# Patient Record
Sex: Male | Born: 1975 | Race: White | Hispanic: No | Marital: Married | Smoking: Former smoker
Health system: Southern US, Community
[De-identification: ages and names within clinical notes are randomized; demographics above are authoritative.]

## PROBLEM LIST (undated history)

## (undated) DIAGNOSIS — E559 Vitamin D deficiency, unspecified: Secondary | ICD-10-CM

## (undated) DIAGNOSIS — E785 Hyperlipidemia, unspecified: Secondary | ICD-10-CM

## (undated) DIAGNOSIS — E669 Obesity, unspecified: Secondary | ICD-10-CM

## (undated) DIAGNOSIS — R7303 Prediabetes: Secondary | ICD-10-CM

## (undated) DIAGNOSIS — I10 Essential (primary) hypertension: Secondary | ICD-10-CM

## (undated) DIAGNOSIS — E291 Testicular hypofunction: Secondary | ICD-10-CM

## (undated) HISTORY — DX: Prediabetes: R73.03

## (undated) HISTORY — PX: CERVICAL FUSION: SHX112

## (undated) HISTORY — DX: Vitamin D deficiency, unspecified: E55.9

## (undated) HISTORY — DX: Essential (primary) hypertension: I10

## (undated) HISTORY — DX: Testicular hypofunction: E29.1

## (undated) HISTORY — DX: Obesity, unspecified: E66.9

## (undated) HISTORY — DX: Hyperlipidemia, unspecified: E78.5

---

## 1998-11-29 ENCOUNTER — Emergency Department (HOSPITAL_COMMUNITY): Admission: EM | Admit: 1998-11-29 | Discharge: 1998-11-29 | Payer: Self-pay | Admitting: Internal Medicine

## 1998-11-29 ENCOUNTER — Encounter: Payer: Self-pay | Admitting: Internal Medicine

## 2008-05-17 ENCOUNTER — Emergency Department (HOSPITAL_COMMUNITY): Admission: EM | Admit: 2008-05-17 | Discharge: 2008-05-17 | Payer: Self-pay | Admitting: Emergency Medicine

## 2008-05-24 ENCOUNTER — Emergency Department (HOSPITAL_COMMUNITY): Admission: EM | Admit: 2008-05-24 | Discharge: 2008-05-24 | Payer: Self-pay | Admitting: Emergency Medicine

## 2008-06-14 ENCOUNTER — Emergency Department (HOSPITAL_COMMUNITY): Admission: EM | Admit: 2008-06-14 | Discharge: 2008-06-14 | Payer: Self-pay | Admitting: Emergency Medicine

## 2010-12-07 ENCOUNTER — Other Ambulatory Visit (HOSPITAL_COMMUNITY): Payer: Self-pay | Admitting: Internal Medicine

## 2010-12-07 ENCOUNTER — Ambulatory Visit (HOSPITAL_COMMUNITY)
Admission: RE | Admit: 2010-12-07 | Discharge: 2010-12-07 | Disposition: A | Discharge status: Home / Self Care | Payer: BC Managed Care – PPO | Source: Ambulatory Visit | Attending: Internal Medicine | Admitting: Internal Medicine

## 2010-12-07 DIAGNOSIS — Z0000 Encounter for general adult medical examination without abnormal findings: Secondary | ICD-10-CM | POA: Insufficient documentation

## 2010-12-07 DIAGNOSIS — I10 Essential (primary) hypertension: Secondary | ICD-10-CM

## 2010-12-07 DIAGNOSIS — F17200 Nicotine dependence, unspecified, uncomplicated: Secondary | ICD-10-CM | POA: Insufficient documentation

## 2011-03-29 LAB — DIFFERENTIAL
Basophils Relative: 1
Eosinophils Absolute: 0.4
Monocytes Absolute: 0.9
Monocytes Relative: 9
Neutro Abs: 6.1

## 2011-03-29 LAB — CBC
Hemoglobin: 16.6
MCHC: 34
MCV: 89.9
RBC: 5.44

## 2011-03-29 LAB — BASIC METABOLIC PANEL
CO2: 26
Chloride: 102
GFR calc Af Amer: >60
Sodium: 138

## 2011-03-29 LAB — URINALYSIS, ROUTINE W REFLEX MICROSCOPIC
Bilirubin Urine: NEGATIVE
Hgb urine dipstick: NEGATIVE
Specific Gravity, Urine: 1.027
Urobilinogen, UA: 0.2

## 2011-03-29 LAB — URINE MICROSCOPIC-ADD ON

## 2012-05-03 ENCOUNTER — Ambulatory Visit (HOSPITAL_COMMUNITY)
Admission: RE | Admit: 2012-05-03 | Discharge: 2012-05-03 | Disposition: A | Discharge status: Home / Self Care | Payer: BC Managed Care – PPO | Source: Ambulatory Visit | Attending: Internal Medicine | Admitting: Internal Medicine

## 2012-05-03 ENCOUNTER — Other Ambulatory Visit (HOSPITAL_COMMUNITY): Payer: Self-pay | Admitting: Internal Medicine

## 2012-05-03 DIAGNOSIS — M545 Low back pain, unspecified: Secondary | ICD-10-CM

## 2013-05-06 ENCOUNTER — Encounter: Payer: Self-pay | Admitting: Internal Medicine

## 2013-05-06 DIAGNOSIS — R7303 Prediabetes: Secondary | ICD-10-CM

## 2013-05-06 DIAGNOSIS — E785 Hyperlipidemia, unspecified: Secondary | ICD-10-CM

## 2013-05-06 DIAGNOSIS — R7309 Other abnormal glucose: Secondary | ICD-10-CM | POA: Insufficient documentation

## 2013-05-06 DIAGNOSIS — E559 Vitamin D deficiency, unspecified: Secondary | ICD-10-CM

## 2013-05-06 DIAGNOSIS — E291 Testicular hypofunction: Secondary | ICD-10-CM | POA: Insufficient documentation

## 2013-05-06 DIAGNOSIS — I10 Essential (primary) hypertension: Secondary | ICD-10-CM

## 2013-05-06 DIAGNOSIS — E669 Obesity, unspecified: Secondary | ICD-10-CM

## 2013-05-07 ENCOUNTER — Encounter: Payer: Self-pay | Admitting: Physician Assistant

## 2013-05-07 ENCOUNTER — Ambulatory Visit: Payer: BC Managed Care – PPO | Admitting: Physician Assistant

## 2013-05-07 VITALS — BP 128/82 | HR 68 | Temp 98.2°F | Resp 16 | Wt 205.4 lb

## 2013-05-07 DIAGNOSIS — I10 Essential (primary) hypertension: Secondary | ICD-10-CM

## 2013-05-07 DIAGNOSIS — E559 Vitamin D deficiency, unspecified: Secondary | ICD-10-CM

## 2013-05-07 DIAGNOSIS — E291 Testicular hypofunction: Secondary | ICD-10-CM

## 2013-05-07 DIAGNOSIS — E669 Obesity, unspecified: Secondary | ICD-10-CM

## 2013-05-07 DIAGNOSIS — E785 Hyperlipidemia, unspecified: Secondary | ICD-10-CM

## 2013-05-07 DIAGNOSIS — R7303 Prediabetes: Secondary | ICD-10-CM

## 2013-05-07 LAB — BASIC METABOLIC PANEL WITH GFR
CO2: 27 mEq/L (ref 19–32)
Chloride: 104 mEq/L (ref 96–112)
Glucose, Bld: 91 mg/dL (ref 70–99)
Potassium: 5.2 mEq/L (ref 3.5–5.3)
Sodium: 137 mEq/L (ref 135–145)

## 2013-05-07 LAB — CBC WITH DIFFERENTIAL/PLATELET
Hemoglobin: 15.5 g/dL (ref 13.0–17.0)
Lymphocytes Relative: 31 % (ref 12–46)
Lymphs Abs: 2.8 10*3/uL (ref 0.7–4.0)
MCH: 31.4 pg (ref 26.0–34.0)
Monocytes Relative: 10 % (ref 3–12)
Neutro Abs: 5.2 10*3/uL (ref 1.7–7.7)
Neutrophils Relative %: 56 % (ref 43–77)
RBC: 4.94 MIL/uL (ref 4.22–5.81)
WBC: 9.2 10*3/uL (ref 4.0–10.5)

## 2013-05-07 LAB — HEPATIC FUNCTION PANEL
ALT: 29 U/L (ref 0–53)
Albumin: 4.7 g/dL (ref 3.5–5.2)
Total Protein: 7.4 g/dL (ref 6.0–8.3)

## 2013-05-07 LAB — LIPID PANEL
Cholesterol: 186 mg/dL (ref 0–200)
HDL: 35 mg/dL — ABNORMAL LOW (ref >39)
Triglycerides: 479 mg/dL — ABNORMAL HIGH (ref <150)

## 2013-05-07 NOTE — Patient Instructions (Signed)
You AIC is in prediabetic range which is between 5.7 and 6.4. This is a warning sign for diabetes. Your A1C is a measure of your sugar over the past 3 months and is not affected by what you have eaten over the past few days. Diabetes increases your chances of stroke and heart attack over 300 % and is the leading cause of blindness and kidney failure in the [place]. Please make sure you decrease bad carbs like white bread, white rice, potatoes, corn, soft drinks, pasta, cereals, refined sugars, sweet tea, dried fruits, and fruit juice. Good carbs are okay to eat in moderation like sweet potatoes, brown rice, whole grain pasta/bread, most fruit (expect dried fruit) and you can eat as many veggies as you want.   Testosterone This test is used to determine if your testosterone level is abnormal. This could be used to explain difficulty getting an erection (erectile dysfunction), inability of your partner to get pregnant (infertility), premature or delayed puberty if you are male, or the appearance of masculine physical features if you are male. PREPARATION FOR TEST A blood sample is obtained by inserting a needle into a vein in the arm. NORMAL FINDINGS  Free Testosterone: 0.3-2 pg/mL  % Free Testosterone: 0.1%-0.3% Total Testosterone:  7 mos-9 yrs (Tanner Stage I)  Male: Less than 30 ng/dL  Male: Less than 30 ng/dL  16-10 yrs (Tanner Stage II)  Male: Less than 300 ng/dL  Male: Less than 40 ng/dL  96-04 yrs (Tanner Stage III)  Male: 170-540 ng/dL  Male: Less than 60 ng/dL  54-09 yrs (Tanner Stage IV, V)  Male: 250-910 ng/dL  Male: Less than 70 ng/dL  20 yrs and over  Male: [phone] ng/dL  Male: Less than 70 ng/dL Ranges for normal findings may vary among different laboratories and hospitals. You should always check with your doctor after having lab work or other tests done to discuss the meaning of your test results and whether your values are considered within  normal limits. MEANING OF TEST  Your caregiver will go over the test results with you and discuss the importance and meaning of your results, as well as treatment options and the need for additional tests if necessary. OBTAINING THE TEST RESULTS It is your responsibility to obtain your test results. Ask the lab or department performing the test when and how you will get your results. Document Released: 06/29/2004 Document Revised: 09/04/2011 Document Reviewed: 05/24/2008 [facility] Patient Information 2014 [place], [place].

## 2013-05-07 NOTE — Progress Notes (Addendum)
HPI Patient presents for 3 month follow up with hypertension, hyperlipidemia, prediabetes and vitamin D.  Patient's blood pressure has been controlled at home. Patient denies chest pain, shortness of breath, dizziness.  Patient's cholesterol is diet controlled and is on pravastatin and denies myalgias.  he has  been working on diet and exercise for prediabetes, denies changes in vision, polys, and paresthesias.   Patient is on Vitamin D supplement.  Surgery on Cervical spine 7 weeks ago and went back after two weeks. No more radicular pain.  Has been out of testosterone for 3 months and he can tell a difference in his energy level.  Current Medications:    Medication List       This list is accurate as of: 05/07/13  4:35 PM.  Always use your most recent med list.               cholecalciferol 1000 UNITS tablet  Commonly known as:  VITAMIN D  Take 10,000 Units by mouth daily.     fish oil-omega-3 fatty acids 1000 MG capsule  Take 2 g by mouth daily.     Flaxseed Oil 1000 MG Caps  Take by mouth.     lisinopril 40 MG tablet  Commonly known as:  PRINIVIL,ZESTRIL  Take 40 mg by mouth daily.     pravastatin 40 MG tablet  Commonly known as:  PRAVACHOL  Take 40 mg by mouth daily.     tadalafil 20 MG tablet  Commonly known as:  CIALIS  Take 20 mg by mouth daily as needed for erectile dysfunction.     testosterone cypionate 100 MG/ML injection  Commonly known as:  DEPOTESTOTERONE CYPIONATE  Inject 200 mg into the muscle as directed. For IM use only       Allergies: Allergies no known allergies  ROS Constitutional: Denies fever, chills, weight loss/gain, headaches, insomnia, fatigue, night sweats, and change in appetite. Eyes: Denies redness, blurred vision, diplopia, discharge, itchy, watery eyes.  ENT: Denies discharge, congestion, post nasal drip, sore throat, earache, dental pain, Tinnitus, Vertigo, Sinus pain, snoring.  Cardio: Denies chest pain, palpitations, irregular  heartbeat,  dyspnea, diaphoresis, orthopnea, PND, claudication, edema Respiratory: denies cough, dyspnea,pleurisy, hoarseness, wheezing.  Gastrointestinal: Denies dysphagia, heartburn,  water brash, pain, cramps, nausea, vomiting, bloating, diarrhea, constipation, hematemesis, melena, hematochezia,  hemorrhoids Genitourinary: Denies dysuria, frequency, urgency, nocturia, hesitancy, discharge, hematuria, flank pain Musculoskeletal: Denies arthralgia, myalgia, stiffness, Jt. Swelling, pain, limp, and strain/sprain. Skin: Denies puritis, rash, hives, warts, acne, eczema, changing in skin lesion Neuro: Weakness, tremor, incoordination, spasms, paresthesia, pain Psychiatric: Denies confusion, memory loss, sensory loss Endocrine: Denies change in weight, skin, hair change, nocturia, and paresthesia, Diabetic Polys, visual blurring, hyper /hypo glycemic episodes.  Heme/Lymph: Excessive bleeding, bruising, enlarged lymph nodes Medical History:  Past Medical History  Diagnosis Date  . Hypertension   . Hyperlipidemia   . Prediabetes   . Hypogonadism male   . Vitamin D deficiency   . Obesity    Family history- Review and unchanged Social history- Review and unchanged Physical Exam: There were no vitals filed for this visit. There were no vitals filed for this visit. General Appearance: Well nourished, in no apparent distress. Eyes: PERRLA, EOMs, conjunctiva no swelling or erythema, normal fundi and vessels. Sinuses: No Frontal/maxillary tenderness ENT/Mouth: Ext aud canals clear, with TMs without erythema, bulging.No erythema, swelling, or exudate on post pharynx.  Tonsils not swollen or erythematous. Hearing normal.  Neck: Supple, thyroid normal.  Respiratory: Respiratory effort normal, BS  equal bilaterally without rales, rhonci, wheezing or stridor.  Cardio: Heart sounds normal, regular rate and rhythm without murmurs, rubs or gallops. Peripheral pulses brisk and equal bilaterally, without  edema.  Abdomen: Flat, soft, with bowel sounds. Nontender, no guarding, rebound, hernias, masses, or organomegaly.  Lymphatics: Non tender without lymphadenopathy.  Musculoskeletal: Full ROM all peripheral extremities, joint stability, 5/5 strength, and normal gait. Skin: Warm, dry without rashes, lesions, ecchymosis.  Neuro: Cranial nerves intact, reflexes equal bilaterally. Normal muscle tone, no cerebellar symptoms. Sensation intact.  Pysch: Awake and oriented X 3, normal affect, Insight and Judgment appropriate.   Assessement and Plan:  Hypertension: Continue medication, monitor blood pressure at home. Continue DASH diet. Cholesterol: Continue diet and exercise. Check cholesterol. Has been off fenofibrate for one month will check Trigs.  Pre-diabetes-Continue diet and exercise.  Vitamin D Def- check level and continue medications.  Obesity- discussed weight loss Hypogondism- check levels if low call back in Dtest.   Quentin Mulling 4:35 PM   Patient's labs shows that his [place] are still elevated so we call him in Fenofibrate and his testosterone was low and he is having symptoms to that was also called in.

## 2013-05-08 LAB — TESTOSTERONE: Testosterone: 268 ng/dL — ABNORMAL LOW (ref 300–890)

## 2013-05-08 LAB — VITAMIN D 25 HYDROXY (VIT D DEFICIENCY, FRACTURES): Vit D, 25-Hydroxy: 72 ng/mL (ref 30–89)

## 2013-05-08 MED ORDER — TESTOSTERONE CYPIONATE 100 MG/ML IM SOLN: INTRAMUSCULAR | Status: DC

## 2013-05-08 MED ORDER — FENOFIBRATE MICRONIZED 134 MG PO CAPS: 134.0000 mg | ORAL_CAPSULE | Freq: Every day | ORAL | Status: DC

## 2013-05-08 NOTE — Addendum Note (Signed)
Addended by: Quentin Mulling R on: 05/08/2013 07:38 AM   Modules accepted: Orders

## 2013-05-12 ENCOUNTER — Other Ambulatory Visit: Payer: Self-pay | Admitting: Internal Medicine

## 2013-05-12 ENCOUNTER — Encounter: Payer: Self-pay | Admitting: Internal Medicine

## 2013-05-12 MED ORDER — TESTOSTERONE CYPIONATE 200 MG/ML IM SOLN: INTRAMUSCULAR | Status: DC

## 2013-07-02 ENCOUNTER — Encounter: Payer: Self-pay | Admitting: Internal Medicine

## 2013-07-02 ENCOUNTER — Ambulatory Visit (INDEPENDENT_AMBULATORY_CARE_PROVIDER_SITE_OTHER): Payer: BC Managed Care – PPO | Admitting: Internal Medicine

## 2013-07-02 VITALS — BP 108/68 | HR 76 | Temp 98.1°F | Resp 16 | Wt 204.8 lb

## 2013-07-02 DIAGNOSIS — E291 Testicular hypofunction: Secondary | ICD-10-CM

## 2013-07-02 DIAGNOSIS — E559 Vitamin D deficiency, unspecified: Secondary | ICD-10-CM

## 2013-07-02 DIAGNOSIS — E782 Mixed hyperlipidemia: Secondary | ICD-10-CM

## 2013-07-02 DIAGNOSIS — I10 Essential (primary) hypertension: Secondary | ICD-10-CM

## 2013-07-02 DIAGNOSIS — R7309 Other abnormal glucose: Secondary | ICD-10-CM

## 2013-07-02 DIAGNOSIS — Z79899 Other long term (current) drug therapy: Secondary | ICD-10-CM

## 2013-07-02 LAB — CBC WITH DIFFERENTIAL/PLATELET
Basophils Absolute: 0.1 10*3/uL (ref 0.0–0.1)
Basophils Relative: 1 % (ref 0–1)
EOS PCT: 2 % (ref 0–5)
Eosinophils Absolute: 0.1 10*3/uL (ref 0.0–0.7)
HEMATOCRIT: 45 % (ref 39.0–52.0)
Hemoglobin: 15.6 g/dL (ref 13.0–17.0)
LYMPHS ABS: 2.3 10*3/uL (ref 0.7–4.0)
LYMPHS PCT: 26 % (ref 12–46)
MCH: 30.4 pg (ref 26.0–34.0)
MCHC: 34.7 g/dL (ref 30.0–36.0)
MCV: 87.5 fL (ref 78.0–100.0)
MONO ABS: 1 10*3/uL (ref 0.1–1.0)
Monocytes Relative: 11 % (ref 3–12)
Neutro Abs: 5.3 10*3/uL (ref 1.7–7.7)
Neutrophils Relative %: 60 % (ref 43–77)
Platelets: 304 10*3/uL (ref 150–400)
RBC: 5.14 MIL/uL (ref 4.22–5.81)
RDW: 12.6 % (ref 11.5–15.5)
WBC: 8.7 10*3/uL (ref 4.0–10.5)

## 2013-07-02 NOTE — Patient Instructions (Signed)
Testosterone This test is used to determine if your testosterone level is abnormal. This could be used to explain difficulty getting an erection (erectile dysfunction), inability of your partner to get pregnant (infertility), premature or delayed puberty if you are male, or the appearance of masculine physical features if you are male. PREPARATION FOR TEST A blood sample is obtained by inserting a needle into a vein in the arm. NORMAL FINDINGS  Free Testosterone: 0.3-2 pg/mL  % Free Testosterone: 0.1%-0.3% Total Testosterone:  7 mos-9 yrs (Tanner Stage I)  Male: Less than 30 ng/dL  Male: Less than 30 ng/dL  10-13 yrs (Tanner Stage II)  Male: Less than 300 ng/dL  Male: Less than 40 ng/dL  14-15 yrs (Tanner Stage III)  Male: 170-540 ng/dL  Male: Less than 60 ng/dL  16-19 yrs (Tanner Stage IV, V)  Male: 250-910 ng/dL  Male: Less than 70 ng/dL  20 yrs and over  Male: [phone] ng/dL  Male: Less than 70 ng/dL Ranges for normal findings may vary among different laboratories and hospitals. You should always check with your doctor after having lab work or other tests done to discuss the meaning of your test results and whether your values are considered within normal limits. MEANING OF TEST  Your caregiver will go over the test results with you and discuss the importance and meaning of your results, as well as treatment options and the need for additional tests if necessary. OBTAINING THE TEST RESULTS It is your responsibility to obtain your test results. Ask the lab or department performing the test when and how you will get your results. Document Released: 06/29/2004 Document Revised: 09/04/2011 Document Reviewed: 05/24/2008 [facility] Patient Information 2014 [place], [place].  Testosterone injection What is this medicine? TESTOSTERONE (tes TOS ter one) is the main male hormone. It supports normal male development such as muscle growth, facial hair, and deep  voice. It is used in males to treat low testosterone levels. This medicine may be used for other purposes; ask your health care provider or pharmacist if you have questions. COMMON BRAND NAME(S): Andro-L.A., Aveed, Delatestryl, Depo-Testosterone, Virilon What should I tell my health care provider before I take this medicine? They need to know if you have any of these conditions: -breast cancer -diabetes -heart disease -kidney disease -liver disease -lung disease -prostate cancer, enlargement -an unusual or allergic reaction to testosterone, other medicines, foods, dyes, or preservatives -pregnant or trying to get pregnant -breast-feeding How should I use this medicine? This medicine is for injection into a muscle. It is usually given by a health care professional in a hospital or clinic setting. Contact your pediatrician regarding the use of this medicine in children. While this medicine may be prescribed for children as young as 17 years of age for selected conditions, precautions do apply. Overdosage: If you think you have taken too much of this medicine contact a poison control center or emergency room at once. NOTE: This medicine is only for you. Do not share this medicine with others. What if I miss a dose? Try not to miss a dose. Your doctor or health care professional will tell you when your next injection is due. Notify the office if you are unable to keep an appointment. What may interact with this medicine? -medicines for diabetes -medicines that treat or prevent blood clots like warfarin -oxyphenbutazone -propranolol -steroid medicines like prednisone or cortisone This list may not describe all possible interactions. Give your health care provider a list of all the medicines, herbs,  non-prescription drugs, or dietary supplements you use. Also tell them if you smoke, drink alcohol, or use illegal drugs. Some items may interact with your medicine. What should I watch for while  using this medicine? Visit your doctor or health care professional for regular checks on your progress. They will need to check the level of testosterone in your blood. This medicine may affect blood sugar levels. If you have diabetes, check with your doctor or health care professional before you change your diet or the dose of your diabetic medicine. This drug is banned from use in athletes by most athletic organizations. What side effects may I notice from receiving this medicine? Side effects that you should report to your doctor or health care professional as soon as possible: -allergic reactions like skin rash, itching or hives, swelling of the face, lips, or tongue -breast enlargement -breathing problems -changes in mood, especially anger, depression, or rage -dark urine -general ill feeling or flu-like symptoms -light-colored stools -loss of appetite, nausea -nausea, vomiting -right upper belly pain -stomach pain -swelling of ankles -too frequent or persistent erections -trouble passing urine or change in the amount of urine -unusually weak or tired -yellowing of the eyes or skin Additional side effects that can occur in women include: -deep or hoarse voice -facial hair growth -irregular menstrual periods Side effects that usually do not require medical attention (report to your doctor or health care professional if they continue or are bothersome): -acne -change in sex drive or performance -hair loss -headache This list may not describe all possible side effects. Call your doctor for medical advice about side effects. You may report side effects to FDA at 1-800-FDA-1088. Where should I keep my medicine? Keep out of the reach of children. This medicine can be abused. Keep your medicine in a safe place to protect it from theft. Do not share this medicine with anyone. Selling or giving away this medicine is dangerous and against the law. Store at room temperature between 20 and  25 degrees C (68 and 77 degrees F). Do not freeze. Protect from light. Follow the directions for the product you are prescribed. Throw away any unused medicine after the expiration date. NOTE: This sheet is a summary. It may not cover all possible information. If you have questions about this medicine, talk to your doctor, pharmacist, or health care provider.  2014, Elsevier/Gold Standard. (2007-08-23 16:13:46)  Hypertension As your heart beats, it forces blood through your arteries. This force is your blood pressure. If the pressure is too high, it is called hypertension (HTN) or high blood pressure. HTN is dangerous because you may have it and not know it. High blood pressure may mean that your heart has to work harder to pump blood. Your arteries may be narrow or stiff. The extra work puts you at risk for heart disease, stroke, and other problems.  Blood pressure consists of two numbers, a higher number over a lower, 110/72, for example. It is stated as "110 over 72." The ideal is below 120 for the top number (systolic) and under 80 for the bottom (diastolic). Write down your blood pressure today. You should pay close attention to your blood pressure if you have certain conditions such as:  Heart failure.  Prior heart attack.  Diabetes  Chronic kidney disease.  Prior stroke.  Multiple risk factors for heart disease. To see if you have HTN, your blood pressure should be measured while you are seated with your arm held at the level  of the heart. It should be measured at least twice. A one-time elevated blood pressure reading (especially in the Emergency Department) does not mean that you need treatment. There may be conditions in which the blood pressure is different between your right and left arms. It is important to see your caregiver soon for a recheck. Most people have essential hypertension which means that there is not a specific cause. This type of high blood pressure may be lowered by  changing lifestyle factors such as:  Stress.  Smoking.  Lack of exercise.  Excessive weight.  Drug/tobacco/alcohol use.  Eating less salt. Most people do not have symptoms from high blood pressure until it has caused damage to the body. Effective treatment can often prevent, delay or reduce that damage. TREATMENT  When a cause has been identified, treatment for high blood pressure is directed at the cause. There are a large number of medications to treat HTN. These fall into several categories, and your caregiver will help you select the medicines that are best for you. Medications may have side effects. You should review side effects with your caregiver. If your blood pressure stays high after you have made lifestyle changes or started on medicines,   Your medication(s) may need to be changed.  Other problems may need to be addressed.  Be certain you understand your prescriptions, and know how and when to take your medicine.  Be sure to follow up with your caregiver within the time frame advised (usually within two weeks) to have your blood pressure rechecked and to review your medications.  If you are taking more than one medicine to lower your blood pressure, make sure you know how and at what times they should be taken. Taking two medicines at the same time can result in blood pressure that is too low. SEEK IMMEDIATE MEDICAL CARE IF:  You develop a severe headache, blurred or changing vision, or confusion.  You have unusual weakness or numbness, or a faint feeling.  You have severe chest or abdominal pain, vomiting, or breathing problems. MAKE SURE YOU:   Understand these instructions.  Will watch your condition.  Will get help right away if you are not doing well or get worse. Document Released: 06/12/2005 Document Revised: 09/04/2011 Document Reviewed: 01/31/2008 [facility] Patient Information 2014 [place].  Diabetes and Exercise Exercising regularly is  important. It is not just about losing weight. It has many health benefits, such as:  Improving your overall fitness, flexibility, and endurance.  Increasing your bone density.  Helping with weight control.  Decreasing your body fat.  Increasing your muscle strength.  Reducing stress and tension.  Improving your overall health. People with diabetes who exercise gain additional benefits because exercise:  Reduces appetite.  Improves the body's use of blood sugar (glucose).  Helps lower or control blood glucose.  Decreases blood pressure.  Helps control blood lipids (such as cholesterol and triglycerides).  Improves the body's use of the hormone insulin by:  Increasing the body's insulin sensitivity.  Reducing the body's insulin needs.  Decreases the risk for heart disease because exercising:  Lowers cholesterol and triglycerides levels.  Increases the levels of good cholesterol (such as high-density lipoproteins [HDL]) in the body.  Lowers blood glucose levels. YOUR ACTIVITY PLAN  Choose an activity that you enjoy and set realistic goals. Your health care provider or diabetes educator can help you make an activity plan that works for you. You can break activities into 2 or 3 sessions throughout the  day. Doing so is as good as one long session. Exercise ideas include:  Taking the dog for a walk.  Taking the stairs instead of the elevator.  Dancing to your favorite song.  Doing your favorite exercise with a friend. RECOMMENDATIONS FOR EXERCISING WITH TYPE 1 OR TYPE 2 DIABETES   Check your blood glucose before exercising. If blood glucose levels are greater than 240 mg/dL, check for urine ketones. Do not exercise if ketones are present.  Avoid injecting insulin into areas of the body that are going to be exercised. For example, avoid injecting insulin into:  The arms when playing tennis.  The legs when jogging.  Keep a record of:  Food intake before and after  you exercise.  Expected peak times of insulin action.  Blood glucose levels before and after you exercise.  The type and amount of exercise you have done.  Review your records with your health care provider. Your health care provider will help you to develop guidelines for adjusting food intake and insulin amounts before and after exercising.  If you take insulin or oral hypoglycemic agents, watch for signs and symptoms of hypoglycemia. They include:  Dizziness.  Shaking.  Sweating.  Chills.  Confusion.  Drink plenty of water while you exercise to prevent dehydration or heat stroke. Body water is lost during exercise and must be replaced.  Talk to your health care provider before starting an exercise program to make sure it is safe for you. Remember, almost any type of activity is better than none. Document Released: 09/02/2003 Document Revised: 02/12/2013 Document Reviewed: 11/19/2012 [facility] Patient Information 2014 [place], [place].  Cholesterol Cholesterol is a white, waxy, fat-like protein needed by your body in small amounts. The liver makes all the cholesterol you need. It is carried from the liver by the blood through the blood vessels. Deposits (plaque) may build up on blood vessel walls. This makes the arteries narrower and stiffer. Plaque increases the risk for heart attack and stroke. You cannot feel your cholesterol level even if it is very high. The only way to know is by a blood test to check your lipid (fats) levels. Once you know your cholesterol levels, you should keep a record of the test results. Work with your caregiver to to keep your levels in the desired range. WHAT THE RESULTS MEAN:  Total cholesterol is a rough measure of all the cholesterol in your blood.  LDL is the so-called bad cholesterol. This is the type that deposits cholesterol in the walls of the arteries. You want this level to be low.  HDL is the good cholesterol because it cleans the  arteries and carries the LDL away. You want this level to be high.  Triglycerides are fat that the body can either burn for energy or store. High levels are closely linked to heart disease. DESIRED LEVELS:  Total cholesterol below 200.  LDL below 100 for people at risk, below 70 for very high risk.  HDL above 50 is good, above 60 is best.  Triglycerides below 150. HOW TO LOWER YOUR CHOLESTEROL:  Diet.  Choose fish or white meat chicken and [place], roasted or baked. Limit fatty cuts of red meat, fried foods, and processed meats, such as sausage and lunch meat.  Eat lots of fresh fruits and vegetables. Choose whole grains, beans, pasta, potatoes and cereals.  Use only small amounts of olive, corn or canola oils. Avoid butter, mayonnaise, shortening or palm kernel oils. Avoid foods with trans-fats.  Use  skim/nonfat milk and low-fat/nonfat yogurt and cheeses. Avoid whole milk, cream, ice cream, egg yolks and cheeses. Healthy desserts include angel food cake, ginger snaps, animal crackers, hard candy, popsicles, and low-fat/nonfat frozen yogurt. Avoid pastries, cakes, pies and cookies.  Exercise.  A regular program helps decrease LDL and raises HDL.  Helps with weight control.  Do things that increase your activity level like gardening, walking, or taking the stairs.  Medication.  May be prescribed by your caregiver to help lowering cholesterol and the risk for heart disease.  You may need medicine even if your levels are normal if you have several risk factors. HOME CARE INSTRUCTIONS   Follow your diet and exercise programs as suggested by your caregiver.  Take medications as directed.  Have blood work done when your caregiver feels it is necessary. MAKE SURE YOU:   Understand these instructions.  Will watch your condition.  Will get help right away if you are not doing well or get worse. Document Released: 03/07/2001 Document Revised: 09/04/2011 Document Reviewed:  08/28/2007 [facility] Patient Information 2014 [place], [place].  Vitamin D Deficiency Vitamin D is an important vitamin that your body needs. Having too little of it in your body is called a deficiency. A very bad deficiency can make your bones soft and can cause a condition called rickets.  Vitamin D is important to your body for different reasons, such as:   It helps your body absorb 2 minerals called calcium and phosphorus.  It helps make your bones healthy.  It may prevent some diseases, such as diabetes and multiple sclerosis.  It helps your muscles and heart. You can get vitamin D in several ways. It is a natural part of some foods. The vitamin is also added to some dairy products and cereals. Some people take vitamin D supplements. Also, your body makes vitamin D when you are in the sun. It changes the sun's rays into a form of the vitamin that your body can use. CAUSES   Not eating enough foods that contain vitamin D.  Not getting enough sunlight.  Having certain digestive system diseases that make it hard to absorb vitamin D. These diseases include Crohn's disease, chronic pancreatitis, and cystic fibrosis.  Having a surgery in which part of the stomach or small intestine is removed.  Being obese. Fat cells pull vitamin D out of your blood. That means that obese people may not have enough vitamin D left in their blood and in other body tissues.  Having chronic kidney or liver disease. RISK FACTORS Risk factors are things that make you more likely to develop a vitamin D deficiency. They include:  Being older.  Not being able to get outside very much.  Living in a nursing home.  Having had broken bones.  Having weak or thin bones (osteoporosis).  Having a disease or condition that changes how your body absorbs vitamin D.  Having dark skin.  Some medicines such as seizure medicines or steroids.  Being overweight or obese. SYMPTOMS Mild cases of vitamin D  deficiency may not have any symptoms. If you have a very bad case, symptoms may include:  Bone pain.  Muscle pain.  Falling often.  Broken bones caused by a minor injury, due to osteoporosis. DIAGNOSIS A blood test is the best way to tell if you have a vitamin D deficiency. TREATMENT Vitamin D deficiency can be treated in different ways. Treatment for vitamin D deficiency depends on what is causing it. Options include:  Taking  vitamin D supplements.  Taking a calcium supplement. Your caregiver will suggest what dose is best for you. HOME CARE INSTRUCTIONS  Take any supplements that your caregiver prescribes. Follow the directions carefully. Take only the suggested amount.  Have your blood tested 2 months after you start taking supplements.  Eat foods that contain vitamin D. Healthy choices include:  Fortified dairy products, cereals, or juices. Fortified means vitamin D has been added to the food. Check the label on the package to be sure.  Fatty fish like salmon or trout.  Eggs.  Oysters.  Do not use a tanning bed.  Keep your weight at a healthy level. Lose weight if you need to.  Keep all follow-up appointments. Your caregiver will need to perform blood tests to make sure your vitamin D deficiency is going away. SEEK MEDICAL CARE IF:  You have any questions about your treatment.  You continue to have symptoms of vitamin D deficiency.  You have nausea or vomiting.  You are constipated.  You feel confused.  You have severe abdominal or back pain. MAKE SURE YOU:  Understand these instructions.  Will watch your condition.  Will get help right away if you are not doing well or get worse. Document Released: 09/04/2011 Document Revised: 10/07/2012 Document Reviewed: 09/04/2011 [facility] Patient Information 2014 [place].

## 2013-07-02 NOTE — Progress Notes (Signed)
Patient ID: Neil Henderson, male   DOB: 1976-03-25, 38 y.o.   MRN: [id number]   This very nice 38 y.o. MWM presents for 3 month follow up with Hypertension, Hyperlipidemia, Testosterone Deficiency, Pre-Diabetes and Vitamin D Deficiency.    His labile HTN predates since 2007 and is being monitored expectantly. Today's BP: 108/68 mmHg . Patient denies any cardiac type chest pain, palpitations, dyspnea/orthopnea/PND, dizziness, claudication, or dependent edema.   Hyperlipidemia is controlled with diet & meds. Last Cholesterol was 154, Triglycerides were 209, HDL 30 and LDL 82 - at goal in June. Patient denies myalgias or other med SE's.    Patient has Testosterone Deficiency and is on parenteral replacement therapy and reports improved sense of well- being on therapy. He does report he has been off treatment about 6 weeks.   Also, the patient has history of PreDiabetes with A1c 5.7% in March 2013 and with last A1c of 5.1% in June 2014. Patient denies any symptoms of reactive hypoglycemia, diabetic polys, paresthesias or visual blurring.   Further, Patient has history of Vitamin D Deficiency of 13 in 2008 with last vitamin D of 89 in June 2014. Patient supplements Vitamin D without any suspected side-effects.  Medication Sig Dispense Refill  . cholecalciferol (VITAMIN D) 1000 UNITS tablet Take 10,000 Units by mouth daily.      . fenofibrate micronized (LOFIBRA) 134 MG capsule Take 1 capsule (134 mg total) by mouth daily before breakfast.  90 capsule  1  . lisinopril (PRINIVIL,ZESTRIL) 40 MG tablet Take 40 mg by mouth daily.      . pravastatin (PRAVACHOL) 40 MG tablet Take 40 mg by mouth daily.      . tadalafil (CIALIS) 20 MG tablet Take 20 mg by mouth daily as needed for erectile dysfunction.      Marland Kitchen testosterone cypionate (DEPO-TESTOSTERONE) 200 MG/ML injection 2 cc IM  Q2wk.s  And 3cc syringes and 1"  21 G. needles  10 mL  5     No Known Allergies  PMHx:   Past Medical History  Diagnosis Date   . Hypertension   . Hyperlipidemia   . Prediabetes   . Hypogonadism male   . Vitamin D deficiency   . Obesity     FHx:    Reviewed / unchanged  SHx:    Reviewed / unchanged  Systems Review: Constitutional: Denies fever, chills, wt changes, headaches, insomnia, fatigue, night sweats, change in appetite. Eyes: Denies redness, blurred vision, diplopia, discharge, itchy, watery eyes.  ENT: Denies discharge, congestion, post nasal drip, epistaxis, sore throat, earache, hearing loss, dental pain, tinnitus, vertigo, sinus pain, snoring.  CV: Denies chest pain, palpitations, irregular heartbeat, syncope, dyspnea, diaphoresis, orthopnea, PND, claudication, edema. Respiratory: denies cough, dyspnea, DOE, pleurisy, hoarseness, laryngitis, wheezing.  Gastrointestinal: Denies dysphagia, odynophagia, heartburn, reflux, water brash, abdominal pain or cramps, nausea, vomiting, bloating, diarrhea, constipation, hematemesis, melena, hematochezia,  or hemorrhoids. Genitourinary: Denies dysuria, frequency, urgency, nocturia, hesitancy, discharge, hematuria, flank pain. Musculoskeletal: Denies arthralgias, myalgias, stiffness, jt. swelling, pain, limp, strain/sprain.  Skin: Denies pruritus, rash, hives, warts, acne, eczema, change in skin lesion(s). Neuro: No weakness, tremor, incoordination, spasms, paresthesia, or pain. Psychiatric: Denies confusion, memory loss, or sensory loss. Endo: Denies change in weight, skin, hair change.  Heme/Lymph: No excessive bleeding, bruising, orenlarged lymph nodes.  BP: 108/68  Pulse: 76  Temp: 98.1 F (36.7 C)  Resp: 16   On Exam: Appears well nourished - in no distress. Eyes: PERRLA, EOMs, conjunctiva no swelling or erythema. Sinuses:  No frontal/maxillary tenderness ENT/Mouth: EAC's clear, TM's nl w/o erythema, bulging. Nares clear w/o erythema, swelling, exudates. Oropharynx clear without erythema or exudates. Oral hygiene is good. Tongue normal, non  obstructing. Hearing intact.  Neck: Supple. Thyroid nl. Car 2+/2+ without bruits, nodes or JVD. Chest: Respirations nl with BS clear & equal w/o rales, rhonchi, wheezing or stridor.  Cor: Heart sounds normal w/ regular rate and rhythm without sig. murmurs, gallops, clicks, or rubs. Peripheral pulses normal and equal  without edema.  Abdomen: Soft & bowel sounds normal. Non-tender w/o guarding, rebound, hernias, masses, or organomegaly.  Lymphatics: Unremarkable.  Musculoskeletal: Full ROM all peripheral extremities, joint stability, 5/5 strength, and normal gait.  Skin: Warm, dry without exposed rashes, lesions, ecchymosis apparent.  Neuro: Cranial nerves intact, reflexes equal bilaterally. Sensory-motor testing grossly intact. Tendon reflexes grossly intact.  Pysch: Alert & oriented x 3. Insight and judgement nl & appropriate. No ideations.  Assessment and Plan:  1. Hypertension - Continue monitor blood pressure at home. Continue diet & exercise same.  2. Hyperlipidemia - Continue diet/meds, exercise,& lifestyle modifications. Continue monitor periodic cholesterol/liver & renal functions   3. Pre-diabetes - Continue diet, exercise, lifestyle modifications. Monitor appropriate labs.  4. Vitamin D Deficiency - Continue supplementation.  5. Testosterone Deficiency  Recommended regular exercise, BP monitoring, weight control, and discussed med and SE's. Recommended labs to assess and monitor clinical status. Further disposition pending results of labs.

## 2013-07-03 LAB — MAGNESIUM: MAGNESIUM: 2 mg/dL (ref 1.5–2.5)

## 2013-07-03 LAB — LIPID PANEL
CHOL/HDL RATIO: 4.4 ratio
Cholesterol: 182 mg/dL (ref 0–200)
HDL: 41 mg/dL (ref >39)
LDL Cholesterol: 99 mg/dL (ref 0–99)
Triglycerides: 208 mg/dL — ABNORMAL HIGH (ref <150)
VLDL: 42 mg/dL — ABNORMAL HIGH (ref 0–40)

## 2013-07-03 LAB — HEMOGLOBIN A1C
HEMOGLOBIN A1C: 5.5 % (ref <5.7)
Mean Plasma Glucose: 111 mg/dL (ref <117)

## 2013-07-03 LAB — BASIC METABOLIC PANEL WITH GFR
BUN: 18 mg/dL (ref 6–23)
CHLORIDE: 105 meq/L (ref 96–112)
CO2: 27 meq/L (ref 19–32)
CREATININE: 1.08 mg/dL (ref 0.50–1.35)
Calcium: 10.3 mg/dL (ref 8.4–10.5)
GFR, Est African American: >89 mL/min
GFR, Est Non African American: 86 mL/min
Glucose, Bld: 84 mg/dL (ref 70–99)
Potassium: 4.5 mEq/L (ref 3.5–5.3)
SODIUM: 139 meq/L (ref 135–145)

## 2013-07-03 LAB — HEPATIC FUNCTION PANEL
ALBUMIN: 4.6 g/dL (ref 3.5–5.2)
ALK PHOS: 54 U/L (ref 39–117)
ALT: 33 U/L (ref 0–53)
AST: 23 U/L (ref 0–37)
BILIRUBIN TOTAL: 0.4 mg/dL (ref 0.3–1.2)
Bilirubin, Direct: 0.1 mg/dL (ref 0.0–0.3)
Indirect Bilirubin: 0.3 mg/dL (ref 0.0–0.9)
Total Protein: 7 g/dL (ref 6.0–8.3)

## 2013-07-03 LAB — TSH: TSH: 1.522 u[IU]/mL (ref 0.350–4.500)

## 2013-07-03 LAB — INSULIN, FASTING: INSULIN FASTING, SERUM: 37 u[IU]/mL — AB (ref 3–28)

## 2013-07-03 LAB — VITAMIN D 25 HYDROXY (VIT D DEFICIENCY, FRACTURES): VIT D 25 HYDROXY: 67 ng/mL (ref 30–89)

## 2013-08-23 ENCOUNTER — Other Ambulatory Visit: Payer: Self-pay | Admitting: Physician Assistant

## 2013-08-25 ENCOUNTER — Other Ambulatory Visit: Payer: Self-pay

## 2013-08-25 MED ORDER — TESTOSTERONE CYPIONATE 200 MG/ML IM SOLN: INTRAMUSCULAR | Status: DC

## 2013-10-01 ENCOUNTER — Ambulatory Visit (INDEPENDENT_AMBULATORY_CARE_PROVIDER_SITE_OTHER): Payer: BC Managed Care – PPO | Admitting: Emergency Medicine

## 2013-10-01 ENCOUNTER — Encounter: Payer: Self-pay | Admitting: Emergency Medicine

## 2013-10-01 VITALS — BP 124/62 | HR 84 | Temp 98.2°F | Resp 18 | Ht 69.0 in | Wt 211.0 lb

## 2013-10-01 DIAGNOSIS — R51 Headache: Secondary | ICD-10-CM

## 2013-10-01 DIAGNOSIS — I10 Essential (primary) hypertension: Secondary | ICD-10-CM

## 2013-10-01 DIAGNOSIS — F17200 Nicotine dependence, unspecified, uncomplicated: Secondary | ICD-10-CM

## 2013-10-01 DIAGNOSIS — E782 Mixed hyperlipidemia: Secondary | ICD-10-CM

## 2013-10-01 DIAGNOSIS — R7309 Other abnormal glucose: Secondary | ICD-10-CM

## 2013-10-01 LAB — CBC WITH DIFFERENTIAL/PLATELET
BASOS ABS: 0.1 10*3/uL (ref 0.0–0.1)
BASOS PCT: 1 % (ref 0–1)
EOS ABS: 0.2 10*3/uL (ref 0.0–0.7)
Eosinophils Relative: 2 % (ref 0–5)
HCT: 44.2 % (ref 39.0–52.0)
Hemoglobin: 14.8 g/dL (ref 13.0–17.0)
LYMPHS ABS: 2.4 10*3/uL (ref 0.7–4.0)
Lymphocytes Relative: 28 % (ref 12–46)
MCH: 29.9 pg (ref 26.0–34.0)
MCHC: 33.5 g/dL (ref 30.0–36.0)
MCV: 89.3 fL (ref 78.0–100.0)
Monocytes Absolute: 0.9 10*3/uL (ref 0.1–1.0)
Monocytes Relative: 10 % (ref 3–12)
NEUTROS PCT: 59 % (ref 43–77)
Neutro Abs: 5.1 10*3/uL (ref 1.7–7.7)
PLATELETS: 327 10*3/uL (ref 150–400)
RBC: 4.95 MIL/uL (ref 4.22–5.81)
RDW: 12.9 % (ref 11.5–15.5)
WBC: 8.6 10*3/uL (ref 4.0–10.5)

## 2013-10-01 MED ORDER — VARENICLINE TARTRATE 1 MG PO TABS: 1.0000 mg | ORAL_TABLET | Freq: Two times a day (BID) | ORAL | Status: DC

## 2013-10-01 NOTE — Patient Instructions (Signed)
Headache and Arthritis °Headaches and arthritis are common problems. This causes an interest in the possible role of arthritis in causing headaches. Several major forms of arthritis exist. Two of the most common types are: °· Rheumatoid arthritis. °· Osteoarthritis. °Rheumatoid arthritis may begin at any age. It is a condition in which the body attacks some of its own tissues, thinking they do not belong. This leads to destruction of the bony areas around the joints. This condition may afflict any of the body's joints. It usually produces a deformity of the joint. The hands and fingers no longer appear straight but often appear angled towards one side. In some cases, the spine may be involved. Most often it is the vertebrae of the neck (cervicalspine). The areas of the neck most commonly afflicted by rheumatoid arthritis are the first and second cervical vertebrae. Curiously, rheumatoid arthritis, though it often produces severe deformities, is not always painful.  °The more common form of arthritis is osteoarthritis. It is a wear-and-tear form of arthritis. It usually does not produce deformity of the joints or destruction of the bony tissues. Rather the ligaments weaken. They may be calcified due to the body's attempt to heal the damage. The larger joints of the body and those joints that take the most stress and strain are the most often affected. In the neck region this osteoarthritis usually involves the fifth, sixth and seventh vertebrae. This is because the effects of posture produce the most fatigue on them. Osteoarthritis is often more painful than rheumatoid arthritis.  °During workups for arthritis, a test evaluating inflammation, (the sedimentation rate) often is performed. In rheumatoid arthritis, this test will usually be elevated. Other tests for inflammation may also be elevated. In patients with osteoarthritis, x-rays of the neck or jaw joints will show changes from "lipping" of the vertebrae. This  is caused by calcium deposits in the ligaments. Or they may show narrowing of the space between the vertebrae, or spur formation (from calcium deposits). If severe, it may cause obstruction of the holes where the nerves pass from the spine to the body. In rheumatoid arthritis, dislocation of vertebrae may occur in the upper neck. CT scan and MRI in patients with osteoarthritis may show bulging of the discs that cushion the vertebrae. In the most severe cases, herniation of the discs may occur.  °Headaches, felt as a pain in the neck, may be caused by arthritis if the first, second or third vertebrae are involved. This condition is due to the nerves that supply the scalp only originating from this area of the spine. Neck pain itself, whether alone or coupled with headaches, can involve any portion of the neck. If the jaw is involved, the symptoms are similar to those of Temporomandibular Joint Syndrome (TMJ).  °The progressive severity of rheumatoid arthritis may be slowed by a variety of potent medications. In osteoarthritis, its progression is not usually hindered by medication. The following may be helpful in slowing the advancement of the disorder: °· Lifestyle adjustment. °· Exercise. °· Rest. °· Weight loss. °Medications, such as the nonsteroidal anti-inflammatory agents (NSAIDs), are useful. They may reduce the pain and improve the reduced motion which occurs in joints afflicted by arthritis. From some studies, the use of acetaminophen appears to be as effective in controlling the pain of arthritis as the NSAIDs. Physical modalities may also be useful for arthritis. They include: °· Heat. °· Massage. °· Exercise. °But physical therapy must be prescribed by a caregiver, just as most medications for   arthritis.  Document Released: 09/02/2003 Document Revised: 09/04/2011 Document Reviewed: 01/30/2008 [facility] Patient Information 2014 [place], [place]. Smoking Cessation, Tips for Success If you are ready to quit  smoking, congratulations! You have chosen to help yourself be healthier. Cigarettes bring nicotine, tar, carbon monoxide, and other irritants into your body. Your lungs, heart, and blood vessels will be able to work better without these poisons. There are many different ways to quit smoking. Nicotine gum, nicotine patches, a nicotine inhaler, or nicotine nasal spray can help with physical craving. Hypnosis, support groups, and medicines help break the habit of smoking. WHAT THINGS CAN I DO TO MAKE QUITTING EASIER?  Here are some tips to help you quit for good:  Pick a date when you will quit smoking completely. Tell all of your friends and family about your plan to quit on that date.  Do not try to slowly cut down on the number of cigarettes you are smoking. Pick a quit date and quit smoking completely starting on that day.  Throw away all cigarettes.   Clean and remove all ashtrays from your home, work, and car.   On a card, write down your reasons for quitting. Carry the card with you and read it when you get the urge to smoke.   Cleanse your body of nicotine. Drink enough water and fluids to keep your urine clear or pale yellow. Do this after quitting to flush the nicotine from your body.   Learn to predict your moods. Do not let a bad situation be your excuse to have a cigarette. Some situations in your life might tempt you into wanting a cigarette.   Never have "just one" cigarette. It leads to wanting another and another. Remind yourself of your decision to quit.   Change habits associated with smoking. If you smoked while driving or when feeling stressed, try other activities to replace smoking. Stand up when drinking your coffee. Brush your teeth after eating. Sit in a different chair when you read the paper. Avoid alcohol while trying to quit, and try to drink fewer caffeinated beverages. Alcohol and caffeine may urge you to smoke.   Avoid foods and drinks that can trigger a  desire to smoke, such as sugary or spicy foods and alcohol.   Ask people who smoke not to smoke around you.   Have something planned to do right after eating or having a cup of coffee. For example, plan to take a walk or exercise.   Try a relaxation exercise to calm you down and decrease your stress. Remember, you may be tense and nervous for the first 2 weeks after you quit, but this will pass.   Find new activities to keep your hands busy. Play with a pen, coin, or rubber band. Doodle or draw things on paper.   Brush your teeth right after eating. This will help cut down on the craving for the taste of tobacco after meals. You can also try mouthwash.   Use oral substitutes in place of cigarettes. Try using lemon drops, carrots, cinnamon sticks, or chewing gum. Keep them handy so they are available when you have the urge to smoke.   When you have the urge to smoke, try deep breathing.   Designate your home as a nonsmoking area.   If you are a heavy smoker, ask your health care provider about a prescription for nicotine chewing gum. It can ease your withdrawal from nicotine.   Reward yourself. Set aside the cigarette money you  save and buy yourself something nice.   Look for support from others. Join a support group or smoking cessation program. Ask someone at home or at work to help you with your plan to quit smoking.   Always ask yourself, "Do I need this cigarette or is this just a reflex?" Tell yourself, "Today, I choose not to smoke," or "I do not want to smoke." You are reminding yourself of your decision to quit.  Do not replace cigarette smoking with electronic cigarettes (commonly called e-cigarettes). The safety of e-cigarettes is unknown, and some may contain harmful chemicals.  If you relapse, do not give up! Plan ahead and think about what you will do the next time you get the urge to smoke.  HOW WILL I FEEL WHEN I QUIT SMOKING? You may have symptoms of  withdrawal because your body is used to nicotine (the addictive substance in cigarettes). You may crave cigarettes, be irritable, feel very hungry, cough often, get headaches, or have difficulty concentrating. The withdrawal symptoms are only temporary. They are strongest when you first quit but will go away within 10 14 days. When withdrawal symptoms occur, stay in control. Think about your reasons for quitting. Remind yourself that these are signs that your body is healing and getting used to being without cigarettes. Remember that withdrawal symptoms are easier to treat than the major diseases that smoking can cause.  Even after the withdrawal is over, expect periodic urges to smoke. However, these cravings are generally short lived and will go away whether you smoke or not. Do not smoke!  WHAT RESOURCES ARE AVAILABLE TO HELP ME QUIT SMOKING? Your health care provider can direct you to community resources or hospitals for support, which may include:  Group support.  Education.  Hypnosis.  Therapy. Document Released: 03/10/2004 Document Revised: 04/02/2013 Document Reviewed: 11/28/2012 [facility] Patient Information 2014 [place], [place].

## 2013-10-01 NOTE — Progress Notes (Signed)
Subjective:    Patient ID: Neil Henderson, male    DOB: 07/14/1975, 38 y.o.   MRN: [id number]  HPI Comments: 38 yo male presents for 3 month F/U for HTN, Cholesterol, Pre-Dm, D. Deficient. He is is not eating healthy. He has not been exercising over the last month. He has gained 10 # since last visit. He has noted headaches daily x 2 weeks. He has congestion and advil helps some. He also has neck history and thinks he may be sleeping funny causing neck discomfort.   Hyperlipidemia  Hypertension Associated symptoms include neck pain.  Last labs INSULIN 37 A1C 5.5 T 182 TG 208 L 99  Current Outpatient Prescriptions on File Prior to Visit  Medication Sig Dispense Refill  . cholecalciferol (VITAMIN D) 1000 UNITS tablet Take 10,000 Units by mouth daily.      . fenofibrate micronized (LOFIBRA) 134 MG capsule Take 1 capsule (134 mg total) by mouth daily before breakfast.  90 capsule  1  . lisinopril (PRINIVIL,ZESTRIL) 40 MG tablet Take one tablet by mouth one time daily  90 tablet  2  . pravastatin (PRAVACHOL) 40 MG tablet Take 40 mg by mouth daily.      Marland Kitchen testosterone cypionate (DEPO-TESTOSTERONE) 200 MG/ML injection 2 cc IM  Q2wk.s  And 3cc syringes and 1"  21 G. needles  10 mL  5   No current facility-administered medications on file prior to visit.   No Known Allergies Past Medical History  Diagnosis Date  . Hypertension   . Hyperlipidemia   . Prediabetes   . Hypogonadism male   . Vitamin D deficiency   . Obesity       Review of Systems  HENT: Positive for congestion and postnasal drip.   Musculoskeletal: Positive for neck pain.  All other systems reviewed and are negative.  BP 124/62  Pulse 84  Temp(Src) 98.2 F (36.8 C) (Temporal)  Resp 18  Ht 5\' 9"  (1.753 m)  Wt 211 lb (95.709 kg)  BMI 31.15 kg/m2     Objective:   Physical Exam  Nursing note and vitals reviewed. Constitutional: He is oriented to person, place, and time. He appears well-developed and  well-nourished.  HENT:  Head: Normocephalic and atraumatic.  Right Ear: External ear normal.  Left Ear: External ear normal.  Nose: Nose normal.  Mouth/Throat: No oropharyngeal exudate.  Cloudy TM's bilaterally, Cobblestones posterior pharynx   Eyes: Conjunctivae and EOM are normal.  Neck: Normal range of motion. Neck supple. No JVD present. No thyromegaly present.  Cardiovascular: Normal rate, regular rhythm, normal heart sounds and intact distal pulses.   Pulmonary/Chest: Effort normal and breath sounds normal.  Abdominal: Soft. Bowel sounds are normal. He exhibits no distension and no mass. There is no tenderness. There is no rebound and no guarding.  Musculoskeletal: Normal range of motion. He exhibits no edema and no tenderness.  Lymphadenopathy:    He has no cervical adenopathy.  Neurological: He is alert and oriented to person, place, and time. He has normal reflexes. No cranial nerve deficit. Coordination normal.  Skin: Skin is warm and dry.  Psychiatric: He has a normal mood and affect. His behavior is normal. Judgment and thought content normal.          Assessment & Plan:  1.  3 month F/U for HTN, Cholesterol, Pre-Dm, D. Deficient. Needs healthy diet, cardio QD and obtain healthy weight. Check Labs, Check BP if >130/80 call office  2. HA? Neck HX vs Allergic  rhinitis- Allegra OTC, increase H2o, allergy hygiene explained. Neck roll pillow suggested, w/c if SX increase or ER.  3. Tobacco Dep- advised cessation techniques and need for d/c to decrease Risk. CHANTIX 1 MG bid GIVEN FOR RESTART, PATIENT AWARE OF SE

## 2013-10-02 LAB — HEPATIC FUNCTION PANEL
ALK PHOS: 52 U/L (ref 39–117)
ALT: 24 U/L (ref 0–53)
AST: 21 U/L (ref 0–37)
Albumin: 4.2 g/dL (ref 3.5–5.2)
BILIRUBIN INDIRECT: 0.4 mg/dL (ref 0.2–1.2)
Bilirubin, Direct: 0.1 mg/dL (ref 0.0–0.3)
TOTAL PROTEIN: 6.6 g/dL (ref 6.0–8.3)
Total Bilirubin: 0.5 mg/dL (ref 0.2–1.2)

## 2013-10-02 LAB — HEMOGLOBIN A1C
HEMOGLOBIN A1C: 5.5 % (ref <5.7)
Mean Plasma Glucose: 111 mg/dL (ref <117)

## 2013-10-02 LAB — BASIC METABOLIC PANEL WITH GFR
BUN: 16 mg/dL (ref 6–23)
CALCIUM: 10.1 mg/dL (ref 8.4–10.5)
CHLORIDE: 99 meq/L (ref 96–112)
CO2: 26 meq/L (ref 19–32)
Creat: 1.15 mg/dL (ref 0.50–1.35)
GFR, EST NON AFRICAN AMERICAN: 80 mL/min
GFR, Est African American: >89 mL/min
Glucose, Bld: 86 mg/dL (ref 70–99)
Potassium: 4.3 mEq/L (ref 3.5–5.3)
SODIUM: 135 meq/L (ref 135–145)

## 2013-10-02 LAB — LIPID PANEL
Cholesterol: 151 mg/dL (ref 0–200)
HDL: 33 mg/dL — AB (ref >39)
LDL CALC: 63 mg/dL (ref 0–99)
Total CHOL/HDL Ratio: 4.6 Ratio
Triglycerides: 275 mg/dL — ABNORMAL HIGH (ref <150)
VLDL: 55 mg/dL — ABNORMAL HIGH (ref 0–40)

## 2013-10-02 LAB — INSULIN, FASTING: INSULIN FASTING, SERUM: 176 u[IU]/mL — AB (ref 3–28)

## 2013-12-20 ENCOUNTER — Other Ambulatory Visit: Payer: Self-pay | Admitting: Internal Medicine

## 2013-12-20 ENCOUNTER — Other Ambulatory Visit: Payer: Self-pay | Admitting: Physician Assistant

## 2013-12-24 ENCOUNTER — Ambulatory Visit (INDEPENDENT_AMBULATORY_CARE_PROVIDER_SITE_OTHER): Payer: BC Managed Care – PPO | Admitting: Internal Medicine

## 2013-12-24 ENCOUNTER — Encounter: Payer: Self-pay | Admitting: Internal Medicine

## 2013-12-24 VITALS — BP 120/82 | HR 76 | Temp 99.1°F | Resp 16 | Ht 69.5 in | Wt 223.6 lb

## 2013-12-24 DIAGNOSIS — Z0000 Encounter for general adult medical examination without abnormal findings: Secondary | ICD-10-CM

## 2013-12-24 DIAGNOSIS — R7309 Other abnormal glucose: Secondary | ICD-10-CM

## 2013-12-24 DIAGNOSIS — E559 Vitamin D deficiency, unspecified: Secondary | ICD-10-CM

## 2013-12-24 DIAGNOSIS — Z111 Encounter for screening for respiratory tuberculosis: Secondary | ICD-10-CM

## 2013-12-24 DIAGNOSIS — Z113 Encounter for screening for infections with a predominantly sexual mode of transmission: Secondary | ICD-10-CM

## 2013-12-24 DIAGNOSIS — R740 Nonspecific elevation of levels of transaminase and lactic acid dehydrogenase [LDH]: Secondary | ICD-10-CM

## 2013-12-24 DIAGNOSIS — R7401 Elevation of levels of liver transaminase levels: Secondary | ICD-10-CM

## 2013-12-24 DIAGNOSIS — Z125 Encounter for screening for malignant neoplasm of prostate: Secondary | ICD-10-CM

## 2013-12-24 DIAGNOSIS — Z79899 Other long term (current) drug therapy: Secondary | ICD-10-CM | POA: Insufficient documentation

## 2013-12-24 DIAGNOSIS — R7402 Elevation of levels of lactic acid dehydrogenase (LDH): Secondary | ICD-10-CM

## 2013-12-24 DIAGNOSIS — I10 Essential (primary) hypertension: Secondary | ICD-10-CM

## 2013-12-24 DIAGNOSIS — Z1212 Encounter for screening for malignant neoplasm of rectum: Secondary | ICD-10-CM

## 2013-12-24 LAB — CBC WITH DIFFERENTIAL/PLATELET
BASOS ABS: 0.1 10*3/uL (ref 0.0–0.1)
BASOS PCT: 1 % (ref 0–1)
EOS ABS: 0.2 10*3/uL (ref 0.0–0.7)
Eosinophils Relative: 3 % (ref 0–5)
HCT: 43 % (ref 39.0–52.0)
Hemoglobin: 14.9 g/dL (ref 13.0–17.0)
Lymphocytes Relative: 19 % (ref 12–46)
Lymphs Abs: 1.5 10*3/uL (ref 0.7–4.0)
MCH: 30 pg (ref 26.0–34.0)
MCHC: 34.7 g/dL (ref 30.0–36.0)
MCV: 86.7 fL (ref 78.0–100.0)
Monocytes Absolute: 0.6 10*3/uL (ref 0.1–1.0)
Monocytes Relative: 8 % (ref 3–12)
NEUTROS ABS: 5.5 10*3/uL (ref 1.7–7.7)
NEUTROS PCT: 69 % (ref 43–77)
PLATELETS: 315 10*3/uL (ref 150–400)
RBC: 4.96 MIL/uL (ref 4.22–5.81)
RDW: 13 % (ref 11.5–15.5)
WBC: 7.9 10*3/uL (ref 4.0–10.5)

## 2013-12-24 NOTE — Progress Notes (Signed)
Patient ID: Neil Henderson, male   DOB: January 18, 1976, 38 y.o.   MRN: [id number]   Annual Screening Comprehensive Examination  This very nice 38 y.o.MWM presents for complete physical.  Patient has been followed for HTN, Prediabetes, Hyperlipidemia, Testosterone and Vitamin D Deficiency.   HTN predates since 2007. Patient's BP has been controlled at home.Today's BP: 120/82 mmHg. Patient denies any cardiac symptoms as chest pain, palpitations, shortness of breath, dizziness or ankle swelling.   Patient's hyperlipidemia is controlled with diet and medications. Patient denies myalgias or other medication SE's. Last cholesterol last visit was  Lab Results  Component Value Date   CHOL 151 10/01/2013   HDL 33* 10/01/2013   LDLCALC 63 10/01/2013   TRIG 275* 10/01/2013   CHOLHDL 4.6 10/01/2013    Patient has prediabetes/insulin resistance since  Mar 2013 with A1c 5.7% and last A1c is 7.1% today.  Patient denies reactive hypoglycemic symptoms, visual blurring, diabetic polys or paresthesias.    Patient has been on testosterone replacement with improved stamina and sense of well- being.Finally, patient has history of Vitamin D Deficiency of 13 in 2008 and last vitamin D was 36 in Jan 2015.   Medication Sig  . VITAMIN D 1000 UNITS  Take 10,000 Units by mouth daily.  . fenofibrate  134 MG capsule tAKE 1 TABLET DAILY  . lisinopril  40 MG tablet Take one tablet by mouth one time daily  . pravastatin  40 MG tablet Take one tablet by mouth at bedtime  . DEPO-TESTOSTERONE 200 MG/ML  2 cc IM  Q2wk.s   No Known Allergies  Past Medical History  Diagnosis Date  . Hypertension   . Hyperlipidemia   . Prediabetes   . Hypogonadism male   . Vitamin D deficiency   . Obesity     Family History  Problem Relation Age of Onset  . Hyperlipidemia Mother   . Hyperlipidemia Father   . Cancer Paternal Grandfather     Lung   History   Social History  . Marital Status: Married    Spouse Name: N/A    Number of  Children: N/A  . Years of Education: N/A   Occupational History  . Not on file.   Social History Main Topics  . Smoking status: Former Smoker -- 0.75 packs/day for 9 years    Types: Cigarettes    Quit date: 10/24/2013  . Smokeless tobacco: Former Systems developer  . Alcohol Use: 0.5 oz/week    1 drink(s) per week     Comment: moderate  . Drug Use: 2.00 per week    Special: Marijuana  . Sexual Activity: Not on file    ROS Constitutional: Denies fever, chills, weight loss/gain, headaches, insomnia, fatigue, night sweats or change in appetite. Eyes: Denies redness, blurred vision, diplopia, discharge, itchy or watery eyes.  ENT: Denies discharge, congestion, post nasal drip, epistaxis, sore throat, earache, hearing loss, dental pain, Tinnitus, Vertigo, Sinus pain or snoring.  Cardio: Denies chest pain, palpitations, irregular heartbeat, syncope, dyspnea, diaphoresis, orthopnea, PND, claudication or edema Respiratory: denies cough, dyspnea, DOE, pleurisy, hoarseness, laryngitis or wheezing.  Gastrointestinal: Denies dysphagia, heartburn, reflux, water brash, pain, cramps, nausea, vomiting, bloating, diarrhea, constipation, hematemesis, melena, hematochezia, jaundice or hemorrhoids Genitourinary: Denies dysuria, frequency, urgency, nocturia, hesitancy, discharge, hematuria or flank pain Musculoskeletal: Denies arthralgia, myalgia, stiffness, Jt. Swelling, pain, limp or strain/sprain. Skin: Denies puritis, rash, hives, warts, acne, eczema or change in skin lesion Neuro: No weakness, tremor, incoordination, spasms, paresthesia or pain Psychiatric: Denies confusion,  memory loss or sensory loss Endocrine: Denies change in weight, skin, hair change, nocturia, and paresthesia, diabetic polys, visual blurring or hyper / hypo glycemic episodes.  Heme/Lymph: No excessive bleeding, bruising or enlarged lymph nodes.  Physical Exam  BP 120/82  P 76  T 99.1 F   Resp 16  Ht 5' 9.5"   Wt 223 lb 9.6 oz    BMI 32.56 kg/m2  General Appearance: Well nourished, in no apparent distress. Eyes: PERRLA, EOMs, conjunctiva no swelling or erythema, normal fundi and vessels. Sinuses: No frontal/maxillary tenderness ENT/Mouth: EACs patent / TMs  nl. Nares clear without erythema, swelling, mucoid exudates. Oral hygiene is good. No erythema, swelling, or exudate. Tongue normal, non-obstructing. Tonsils not swollen or erythematous. Hearing normal.  Neck: Supple, thyroid normal. No bruits, nodes or JVD. Respiratory: Respiratory effort normal.  BS equal and clear bilateral without rales, rhonci, wheezing or stridor. Cardio: Heart sounds are normal with regular rate and rhythm and no murmurs, rubs or gallops. Peripheral pulses are normal and equal bilaterally without edema. No aortic or femoral bruits. Chest: symmetric with normal excursions and percussion.  Abdomen: Flat, soft, with bowl sounds. Nontender, no guarding, rebound, hernias, masses, or organomegaly.  Lymphatics: Non tender without lymphadenopathy.  Genitourinary: No hernias.Testes nl. DRE - prostate nl for age - smooth & firm w/o nodules. Musculoskeletal: Full ROM all peripheral extremities, joint stability, 5/5 strength, and normal gait. Skin: Warm and dry without rashes, lesions, cyanosis, clubbing or  ecchymosis.  Neuro: Cranial nerves intact, reflexes equal bilaterally. Normal muscle tone, no cerebellar symptoms. Sensation intact.  Pysch: Awake and oriented X 3with normal affect, insight and judgment appropriate.   Assessment and Plan  1. Annual Screening Examination 2. Hypertension  3. Hyperlipidemia 4. Pre Diabetes 5. Vitamin D Deficiency 6. Testosterone Deficiency  Continue prudent diet as discussed, weight control, BP monitoring, regular exercise, and medications as discussed.  Discussed med effects and SE's. Routine screening labs and tests as requested with regular follow-up as recommended.

## 2013-12-24 NOTE — Patient Instructions (Signed)

## 2013-12-25 LAB — HEPATIC FUNCTION PANEL
ALT: 52 U/L (ref 0–53)
AST: 38 U/L — ABNORMAL HIGH (ref 0–37)
Albumin: 4.8 g/dL (ref 3.5–5.2)
Alkaline Phosphatase: 59 U/L (ref 39–117)
BILIRUBIN INDIRECT: 0.6 mg/dL (ref 0.2–1.2)
Bilirubin, Direct: 0.1 mg/dL (ref 0.0–0.3)
TOTAL PROTEIN: 7.2 g/dL (ref 6.0–8.3)
Total Bilirubin: 0.7 mg/dL (ref 0.2–1.2)

## 2013-12-25 LAB — MICROALBUMIN / CREATININE URINE RATIO
Creatinine, Urine: 111.9 mg/dL
Microalb Creat Ratio: 4.5 mg/g (ref 0.0–30.0)
Microalb, Ur: 0.5 mg/dL (ref 0.00–1.89)

## 2013-12-25 LAB — LIPID PANEL
CHOLESTEROL: 228 mg/dL — AB (ref 0–200)
HDL: 39 mg/dL — ABNORMAL LOW (ref >39)
LDL Cholesterol: 155 mg/dL — ABNORMAL HIGH (ref 0–99)
TRIGLYCERIDES: 171 mg/dL — AB (ref <150)
Total CHOL/HDL Ratio: 5.8 Ratio
VLDL: 34 mg/dL (ref 0–40)

## 2013-12-25 LAB — HEPATITIS A ANTIBODY, TOTAL: Hep A Total Ab: NONREACTIVE

## 2013-12-25 LAB — URINALYSIS, MICROSCOPIC ONLY
Bacteria, UA: NONE SEEN
CASTS: NONE SEEN
CRYSTALS: NONE SEEN
SQUAMOUS EPITHELIAL / LPF: NONE SEEN

## 2013-12-25 LAB — PSA: PSA: 0.66 ng/mL (ref <=4.00)

## 2013-12-25 LAB — HEMOGLOBIN A1C
HEMOGLOBIN A1C: 5.6 % (ref <5.7)
Mean Plasma Glucose: 114 mg/dL (ref <117)

## 2013-12-25 LAB — BASIC METABOLIC PANEL WITH GFR
BUN: 12 mg/dL (ref 6–23)
CALCIUM: 9.7 mg/dL (ref 8.4–10.5)
CO2: 24 mEq/L (ref 19–32)
Chloride: 103 mEq/L (ref 96–112)
Creat: 1.05 mg/dL (ref 0.50–1.35)
GFR, EST NON AFRICAN AMERICAN: 88 mL/min
Glucose, Bld: 88 mg/dL (ref 70–99)
Potassium: 4.4 mEq/L (ref 3.5–5.3)
SODIUM: 139 meq/L (ref 135–145)

## 2013-12-25 LAB — VITAMIN B12: Vitamin B-12: 484 pg/mL (ref 211–911)

## 2013-12-25 LAB — INSULIN, FASTING: Insulin fasting, serum: 48 u[IU]/mL — ABNORMAL HIGH (ref 3–28)

## 2013-12-25 LAB — HEPATITIS B SURFACE ANTIBODY,QUALITATIVE: HEP B S AB: NEGATIVE

## 2013-12-25 LAB — TSH: TSH: 1.364 u[IU]/mL (ref 0.350–4.500)

## 2013-12-25 LAB — HIV ANTIBODY (ROUTINE TESTING W REFLEX): HIV 1&2 Ab, 4th Generation: NONREACTIVE

## 2013-12-25 LAB — MAGNESIUM: MAGNESIUM: 1.9 mg/dL (ref 1.5–2.5)

## 2013-12-25 LAB — RPR

## 2013-12-25 LAB — TESTOSTERONE: TESTOSTERONE: 345 ng/dL (ref 300–890)

## 2013-12-25 LAB — HEPATITIS C ANTIBODY: HCV AB: NEGATIVE

## 2013-12-25 LAB — HEPATITIS B CORE ANTIBODY, TOTAL: HEP B C TOTAL AB: NONREACTIVE

## 2013-12-25 LAB — VITAMIN D 25 HYDROXY (VIT D DEFICIENCY, FRACTURES): Vit D, 25-Hydroxy: 65 ng/mL (ref 30–89)

## 2013-12-26 LAB — HEPATITIS B E ANTIBODY: Hepatitis Be Antibody: NONREACTIVE

## 2013-12-28 ENCOUNTER — Other Ambulatory Visit: Payer: Self-pay | Admitting: Internal Medicine

## 2013-12-28 MED ORDER — ATORVASTATIN CALCIUM 80 MG PO TABS: ORAL_TABLET | ORAL | Status: DC

## 2013-12-31 LAB — TB SKIN TEST
INDURATION: 0 mm
TB SKIN TEST: NEGATIVE

## 2014-03-24 ENCOUNTER — Other Ambulatory Visit: Payer: Self-pay | Admitting: Physician Assistant

## 2014-03-24 ENCOUNTER — Other Ambulatory Visit: Payer: Self-pay | Admitting: Internal Medicine

## 2014-03-31 ENCOUNTER — Encounter: Payer: Self-pay | Admitting: Physician Assistant

## 2014-03-31 ENCOUNTER — Ambulatory Visit (INDEPENDENT_AMBULATORY_CARE_PROVIDER_SITE_OTHER): Payer: BC Managed Care – PPO | Admitting: Physician Assistant

## 2014-03-31 VITALS — BP 110/62 | HR 88 | Temp 98.2°F | Resp 16 | Ht 69.0 in | Wt 231.0 lb

## 2014-03-31 DIAGNOSIS — R7309 Other abnormal glucose: Secondary | ICD-10-CM

## 2014-03-31 DIAGNOSIS — Z23 Encounter for immunization: Secondary | ICD-10-CM

## 2014-03-31 DIAGNOSIS — Z79899 Other long term (current) drug therapy: Secondary | ICD-10-CM

## 2014-03-31 DIAGNOSIS — E782 Mixed hyperlipidemia: Secondary | ICD-10-CM

## 2014-03-31 DIAGNOSIS — E291 Testicular hypofunction: Secondary | ICD-10-CM

## 2014-03-31 DIAGNOSIS — I10 Essential (primary) hypertension: Secondary | ICD-10-CM

## 2014-03-31 DIAGNOSIS — R7303 Prediabetes: Secondary | ICD-10-CM

## 2014-03-31 DIAGNOSIS — E669 Obesity, unspecified: Secondary | ICD-10-CM

## 2014-03-31 DIAGNOSIS — E559 Vitamin D deficiency, unspecified: Secondary | ICD-10-CM

## 2014-03-31 LAB — CBC WITH DIFFERENTIAL/PLATELET
BASOS PCT: 1 % (ref 0–1)
Basophils Absolute: 0.1 10*3/uL (ref 0.0–0.1)
Eosinophils Absolute: 0.2 10*3/uL (ref 0.0–0.7)
Eosinophils Relative: 2 % (ref 0–5)
HCT: 44.7 % (ref 39.0–52.0)
HEMOGLOBIN: 15.2 g/dL (ref 13.0–17.0)
Lymphocytes Relative: 29 % (ref 12–46)
Lymphs Abs: 2.4 10*3/uL (ref 0.7–4.0)
MCH: 30 pg (ref 26.0–34.0)
MCHC: 34 g/dL (ref 30.0–36.0)
MCV: 88.2 fL (ref 78.0–100.0)
MONOS PCT: 9 % (ref 3–12)
Monocytes Absolute: 0.8 10*3/uL (ref 0.1–1.0)
NEUTROS ABS: 5 10*3/uL (ref 1.7–7.7)
NEUTROS PCT: 59 % (ref 43–77)
PLATELETS: 333 10*3/uL (ref 150–400)
RBC: 5.07 MIL/uL (ref 4.22–5.81)
RDW: 12.8 % (ref 11.5–15.5)
WBC: 8.4 10*3/uL (ref 4.0–10.5)

## 2014-03-31 NOTE — Patient Instructions (Signed)
Can try edamamme  We want weight loss that will last so you should lose 1-2 pounds a week.  THAT IS IT! Please pick THREE things a month to change. Once it is a habit check off the item. Then pick another three items off the list to become habits.  If you are already doing a habit on the list GREAT!  Cross that item off! o Don't drink your calories. Ie, alcohol, soda, fruit juice, and sweet tea.  o Drink more water. Drink a glass when you feel hungry or before each meal.  o Eat breakfast - Complex carb and protein (likeDannon light and fit yogurt, oatmeal, fruit, eggs, [place] bacon). o Measure your cereal.  Eat no more than one cup a day. (ie [place]) o Eat an apple a day. o Add a vegetable a day. o Try a new vegetable a month. o Use Pam! Stop using oil or butter to cook. o Don't finish your plate or use smaller plates. o Share your dessert. o Eat sugar free Jello for dessert or frozen grapes. o Don't eat 2-3 hours before bed. o Switch to whole wheat bread, pasta, and brown rice. o Make healthier choices when you eat out. No fries! o Pick baked chicken, NOT fried. o Don't forget to SLOW DOWN when you eat. It is not going anywhere.  o Take the stairs. o Park far away in the parking lot o [employer] (or weights) for 10 minutes while watching TV. o Walk at work for 10 minutes during break. o Walk outside 1 time a week with your friend, kids, dog, or significant other. o Start a walking group at [place] the mall as much as you can tolerate.  o Keep a food diary. o Weigh yourself daily. o Walk for 15 minutes 3 days per week. o Cook at home more often and eat out less.  If life happens and you go back to old habits, it is okay.  Just start over. You can do it!   If you experience chest pain, get short of breath, or tired during the exercise, please stop immediately and inform your doctor.     Bad carbs also include fruit juice, alcohol, and sweet tea. These are empty calories  that do not signal to your brain that you are full.   Please remember the good carbs are still carbs which convert into sugar. So please measure them out no more than 1/2-1 cup of rice, oatmeal, pasta, and beans.  Veggies are however free foods! Pile them on.   I like lean protein at every meal such as chicken, [place], pork chops, cottage cheese, etc. Just do not fry these meats and please center your meal around vegetable, the meats should be a side dish.   No all fruit is created equal. Please see the list below, the fruit at the bottom is higher in sugars than the fruit at the top  '

## 2014-03-31 NOTE — Progress Notes (Signed)
Assessment and Plan:  Hypertension: Continue medication, monitor blood pressure at home. Continue DASH diet. Cholesterol: Continue diet and exercise. Check cholesterol.  Pre-diabetes-Continue diet and exercise. Check A1C Vitamin D Def- check level and continue medications.  Obesity with co morbidities- long discussion about weight loss, diet, and exercise  Continue diet and meds as discussed. Further disposition pending results of labs.  HPI 38 y.o. male  presents for 3 month follow up with hypertension, hyperlipidemia, prediabetes and vitamin D. His blood pressure has been controlled at home, today their BP is BP: 110/62 mmHg He does not workout. He denies chest pain, shortness of breath, dizziness.  He is on cholesterol medication and denies myalgias, that visit he was switched from simvastatin to lipitor 1/2 pill every other day. His cholesterol is not at goal. The cholesterol last visit was:   Lab Results  Component Value Date   CHOL 228* 12/24/2013   HDL 39* 12/24/2013   LDLCALC 155* 12/24/2013   TRIG 171* 12/24/2013   CHOLHDL 5.8 12/24/2013   He has been working on diet and exercise for prediabetes, and denies paresthesia of the feet, polydipsia and polyuria. Last A1C in the office was:  Lab Results  Component Value Date   HGBA1C 5.6 12/24/2013   Patient is on Vitamin D supplement.   Lab Results  Component Value Date   VD25OH 1 12/24/2013     He has quit smoking since May/2015 but has gained 20 lbs back since then.   BMI is Body mass index is 34.1 kg/(m^2)., he is struggling with weight loss due to stress, time limitations, smoking cessation.  Wt Readings from Last 3 Encounters:  03/31/14 231 lb (104.781 kg)  12/24/13 223 lb 9.6 oz (101.424 kg)  10/01/13 211 lb (95.709 kg)   He has a history of testosterone deficiency and is on testosterone replacement, does 1/5 cc q 3 weeks. He states that the testosterone helps with his energy, libido, muscle mass. Lab Results  Component Value  Date   TESTOSTERONE 345 12/24/2013     Current Medications:  Current Outpatient Prescriptions on File Prior to Visit  Medication Sig Dispense Refill  . atorvastatin (LIPITOR) 80 MG tablet Take 1 tablet daily or as directed for cholesterol  30 tablet  11  . cholecalciferol (VITAMIN D) 1000 UNITS tablet Take 10,000 Units by mouth daily.      . fenofibrate micronized (LOFIBRA) 134 MG capsule take 1 capsule by mouth daily before breakfast  90 capsule  1  . lisinopril (PRINIVIL,ZESTRIL) 40 MG tablet Take one tablet by mouth one time daily  90 tablet  2  . testosterone cypionate (DEPOTESTOTERONE CYPIONATE) 200 MG/ML injection inject 66ml intramuscularly every 2 weeks as directed  10 mL  2  . varenicline (CHANTIX) 1 MG tablet Take 1 tablet (1 mg total) by mouth 2 (two) times daily.  60 tablet  5   No current facility-administered medications on file prior to visit.   Medical History:  Past Medical History  Diagnosis Date  . Hypertension   . Hyperlipidemia   . Prediabetes   . Hypogonadism male   . Vitamin D deficiency   . Obesity    Allergies: No Known Allergies   Review of Systems: [X]  = complains of  [ ]  = denies  General: Fatigue [ ]  Fever [ ]  Chills [ ]  Weakness [ ]   Insomnia [ ]  Eyes: Redness [ ]  Blurred vision [ ]  Diplopia [ ]   ENT: Congestion [ ]  Sinus Pain [ ]   Post Nasal Drip [ ]  Sore Throat [ ]  Earache [ ]   Cardiac: Chest pain/pressure [ ]  SOB [ ]  Orthopnea [ ]   Palpitations [ ]   Paroxysmal nocturnal dyspnea[ ]  Claudication [ ]  Edema [ ]   Pulmonary: Cough [ ]  Wheezing[ ]   SOB [ ]   Snoring [ ]   GI: Nausea [ ]  Vomiting[ ]  Dysphagia[ ]  Heartburn[ ]  Abdominal pain [ ]  Constipation [ ] ; Diarrhea [ ] ; BRBPR [ ]  Melena[ ]  GU: Hematuria[ ]  Dysuria [ ]  Nocturia[ ]  Urgency [ ]   Hesitancy [ ]  Discharge [ ]  Neuro: Headaches[ ]  Vertigo[ ]  Paresthesias[ ]  Spasm [ ]  Speech changes [ ]  Incoordination [ ]   Ortho: Arthritis [ ]  Joint pain [ ]  Muscle pain [ ]  Joint swelling [ ]  Back Pain [ ]  Skin:   Rash [ ]   Pruritis [ ]  Change in skin lesion [ ]   Psych: Depression[ ]  Anxiety[ ]  Confusion [ ]  Memory loss [ ]   Heme/Lypmh: Bleeding [ ]  Bruising [ ]  Enlarged lymph nodes [ ]   Endocrine: Visual blurring [ ]  Paresthesia [ ]  Polyuria [ ]  Polydypsea [ ]    Heat/cold intolerance [ ]  Hypoglycemia [ ]   Family history- Review and unchanged Social history- Review and unchanged Physical Exam: BP 110/62  Pulse 88  Temp(Src) 98.2 F (36.8 C)  Resp 16  Ht 5\' 9"  (1.753 m)  Wt 231 lb (104.781 kg)  BMI 34.10 kg/m2 Wt Readings from Last 3 Encounters:  03/31/14 231 lb (104.781 kg)  12/24/13 223 lb 9.6 oz (101.424 kg)  10/01/13 211 lb (95.709 kg)   General Appearance: Well nourished, in no apparent distress. Eyes: PERRLA, EOMs, conjunctiva no swelling or erythema Sinuses: No Frontal/maxillary tenderness ENT/Mouth: Ext aud canals clear, TMs without erythema, bulging. No erythema, swelling, or exudate on post pharynx.  Tonsils not swollen or erythematous. Hearing normal.  Neck: Supple, thyroid normal.  Respiratory: Respiratory effort normal, BS equal bilaterally without rales, rhonchi, wheezing or stridor.  Cardio: RRR with no MRGs. Brisk peripheral pulses without edema.  Abdomen: Soft, + BS.  Non tender, no guarding, rebound, hernias, masses. Lymphatics: Non tender without lymphadenopathy.  Musculoskeletal: Full ROM, 5/5 strength, normal gait.  Skin: Warm, dry without rashes, lesions, ecchymosis.  Neuro: Cranial nerves intact. Normal muscle tone, no cerebellar symptoms. Sensation intact.  Psych: Awake and oriented X 3, normal affect, Insight and Judgment appropriate.    Vicie Mutters 4:39 PM

## 2014-04-01 LAB — LIPID PANEL
Cholesterol: 174 mg/dL (ref 0–200)
HDL: 35 mg/dL — ABNORMAL LOW (ref >39)
LDL CALC: 70 mg/dL (ref 0–99)
Total CHOL/HDL Ratio: 5 Ratio
Triglycerides: 347 mg/dL — ABNORMAL HIGH (ref <150)
VLDL: 69 mg/dL — AB (ref 0–40)

## 2014-04-01 LAB — HEPATIC FUNCTION PANEL
ALT: 36 U/L (ref 0–53)
AST: 25 U/L (ref 0–37)
Albumin: 4.4 g/dL (ref 3.5–5.2)
Alkaline Phosphatase: 49 U/L (ref 39–117)
BILIRUBIN DIRECT: 0.1 mg/dL (ref 0.0–0.3)
Indirect Bilirubin: 0.3 mg/dL (ref 0.2–1.2)
Total Bilirubin: 0.4 mg/dL (ref 0.2–1.2)
Total Protein: 7 g/dL (ref 6.0–8.3)

## 2014-04-01 LAB — INSULIN, FASTING: Insulin fasting, serum: 63.6 u[IU]/mL — ABNORMAL HIGH (ref 2.0–19.6)

## 2014-04-01 LAB — BASIC METABOLIC PANEL WITH GFR
BUN: 17 mg/dL (ref 6–23)
CO2: 26 mEq/L (ref 19–32)
Calcium: 9.9 mg/dL (ref 8.4–10.5)
Chloride: 105 mEq/L (ref 96–112)
Creat: 1.35 mg/dL (ref 0.50–1.35)
GFR, EST AFRICAN AMERICAN: 75 mL/min
GFR, EST NON AFRICAN AMERICAN: 65 mL/min
GLUCOSE: 102 mg/dL — AB (ref 70–99)
Potassium: 4.7 mEq/L (ref 3.5–5.3)
SODIUM: 139 meq/L (ref 135–145)

## 2014-04-01 LAB — MAGNESIUM: Magnesium: 2 mg/dL (ref 1.5–2.5)

## 2014-04-01 LAB — TSH: TSH: 1.271 u[IU]/mL (ref 0.350–4.500)

## 2014-04-01 LAB — HEMOGLOBIN A1C
Hgb A1c MFr Bld: 6 % — ABNORMAL HIGH (ref <5.7)
MEAN PLASMA GLUCOSE: 126 mg/dL — AB (ref <117)

## 2014-07-06 ENCOUNTER — Other Ambulatory Visit: Payer: Self-pay | Admitting: Physician Assistant

## 2014-07-06 ENCOUNTER — Encounter: Payer: Self-pay | Admitting: Internal Medicine

## 2014-07-06 ENCOUNTER — Ambulatory Visit (INDEPENDENT_AMBULATORY_CARE_PROVIDER_SITE_OTHER): Payer: BLUE CROSS/BLUE SHIELD | Admitting: Physician Assistant

## 2014-07-06 VITALS — BP 120/66 | HR 80 | Temp 97.5°F | Resp 16 | Ht 69.0 in | Wt 230.0 lb

## 2014-07-06 DIAGNOSIS — I10 Essential (primary) hypertension: Secondary | ICD-10-CM

## 2014-07-06 DIAGNOSIS — E291 Testicular hypofunction: Secondary | ICD-10-CM

## 2014-07-06 DIAGNOSIS — R7309 Other abnormal glucose: Secondary | ICD-10-CM

## 2014-07-06 DIAGNOSIS — E782 Mixed hyperlipidemia: Secondary | ICD-10-CM

## 2014-07-06 DIAGNOSIS — E559 Vitamin D deficiency, unspecified: Secondary | ICD-10-CM

## 2014-07-06 DIAGNOSIS — Z79899 Other long term (current) drug therapy: Secondary | ICD-10-CM

## 2014-07-06 DIAGNOSIS — R7303 Prediabetes: Secondary | ICD-10-CM

## 2014-07-06 NOTE — Patient Instructions (Addendum)
Recommend the book "The END of DIETING" by Dr Baker Janus   and the book "The END of DIABETES " by Dr Excell Seltzer  At [email or web address] - get book & Audio CD's      Being diabetic has a  300% increased risk for heart attack, stroke, cancer, and alzheimer- type vascular dementia. It is very important that you work harder with diet by avoiding all foods that are white except chicken & fish. Avoid white rice (brown & wild rice is OK), white potatoes (sweetpotatoes in moderation is OK), White bread or wheat bread or anything made out of white flour like bagels, donuts, rolls, buns, biscuits, cakes, pastries, cookies, pizza crust, and pasta (made from white flour & egg whites) - vegetarian pasta or spinach or wheat pasta is OK. Multigrain breads like Arnold's or Pepperidge Farm, or multigrain sandwich thins or flatbreads.  Diet, exercise and weight loss can reverse and cure diabetes in the early stages.  Diet, exercise and weight loss is very important in the control and prevention of complications of diabetes which affects every system in your body, ie. Brain - dementia/stroke, eyes - glaucoma/blindness, heart - heart attack/heart failure, kidneys - dialysis, stomach - gastric paralysis, intestines - malabsorption, nerves - severe painful neuritis, circulation - gangrene & loss of a leg(s), and finally cancer and Alzheimers.    I recommend avoid fried & greasy foods,  sweets/candy, white rice (brown or wild rice or Quinoa is OK), white potatoes (sweet potatoes are OK) - anything made from white flour - bagels, doughnuts, rolls, buns, biscuits,white and wheat breads, pizza crust and traditional pasta made of white flour & egg white(vegetarian pasta or spinach or wheat pasta is OK).  Multi-grain bread is OK - like multi-grain flat bread or sandwich thins. Avoid alcohol in excess. Exercise is also important.    Eat all the vegetables you want - avoid meat, especially red meat and dairy - especially cheese.  Cheese  is the most concentrated form of trans-fats which is the worst thing to clog up our arteries. Veggie cheese is OK which can be found in the fresh produce section at [employer] or [employer] or [employer] \ Patellar Tendinitis, Jumper's Knee with Rehab Tendinitis is inflammation of a tendon. Tendonitis of the tendon below the kneecap (patella) is known as patellar tendonitis. Patellar tendonitis is a common cause of pain below the kneecap (infrapatellar). Patellar tendonitis may involve a tear (strain) in the ligament. Strains are classified into three categories. Grade 1 strains cause pain, but the tendon is not lengthened. Grade 2 strains include a lengthened ligament, due to the ligament being stretched or partially ruptured. With grade 2 strains there is still function, although function may be decreased. Grade 3 strains involve a complete tear of the tendon or muscle, and function is usually impaired. Patellar tendon strains are usually grade 1 or 2.  SYMPTOMS   Pain, tenderness, swelling, warmth, or redness over the patellar tendon (just below the kneecap).  Pain and loss of strength (sometimes), with forcefully straightening the knee (especially when jumping or rising from a seated or squatting position), or bending the knee completely (squatting or kneeling).  Crackling sound (crepitation) when the tendon is moved or touched. CAUSES  Patellar tendonitis is caused by injury to the patellar tendon. The inflammation is the body's healing response. Common causes of injury include:  Stress from a sudden increase in intensity, frequency, or duration of training.  Overuse of the thigh muscles (quadriceps) and patellar  tendon.  Direct hit (trauma) to the knee or patellar tendon. RISK INCREASES WITH:  Sports that require sudden, explosive quadriceps contraction, such as jumping, quick starts, or kicking.  Running sports, especially running down hills.  Poor strength and flexibility of the  thigh and knee.  Flat feet. PREVENTION  Warm up and stretch properly before activity.  Allow for adequate recovery between workouts.  Maintain physical fitness:  Strength, flexibility, and endurance.  Cardiovascular fitness.  Protect the knee joint with taping, protective strapping, bracing, or elastic compression bandage.  Wear arch supports (orthotics). PROGNOSIS  If treated properly, patellar tendonitis usually heals within 6 weeks.  RELATED COMPLICATIONS   Longer healing time if not properly treated or if not given enough time to heal.  Recurring symptoms if activity is resumed too soon, with overuse, with a direct blow, or when using poor technique.  If untreated, tendon rupture requiring surgery. TREATMENT Treatment first involves the use of ice and medicine to reduce pain and inflammation. The use of strengthening and stretching exercises may help reduce pain with activity. These exercises may be performed at home or with a therapist. Serious cases of tendonitis may require restraining the knee for 10 to 14 days to prevent stress on the tendon and to promote healing. Crutches may be used (uncommon) until you can walk without a limp. For cases in which nonsurgical treatment is unsuccessful, surgery may be advised to remove the inflamed tendon lining (sheath). Surgery is rare, and is only advised after at least 6 months of nonsurgical treatment. MEDICATION   If pain medicine is needed, nonsteroidal anti-inflammatory medicines (aspirin and ibuprofen), or other minor pain relievers (acetaminophen), are often advised.  Do not take pain medicine for 7 days before surgery.  Prescription pain relievers may be given if your caregiver thinks they are needed. Use only as directed and only as much as you need. HEAT AND COLD  Cold treatment (icing) should be applied for 10 to 15 minutes every 2 to 3 hours for inflammation and pain, and immediately after activity that aggravates your  symptoms. Use ice packs or an ice massage.  Heat treatment may be used before performing stretching and strengthening activities prescribed by your caregiver, physical therapist, or athletic trainer. Use a heat pack or a warm water soak. SEEK MEDICAL CARE IF:  Symptoms get worse or do not improve in 2 weeks, despite treatment.  New, unexplained symptoms develop. (Drugs used in treatment may produce side effects.) EXERCISES RANGE OF MOTION (ROM) AND STRETCHING EXERCISES - Patellar Tendinitis (Jumper's Knee) These are some of the initial exercises with which you may start your rehabilitation program, until you see your caregiver again or until your symptoms are resolved. Remember:   Flexible tissue is more tolerant of the stresses placed on it during activities.  Each stretch should be held for 20 to 30 seconds.  A gentle stretching sensation should be felt. STRETCH - Hamstrings, Supine  Lie on your back. Loop a belt or towel over the ball of your right / left foot.  Straighten your right / left knee and slowly pull on the belt to raise your leg. Do not allow the right / left knee to bend. Keep your opposite leg flat on the floor.  Raise the leg until you feel a gentle stretch behind your right / left knee or thigh. Hold this position for __________ seconds. Repeat __________ times. Complete this stretch __________ times per day.  STRETCH - Hamstrings, Doorway  Lie on your  back with your right / left leg extended and resting on the wall, and the opposite leg flat on the ground through the door. At first, position your bottom farther away from the wall.  Keep your right / left knee straight. If you feel a stretch behind your knee or thigh, hold this position for __________ seconds.  If you do not feel a stretch, scoot your bottom closer to the door, and hold __________ seconds. Repeat __________ times. Complete this stretch __________ times per day.  STRETCH - Hamstrings,  Standing  Stand or sit and extend your right / left leg, placing your foot on a chair or foot stool.  Keep a slight arch in your low back and your hips straight forward.  Lead with your chest and lean forward at the waist until you feel a gentle stretch in the back of your right / left knee or thigh. (When done correctly, this exercise requires leaning only a small distance.)  Hold this position for __________ seconds. Repeat __________ times. Complete this stretch __________ times per day. STRETCH - Adductors, Lunge  While standing, spread your legs, with your right / left leg behind you.  Lean away from your right / left leg by bending your opposite knee. You may rest your hands on your thigh for balance.  You should feel a stretch in your right / left inner thigh. Hold for __________ seconds. Repeat __________ times. Complete this exercise __________ times per day.  STRENGTHENING EXERCISES - Patellar Tendinitis (Jumper's Knee) These exercises may help you when beginning to rehabilitate your injury. They may resolve your symptoms with or without further involvement from your physician, physical therapist or athletic trainer. While completing these exercises, remember:   Muscles can gain both the endurance and the strength needed for everyday activities through controlled exercises.  Complete these exercises as instructed by your physician, physical therapist or athletic trainer. Increase the resistance and repetitions only as guided by your caregiver. STRENGTH - Quadriceps, Isometrics  Lie on your back with your right / left leg extended and your opposite knee bent.  Gradually tense the muscles in the front of your right / left thigh. You should see either your kneecap slide up toward your hip or increased dimpling just above the knee. This motion will push the back of the knee down toward the floor, mat, or bed on which you are lying.  Hold the muscle as tight as you can, without  increasing your pain, for __________ seconds.  Relax the muscles slowly and completely in between each repetition. Repeat __________ times. Complete this exercise __________ times per day.  STRENGTH - Quadriceps, Short Arcs  Lie on your back. Place a __________ inch towel roll under your right / left knee, so that the knee bends slightly.  Raise only your lower leg by tightening the muscles in the front of your thigh. Do not allow your thigh to rise.  Hold this position for __________ seconds. Repeat __________ times. Complete this exercise __________ times per day.  OPTIONAL ANKLE WEIGHTS: Begin with ____________________, but DO NOT exceed ____________________. Increase in 1 pound/ 0.5 kilogram increments. STRENGTH - Quadriceps, Straight Leg Raises  Quality counts! Watch for signs that the quadriceps muscle is working, to be sure you are strengthening the correct muscles and not "cheating" by substituting with healthier muscles.  Lay on your back with your right / left leg extended and your opposite knee bent.  Tense the muscles in the front of your right /  left thigh. You should see either your kneecap slide up or increased dimpling just above the knee. Your thigh may even shake a bit.  Tighten these muscles even more and raise your leg 4 to 6 inches off the floor. Hold for __________ seconds.  Keeping these muscles tense, lower your leg.  Relax the muscles slowly and completely between each repetition. Repeat __________ times. Complete this exercise __________ times per day.  STRENGTH - Quadriceps, Squats  Stand in a door frame so that your feet and knees are in line with the frame.  Use your hands for balance, not support, on the frame.  Slowly lower your weight, bending at the hips and knees. Keep your lower legs upright so that they are parallel with the door frame. Squat only within the range that does not increase your knee pain. Never let your hips drop below your  knees.  Slowly return upright, pushing with your legs, not pulling with your hands. Repeat __________ times. Complete this exercise __________ times per day.  STRENGTH - Quadriceps, Step-Downs  Stand on the edge of a step stool or stair. Be prepared to use a countertop or wall for balance, if needed.  Keeping your right / left knee directly over the middle of your foot, slowly touch your opposite heel to the floor or lower step. Do not go all the way to the floor if your knee pain increases; just go as far as you can without increased discomfort. Use your right / left leg muscles, not gravity to lower your body weight.  Slowly push your body weight back up to the starting position. Repeat __________ times. Complete this exercise __________ times per day.  Document Released: 06/12/2005 Document Revised: 10/27/2013 Document Reviewed: 09/24/2008 [facility] Patient Information 2015 [place], [place]. This information is not intended to replace advice given to you by your health care provider. Make sure you discuss any questions you have with your health care provider.

## 2014-07-06 NOTE — Progress Notes (Signed)
Assessment and Plan:  Hypertension: Continue medication, monitor blood pressure at home. Continue DASH diet.  Reminder to go to the ER if any CP, SOB, nausea, dizziness, severe HA, changes vision/speech, left arm numbness and tingling, and jaw pain. Cholesterol: Continue diet and exercise. Check cholesterol.  Pre-diabetes-Continue diet and exercise. Check A1C Vitamin D Def- check level and continue medications.  Obesity with co morbidities- long discussion about weight loss, diet, and exercise Smoking cessation-  commended patient for quitting and reviewed strategies for preventing relapses, continue to try chantix Left knee pain-? Patellar tendonitis- exercises given, RICE, aleve PRN, call if not better.  Hypogonadism- continue replacement therapy, check testosterone levels as needed.   Continue diet and meds as discussed. Further disposition pending results of labs.  HPI 39 y.o. male  presents for 3 month follow up with hypertension, hyperlipidemia, prediabetes and vitamin D. His blood pressure has been controlled at home, today their BP is BP: 120/66 mmHg He does not workout, walks about 12,000 at work.Marland Kitchen He denies chest pain, shortness of breath, dizziness.  He is on cholesterol medication, lipitor 80mg  1/2 pill every other day and denies myalgias. His cholesterol is at goal. The cholesterol last visit was:   Lab Results  Component Value Date   CHOL 174 03/31/2014   HDL 35* 03/31/2014   LDLCALC 70 03/31/2014   TRIG 347* 03/31/2014   CHOLHDL 5.0 03/31/2014  He has been working on diet and exercise for prediabetes, and denies paresthesia of the feet, polydipsia, polyuria and visual disturbances. Last A1C in the office was:  Lab Results  Component Value Date   HGBA1C 6.0* 03/31/2014  Patient is on Vitamin D supplement.   Lab Results  Component Value Date   VD25OH 67 12/24/2013  He has a history of testosterone deficiency and is on testosterone replacement, once a month. He states that  the testosterone helps with his energy, libido, muscle mass. Lab Results  Component Value Date   TESTOSTERONE 345 12/24/2013  He quit May 1st, is still on chantix and doing well.  Left knee pain, was walking to kitchen and left knee "buckled" on him, no other injury. Having medial left knee pain, no popping, clicking, some effusion the day after, getting better.  BMI is Body mass index is 33.95 kg/(m^2)., he is working on diet and exercise. Wt Readings from Last 3 Encounters:  07/06/14 230 lb (104.327 kg)  03/31/14 231 lb (104.781 kg)  12/24/13 223 lb 9.6 oz (101.424 kg)    Current Medications:  Current Outpatient Prescriptions on File Prior to Visit  Medication Sig Dispense Refill  . atorvastatin (LIPITOR) 80 MG tablet Take 1 tablet daily or as directed for cholesterol 30 tablet 11  . cholecalciferol (VITAMIN D) 1000 UNITS tablet Take 10,000 Units by mouth daily.    Marland Kitchen lisinopril (PRINIVIL,ZESTRIL) 40 MG tablet Take one tablet by mouth one time daily 90 tablet 2  . testosterone cypionate (DEPOTESTOTERONE CYPIONATE) 200 MG/ML injection inject 42ml intramuscularly every 2 weeks as directed 10 mL 2  . varenicline (CHANTIX) 1 MG tablet Take 1 tablet (1 mg total) by mouth 2 (two) times daily. 60 tablet 5   No current facility-administered medications on file prior to visit.   Medical History:  Past Medical History  Diagnosis Date  . Hypertension   . Hyperlipidemia   . Prediabetes   . Hypogonadism male   . Vitamin D deficiency   . Obesity    Allergies: No Known Allergies   Review of Systems:  Review of Systems  Constitutional: Negative.   HENT: Negative.   Eyes: Negative.   Respiratory: Negative.   Cardiovascular: Negative.   Gastrointestinal: Negative.   Genitourinary: Negative.   Musculoskeletal: Positive for joint pain (left knee). Negative for myalgias, back pain, falls and neck pain.  Skin: Negative.   Psychiatric/Behavioral: Negative.      Family history- Review and  unchanged Social history- Review and unchanged Physical Exam: BP 120/66 mmHg  Pulse 80  Temp(Src) 97.5 F (36.4 C)  Resp 16  Ht 5\' 9"  (1.753 m)  Wt 230 lb (104.327 kg)  BMI 33.95 kg/m2 Wt Readings from Last 3 Encounters:  07/06/14 230 lb (104.327 kg)  03/31/14 231 lb (104.781 kg)  12/24/13 223 lb 9.6 oz (101.424 kg)   General Appearance: Well nourished, in no apparent distress. Eyes: PERRLA, EOMs, conjunctiva no swelling or erythema Sinuses: No Frontal/maxillary tenderness ENT/Mouth: Ext aud canals clear, TMs without erythema, bulging. No erythema, swelling, or exudate on post pharynx.  Tonsils not swollen or erythematous. Hearing normal.  Neck: Supple, thyroid normal.  Respiratory: Respiratory effort normal, BS equal bilaterally without rales, rhonchi, wheezing or stridor.  Cardio: RRR with no MRGs. Brisk peripheral pulses without edema.  Abdomen: Soft, + BS.  Non tender, no guarding, rebound, hernias, masses. Lymphatics: Non tender without lymphadenopathy.  Musculoskeletal: Full ROM, 5/5 strength, normal gait. Patient is able to ambulate well.   left knee with medial joint line tenderness, patellar laxity noted and tenderness noted medial no effusion, no erythema, ACL stable, MCL stable, LCL stable, McMurray's negative and FROMStrength is normal and symmetric in legs. Skin: Warm, dry without rashes, lesions, ecchymosis.  Neuro: Cranial nerves intact. Normal muscle tone, no cerebellar symptoms. Sensation intact.  Psych: Awake and oriented X 3, normal affect, Insight and Judgment appropriate.    Vicie Mutters, PA-C 5:26 PM [facility]

## 2014-07-07 LAB — CBC WITH DIFFERENTIAL/PLATELET
Basophils Absolute: 0 10*3/uL (ref 0.0–0.1)
Basophils Relative: 0 % (ref 0–1)
EOS ABS: 0.1 10*3/uL (ref 0.0–0.7)
Eosinophils Relative: 2 % (ref 0–5)
HEMATOCRIT: 43.5 % (ref 39.0–52.0)
Hemoglobin: 14.6 g/dL (ref 13.0–17.0)
Lymphocytes Relative: 30 % (ref 12–46)
Lymphs Abs: 2 10*3/uL (ref 0.7–4.0)
MCH: 30.1 pg (ref 26.0–34.0)
MCHC: 33.6 g/dL (ref 30.0–36.0)
MCV: 89.7 fL (ref 78.0–100.0)
MONO ABS: 0.6 10*3/uL (ref 0.1–1.0)
MONOS PCT: 9 % (ref 3–12)
MPV: 11 fL (ref 8.6–12.4)
NEUTROS PCT: 59 % (ref 43–77)
Neutro Abs: 4 10*3/uL (ref 1.7–7.7)
Platelets: 325 10*3/uL (ref 150–400)
RBC: 4.85 MIL/uL (ref 4.22–5.81)
RDW: 12.9 % (ref 11.5–15.5)
WBC: 6.8 10*3/uL (ref 4.0–10.5)

## 2014-07-07 LAB — HEMOGLOBIN A1C
HEMOGLOBIN A1C: 5.7 % — AB (ref <5.7)
MEAN PLASMA GLUCOSE: 117 mg/dL — AB (ref <117)

## 2014-07-07 LAB — BASIC METABOLIC PANEL WITH GFR
BUN: 17 mg/dL (ref 6–23)
CALCIUM: 9.9 mg/dL (ref 8.4–10.5)
CHLORIDE: 102 meq/L (ref 96–112)
CO2: 29 mEq/L (ref 19–32)
Creat: 1.24 mg/dL (ref 0.50–1.35)
GFR, Est African American: 84 mL/min
GFR, Est Non African American: 72 mL/min
GLUCOSE: 86 mg/dL (ref 70–99)
POTASSIUM: 4.3 meq/L (ref 3.5–5.3)
SODIUM: 137 meq/L (ref 135–145)

## 2014-07-07 LAB — MAGNESIUM: MAGNESIUM: 2 mg/dL (ref 1.5–2.5)

## 2014-07-07 LAB — VITAMIN D 25 HYDROXY (VIT D DEFICIENCY, FRACTURES): Vit D, 25-Hydroxy: 51 ng/mL (ref 30–100)

## 2014-07-07 LAB — HEPATIC FUNCTION PANEL
ALBUMIN: 4.3 g/dL (ref 3.5–5.2)
ALT: 33 U/L (ref 0–53)
AST: 26 U/L (ref 0–37)
Alkaline Phosphatase: 59 U/L (ref 39–117)
BILIRUBIN INDIRECT: 0.3 mg/dL (ref 0.2–1.2)
Bilirubin, Direct: 0.1 mg/dL (ref 0.0–0.3)
TOTAL PROTEIN: 7 g/dL (ref 6.0–8.3)
Total Bilirubin: 0.4 mg/dL (ref 0.2–1.2)

## 2014-07-07 LAB — TSH: TSH: 1.725 u[IU]/mL (ref 0.350–4.500)

## 2014-07-07 LAB — LIPID PANEL
Cholesterol: 174 mg/dL (ref 0–200)
HDL: 39 mg/dL — AB (ref >39)
LDL CALC: 93 mg/dL (ref 0–99)
TRIGLYCERIDES: 210 mg/dL — AB (ref <150)
Total CHOL/HDL Ratio: 4.5 Ratio
VLDL: 42 mg/dL — ABNORMAL HIGH (ref 0–40)

## 2014-07-07 LAB — TESTOSTERONE: Testosterone: 302 ng/dL (ref 300–890)

## 2014-07-07 LAB — INSULIN, FASTING: INSULIN FASTING, SERUM: 22.1 u[IU]/mL — AB (ref 2.0–19.6)

## 2014-07-25 ENCOUNTER — Other Ambulatory Visit: Payer: Self-pay | Admitting: Physician Assistant

## 2014-09-23 ENCOUNTER — Ambulatory Visit (INDEPENDENT_AMBULATORY_CARE_PROVIDER_SITE_OTHER): Payer: BLUE CROSS/BLUE SHIELD | Admitting: Internal Medicine

## 2014-09-23 ENCOUNTER — Encounter: Payer: Self-pay | Admitting: Internal Medicine

## 2014-09-23 VITALS — BP 126/68 | HR 72 | Temp 97.5°F | Resp 16 | Ht 69.0 in | Wt 234.0 lb

## 2014-09-23 DIAGNOSIS — I861 Scrotal varices: Secondary | ICD-10-CM

## 2014-09-23 NOTE — Patient Instructions (Signed)
Varicocele A varicocele is a swelling of veins in the scrotum (the bag of skin that contains the testicles). It is most common in young men. It occurs most often on the left side. Small or painless varicoceles do not need treatment. Most often, this is not a serious problem, but further tests may be needed to confirm the diagnosis. Surgery may be needed if complications of varicoceles arise. Rarely, varicoceles can reoccur after surgery. CAUSES  The swelling is due to blood backing up in the vein that leads from the testicle back to the body. Blood backs up because the valves inside the vein are not working properly. Veins normally return blood to the heart. Valves in veins are supposed to be one-way valves. They should not allow blood to flow backwards. If the valves do not work well, blood can pool in a vein and make it swell. The same thing happens with varicose veins in the leg. SYMPTOMS  A varicocele most often causes no symptoms. When they occur, symptoms include:   Swelling on one side of the scrotum.  Swelling that is more obvious when standing up.  A lumpy feeling in the scrotum.  Heaviness on one side of the scrotum.  Dull ache in the scrotum, especially after exercise or prolonged standing or sitting.  Slower growth or reduced size of the testicle on the side of the varicocele (in young males).  Problems with fertility can arise if the testicle does not grow normally. DIAGNOSIS  Varicocele is usually diagnosed by a physical exam. Sometimes ultrasonography is done. TREATMENT  Usually, varicoceles need no treatment. They are often routinely monitored on exam by your caregiver to ensure they do not slow the growth of the testicle on that side. Treatment may be needed if:  The varicocele is large.  There is a lot of pain.  The varicocele causes a decrease in the size of the testicle in a growing adolescent.  The other testicle is absent or not normal.  Varicoceles are found on  both sides of the scrotum.  There is pain when exercising.  There are fertility problems. There are two types of treatment:  Surgery. The surgeon ties off the swollen veins. Surgery may be done with an incision in the skin or through a laparoscope. The surgery is usually done in an outpatient setting. Outpatient means there is no overnight stay in a hospital.  Embolization. A small tube is placed in a vein and guided into the swollen veins. X-rays are used to guide the small tube. Tiny metal coils or other blocking items are put through the tube. This blocks swollen veins and the flow of blood. This is usually done in an outpatient setting without the use of general anesthesia. HOME CARE INSTRUCTIONS  To decrease discomfort:  Wear supportive underwear.  Use an athletic supporter for sports.  Only take over-the-counter or prescription medicines for pain or discomfort as directed by your caregiver. SEEK MEDICAL CARE IF:   Pain is increasing.  Swelling does not decrease when lying down.  Testicle is smaller.  The testicle becomes enlarged, swollen, red, or painful. Document Released: 09/18/2000 Document Revised: 09/04/2011 Document Reviewed: 09/22/2009 [facility] Patient Information 2015 [place], [place]. This information is not intended to replace advice given to you by your health care provider. Make sure you discuss any questions you have with your health care provider.

## 2014-09-23 NOTE — Progress Notes (Signed)
   Subjective:    Patient ID: Neil Henderson, male    DOB: 1976/05/14, 39 y.o.   MRN: [id number]  HPI Patient presents with c/o lump in his left scrotum which he has noted several months getting larger and he's concerned that it might be a cancer. No sx's of prostatism, dysuria or change of urine appearance.  Medication Sig  . atorvastatin (LIPITOR) 80 MG tablet Take 1 tablet daily or as directed for cholesterol  . cholecalciferol (VITAMIN D) 1000 UNITS tablet Take 10,000 Units by mouth daily.  Marland Kitchen lisinopril (PRINIVIL,ZESTRIL) 40 MG tablet TAKE ONE TABLET BY MOUTH ONE TIME DAILY   . testosterone cypionate (DEPOTESTOTERONE CYPIONATE) 200 MG/ML injection inject 27ml intramuscularly every 2 weeks as directed  . varenicline (CHANTIX) 1 MG tablet Take 1 tablet (1 mg total) by mouth 2 (two) times daily.   No Known Allergies   Past Medical History  Diagnosis Date  . Hypertension   . Hyperlipidemia   . Prediabetes   . Hypogonadism male   . Vitamin D deficiency   . Obesity    Review of Systems  10 point systems review negative except as above.     Objective:   Physical Exam  BP 126/68   P 72  T 97.5 F   R 16  Ht 5\' 9"    Wt 234 lb     BMI 34.54  GU - No ing hernias or adenopathy bilaterally. Phallus appears Nl. Right testes/epididymis & cord is Nl.Left testes/epididymis is Nl w/o palpable mass. Left cord is normal with a palpable varicocele at the proximal to mid portion of the left cord.    Assessment & Plan:   1. Left varicocele  - Anatomy, pathology of varicoceles and their natural hx was discussed with patient as he was reassured there was no sign of cancer. He was advised to wear jockey shorts for better support and encouraged monthly STE's.

## 2014-10-07 ENCOUNTER — Ambulatory Visit: Payer: Self-pay | Admitting: Internal Medicine

## 2014-10-07 ENCOUNTER — Ambulatory Visit: Payer: Self-pay | Admitting: Physician Assistant

## 2014-10-21 ENCOUNTER — Encounter: Payer: Self-pay | Admitting: Internal Medicine

## 2014-10-21 ENCOUNTER — Ambulatory Visit (INDEPENDENT_AMBULATORY_CARE_PROVIDER_SITE_OTHER): Payer: BLUE CROSS/BLUE SHIELD | Admitting: Internal Medicine

## 2014-10-21 VITALS — BP 122/70 | HR 74 | Temp 98.2°F | Resp 18 | Ht 69.0 in | Wt 228.0 lb

## 2014-10-21 DIAGNOSIS — R7303 Prediabetes: Secondary | ICD-10-CM

## 2014-10-21 DIAGNOSIS — E291 Testicular hypofunction: Secondary | ICD-10-CM

## 2014-10-21 DIAGNOSIS — N5089 Other specified disorders of the male genital organs: Secondary | ICD-10-CM

## 2014-10-21 DIAGNOSIS — R7309 Other abnormal glucose: Secondary | ICD-10-CM

## 2014-10-21 DIAGNOSIS — E669 Obesity, unspecified: Secondary | ICD-10-CM

## 2014-10-21 DIAGNOSIS — E782 Mixed hyperlipidemia: Secondary | ICD-10-CM

## 2014-10-21 DIAGNOSIS — E559 Vitamin D deficiency, unspecified: Secondary | ICD-10-CM

## 2014-10-21 DIAGNOSIS — N509 Disorder of male genital organs, unspecified: Secondary | ICD-10-CM

## 2014-10-21 DIAGNOSIS — I10 Essential (primary) hypertension: Secondary | ICD-10-CM

## 2014-10-21 DIAGNOSIS — Z79899 Other long term (current) drug therapy: Secondary | ICD-10-CM

## 2014-10-21 LAB — CBC WITH DIFFERENTIAL/PLATELET
BASOS ABS: 0 10*3/uL (ref 0.0–0.1)
BASOS PCT: 0 % (ref 0–1)
EOS ABS: 0.2 10*3/uL (ref 0.0–0.7)
Eosinophils Relative: 2 % (ref 0–5)
HCT: 52.3 % — ABNORMAL HIGH (ref 39.0–52.0)
HEMOGLOBIN: 17.5 g/dL — AB (ref 13.0–17.0)
Lymphocytes Relative: 25 % (ref 12–46)
Lymphs Abs: 2 10*3/uL (ref 0.7–4.0)
MCH: 30.2 pg (ref 26.0–34.0)
MCHC: 33.5 g/dL (ref 30.0–36.0)
MCV: 90.2 fL (ref 78.0–100.0)
MONO ABS: 0.9 10*3/uL (ref 0.1–1.0)
MONOS PCT: 11 % (ref 3–12)
MPV: 12 fL (ref 8.6–12.4)
Neutro Abs: 5 10*3/uL (ref 1.7–7.7)
Neutrophils Relative %: 62 % (ref 43–77)
Platelets: 291 10*3/uL (ref 150–400)
RBC: 5.8 MIL/uL (ref 4.22–5.81)
RDW: 13.1 % (ref 11.5–15.5)
WBC: 8.1 10*3/uL (ref 4.0–10.5)

## 2014-10-21 LAB — LIPID PANEL
CHOL/HDL RATIO: 6.6 ratio
CHOLESTEROL: 190 mg/dL (ref 0–200)
HDL: 29 mg/dL — ABNORMAL LOW (ref >=40)
LDL Cholesterol: 107 mg/dL — ABNORMAL HIGH (ref 0–99)
TRIGLYCERIDES: 269 mg/dL — AB (ref <150)
VLDL: 54 mg/dL — AB (ref 0–40)

## 2014-10-21 LAB — MAGNESIUM: Magnesium: 2.1 mg/dL (ref 1.5–2.5)

## 2014-10-21 LAB — HEPATIC FUNCTION PANEL
ALK PHOS: 58 U/L (ref 39–117)
ALT: 42 U/L (ref 0–53)
AST: 33 U/L (ref 0–37)
Albumin: 4.7 g/dL (ref 3.5–5.2)
Bilirubin, Direct: 0.1 mg/dL (ref 0.0–0.3)
Indirect Bilirubin: 0.5 mg/dL (ref 0.2–1.2)
TOTAL PROTEIN: 7.4 g/dL (ref 6.0–8.3)
Total Bilirubin: 0.6 mg/dL (ref 0.2–1.2)

## 2014-10-21 LAB — BASIC METABOLIC PANEL WITH GFR
BUN: 20 mg/dL (ref 6–23)
CALCIUM: 10.3 mg/dL (ref 8.4–10.5)
CO2: 27 meq/L (ref 19–32)
CREATININE: 1.31 mg/dL (ref 0.50–1.35)
Chloride: 102 mEq/L (ref 96–112)
GFR, EST AFRICAN AMERICAN: 78 mL/min
GFR, Est Non African American: 68 mL/min
GLUCOSE: 91 mg/dL (ref 70–99)
Potassium: 4.8 mEq/L (ref 3.5–5.3)
Sodium: 138 mEq/L (ref 135–145)

## 2014-10-21 MED ORDER — DOXYCYCLINE HYCLATE 100 MG PO CAPS: 100.0000 mg | ORAL_CAPSULE | Freq: Two times a day (BID) | ORAL | Status: DC

## 2014-10-21 NOTE — Progress Notes (Signed)
Patient ID: Neil Henderson, male   DOB: 05-27-1976, 39 y.o.   MRN: [id number]  Assessment and Plan:  1. Essential hypertension -Continue medication,  -monitor blood pressure at home.  -Continue DASH diet.   -Reminder to go to the ER if any CP, SOB, nausea, dizziness, severe HA, changes vision/speech, left arm numbness and tingling, and jaw pain.  2. Testosterone Deficiency -cont supplement  3. Obesity -diet discussed extensively  4. Hyperlipidemia -lipitor - Lipid panel  5. Vitamin D deficiency -cont supplement - Vitamin D 1,25 dihydroxy  6. Encounter for long-term (current) use of medications  - CBC with Differential/Platelet - BASIC METABOLIC PANEL WITH GFR - Hepatic function panel - Magnesium  7. Prediabetes  - Hemoglobin A1c - Insulin, random  8. Scrotal irritation -Doxycycline I-f no better likely referral to urology    Continue diet and meds as discussed. Further disposition pending results of labs.  HPI 39 y.o. male  presents for 3 month follow up with hypertension, hyperlipidemia, prediabetes and vitamin D.   His blood pressure has been controlled at home, today their BP is BP: 122/70 mmHg.   He does not workout.  He is walking a lot at work.  He has been doing 60-70 hrs per work.   He denies chest pain, shortness of breath, dizziness.   He is on cholesterol medication and denies myalgias. His cholesterol is at goal. The cholesterol last visit was:   Lab Results  Component Value Date   CHOL 174 07/06/2014   HDL 39* 07/06/2014   LDLCALC 93 07/06/2014   TRIG 210* 07/06/2014   CHOLHDL 4.5 07/06/2014  He is not having muscle cramping or aching.     He has not been working on diet and exercise for prediabetes, and denies foot ulcerations, hypoglycemia , increased appetite, nausea, paresthesia of the feet, polydipsia, polyuria, visual disturbances, vomiting and weight loss. Last A1C in the office was:  Lab Results  Component Value Date   HGBA1C 5.7*  07/06/2014    Patient is on Vitamin D supplement.  Lab Results  Component Value Date   VD25OH 51 07/06/2014     Patient was seen several weeks ago and diagnosed with a varicocele.  He states that he did have some some swelling around it and then it ruptured and bled several times.    Current Medications:  Current Outpatient Prescriptions on File Prior to Visit  Medication Sig Dispense Refill  . cholecalciferol (VITAMIN D) 1000 UNITS tablet Take 10,000 Units by mouth daily.    Marland Kitchen lisinopril (PRINIVIL,ZESTRIL) 40 MG tablet TAKE ONE TABLET BY MOUTH ONE TIME DAILY  90 tablet 1  . testosterone cypionate (DEPOTESTOTERONE CYPIONATE) 200 MG/ML injection inject 16ml intramuscularly every 2 weeks as directed 10 mL 2  . varenicline (CHANTIX) 1 MG tablet Take 1 tablet (1 mg total) by mouth 2 (two) times daily. 60 tablet 5  . atorvastatin (LIPITOR) 80 MG tablet Take 1 tablet daily or as directed for cholesterol (Patient not taking: Reported on 10/21/2014) 30 tablet 11   No current facility-administered medications on file prior to visit.    Medical History:  Past Medical History  Diagnosis Date  . Hypertension   . Hyperlipidemia   . Prediabetes   . Hypogonadism male   . Vitamin D deficiency   . Obesity     Allergies: No Known Allergies   Review of Systems:  Review of Systems  Constitutional: Negative for fever, chills, weight loss and malaise/fatigue.  HENT: Negative for congestion,  ear pain and sore throat.   Eyes: Negative for discharge.  Respiratory: Negative for cough, shortness of breath and wheezing.   Cardiovascular: Negative for chest pain, palpitations and leg swelling.  Gastrointestinal: Positive for heartburn. Negative for nausea, vomiting, abdominal pain, diarrhea, constipation, blood in stool and melena.  Genitourinary: Negative for dysuria, urgency, frequency and hematuria.  Musculoskeletal: Negative.   Skin: Negative.   Neurological: Positive for headaches. Negative for  dizziness, tingling, sensory change and loss of consciousness.    Family history- Review and unchanged  Social history- Review and unchanged  Physical Exam: BP 122/70 mmHg  Pulse 74  Temp(Src) 98.2 F (36.8 C) (Temporal)  Resp 18  Ht 5\' 9"  (1.753 m)  Wt 228 lb (103.42 kg)  BMI 33.65 kg/m2 Wt Readings from Last 3 Encounters:  10/21/14 228 lb (103.42 kg)  09/23/14 234 lb (106.142 kg)  07/06/14 230 lb (104.327 kg)    General Appearance: Well nourished well developed, in no apparent distress. Eyes: PERRLA, EOMs, conjunctiva no swelling or erythema ENT/Mouth: Ear canals normal without obstruction, swelling, erythma, discharge.  TMs normal bilaterally.  Oropharynx moist, clear, without exudate, or postoropharyngeal swelling. Neck: Supple, thyroid normal,no cervical adenopathy  Respiratory: Respiratory effort normal, Breath sounds clear A&P without rhonchi, wheeze, or rale.  No retractions, no accessory usage. Cardio: RRR with no MRGs. Brisk peripheral pulses without edema.  Abdomen: Soft, + BS,  Non tender, no guarding, rebound, hernias, masses. Musculoskeletal: Full ROM, 5/5 strength, Normal gait Skin: Warm, dry without rashes,ecchymosis.  Genitalia:  Patient deferred patient preferred to just do abx Neuro: Awake and oriented X 3, Cranial nerves intact. Normal muscle tone, no cerebellar symptoms. Psych: Normal affect, Insight and Judgment appropriate.    FORCUCCI, Lavida Patch, PA-C 5:25 PM [facility]

## 2014-10-22 ENCOUNTER — Other Ambulatory Visit: Payer: Self-pay | Admitting: *Deleted

## 2014-10-22 LAB — HEMOGLOBIN A1C
Hgb A1c MFr Bld: 5.4 % (ref <5.7)
Mean Plasma Glucose: 108 mg/dL (ref <117)

## 2014-10-22 LAB — INSULIN, RANDOM: Insulin: 12.5 u[IU]/mL (ref 2.0–19.6)

## 2014-10-22 MED ORDER — DOXYCYCLINE HYCLATE 100 MG PO CAPS: 100.0000 mg | ORAL_CAPSULE | Freq: Two times a day (BID) | ORAL | Status: DC

## 2014-10-23 ENCOUNTER — Other Ambulatory Visit: Payer: Self-pay | Admitting: Internal Medicine

## 2014-10-23 MED ORDER — ATORVASTATIN CALCIUM 80 MG PO TABS: ORAL_TABLET | ORAL | Status: DC

## 2014-10-24 LAB — VITAMIN D 1,25 DIHYDROXY
VITAMIN D 1, 25 (OH) TOTAL: 63 pg/mL (ref 18–72)
Vitamin D3 1, 25 (OH)2: 63 pg/mL

## 2014-11-03 ENCOUNTER — Encounter: Payer: Self-pay | Admitting: Internal Medicine

## 2014-11-05 ENCOUNTER — Ambulatory Visit (INDEPENDENT_AMBULATORY_CARE_PROVIDER_SITE_OTHER): Payer: BLUE CROSS/BLUE SHIELD | Admitting: Internal Medicine

## 2014-11-05 ENCOUNTER — Ambulatory Visit: Payer: Self-pay | Admitting: Internal Medicine

## 2014-11-05 ENCOUNTER — Encounter: Payer: Self-pay | Admitting: Internal Medicine

## 2014-11-05 VITALS — BP 138/80 | HR 76 | Temp 97.7°F | Resp 16 | Ht 69.0 in | Wt 232.0 lb

## 2014-11-05 DIAGNOSIS — M542 Cervicalgia: Secondary | ICD-10-CM

## 2014-11-05 DIAGNOSIS — L02214 Cutaneous abscess of groin: Secondary | ICD-10-CM

## 2014-11-05 MED ORDER — SULFAMETHOXAZOLE-TRIMETHOPRIM 800-160 MG PO TABS: ORAL_TABLET | ORAL | Status: DC

## 2014-11-05 MED ORDER — CYCLOBENZAPRINE HCL 10 MG PO TABS: 10.0000 mg | ORAL_TABLET | Freq: Three times a day (TID) | ORAL | Status: DC | PRN

## 2014-11-05 MED ORDER — LEVOFLOXACIN 750 MG PO TABS: ORAL_TABLET | ORAL | Status: DC

## 2014-11-05 MED ORDER — PREDNISONE 20 MG PO TABS: ORAL_TABLET | ORAL | Status: DC

## 2014-11-05 NOTE — Progress Notes (Signed)
   Subjective:    Patient ID: Neil Henderson, male    DOB: 1976-03-13, 39 y.o.   MRN: [id number]  HPI Patient tx'd recently for a left scrotal skin infection and despite appropriate Abx with Doxycycline his sx's seemed to progress.   Patient is also s/p prior Cx Dsk Sx and has had recent c/o lower posterior neck & left scapular pains similar to when po. neck sx.  Medication Sig  . atorvastatin (LIPITOR) 80 MG tablet Take 1 tablet daily or as directed for cholesterol  . cholecalciferol (VITAMIN D) 1000 UNITS tablet Take 10,000 Units by mouth daily.  Marland Kitchen lisinopril (PRINIVIL,ZESTRIL) 40 MG tablet TAKE ONE TABLET BY MOUTH ONE TIME DAILY   . testosterone cypionate (DEPOTESTOTERONE CYPIONATE) 200 MG/ML injection inject 71ml intramuscularly every 2 weeks as directed  . varenicline (CHANTIX) 1 MG tablet Take 1 tablet (1 mg total) by mouth 2 (two) times daily.  Marland Kitchen doxycycline (VIBRAMYCIN) 100 MG capsule Take 1 capsule (100 mg total) by mouth 2 (two) times daily. One po bid x 7 days   No Known Allergies   Past Medical History  Diagnosis Date  . Hypertension   . Hyperlipidemia   . Prediabetes   . Hypogonadism male   . Vitamin D deficiency   . Obesity    Review of Systems 10 point systems review negative except as above.    Objective:   Physical Exam  BP 138/80 mmHg  Pulse 76  Temp(Src) 97.7 F (36.5 C)  Resp 16  Ht 5\' 9"  (1.753 m)  Wt 232 lb (105.235 kg)  BMI 34.24 kg/m2  There is an open draining sinus tract along the left cruroscrotal fold of a sl cloudy thin yellowish brown serous drainage which is cultured. No signs of a cellulitis or lymphangitis.     Assessment & Plan:   1. Abscess of groin, left  - levofloxacin (LEVAQUIN) 750 MG tablet; Take 1 tablet daily with food for skin infection  Dispense: 10 tablet; Refill: 1 - sulfamethoxazole-trimethoprim (BACTRIM DS,SEPTRA DS) 800-160 MG per tablet; Take 1 tablet 2 x daily with food for skin infection  Dispense: 20 tablet; Refill:  1 - Wound culture  2. Cervicalgia  - predniSONE (DELTASONE) 20 MG tablet; 1 tab 3 x day for 3 days, then 1 tab 2 x day for 3 days, then 1 tab 1 x day for 5 days  Dispense: 20 tablet; Refill: 0 - cyclobenzaprine (FLEXERIL) 10 MG tablet; Take 1 tablet (10 mg total) by mouth every 8 (eight) hours as needed for muscle spasms.  Dispense: 50 tablet; Refill: 1 - Recc heat  ROV - 10 days to recheck.

## 2014-11-05 NOTE — Patient Instructions (Signed)

## 2014-11-07 LAB — WOUND CULTURE: GRAM STAIN: NONE SEEN

## 2014-11-16 ENCOUNTER — Ambulatory Visit (INDEPENDENT_AMBULATORY_CARE_PROVIDER_SITE_OTHER): Payer: BLUE CROSS/BLUE SHIELD | Admitting: Internal Medicine

## 2014-11-16 ENCOUNTER — Encounter: Payer: Self-pay | Admitting: Internal Medicine

## 2014-11-16 VITALS — BP 142/68 | HR 80 | Temp 98.3°F | Resp 16 | Ht 69.0 in | Wt 229.2 lb

## 2014-11-16 DIAGNOSIS — L02214 Cutaneous abscess of groin: Secondary | ICD-10-CM

## 2014-11-16 DIAGNOSIS — D485 Neoplasm of uncertain behavior of skin: Secondary | ICD-10-CM

## 2014-11-16 MED ORDER — AMOXICILLIN 500 MG PO TABS: ORAL_TABLET | ORAL | Status: DC

## 2014-11-16 NOTE — Progress Notes (Signed)
   Subjective:    Patient ID: Neil Henderson, male    DOB: 06/15/1976, 39 y.o.   MRN: [id number]  HPI  Pt returns for 10 day f/u of a draining cyst/cellulitis of his left crural scrotal area extending into the scrotum skin. Culture grew alpha strep and patient had been started on dual therapy with Levaquin/Septra completing a 10 day course today. He reports he had prompt recovery with only a slight degree of residual tenderness.   He also presents with concerns re: a 12 x 12 mm hemangioma of the right upper chest which he feels has recently change in size.  Medication Sig  . atorvastatin (LIPITOR) 80 MG tablet Take 1 tablet daily or as directed for cholesterol  . cholecalciferol (VITAMIN D) 1000 UNITS tablet Take 10,000 Units by mouth daily.  . cyclobenzaprine (FLEXERIL) 10 MG tablet Take 1 tablet (10 mg total) by mouth every 8 (eight) hours as needed for muscle spasms.  . fenofibrate micronized (LOFIBRA) 134 MG capsule   . lisinopril (PRINIVIL,ZESTRIL) 40 MG tablet TAKE ONE TABLET BY MOUTH ONE TIME DAILY   . DEPO-TESTOTERONE  200 MG/ML injection inject 28ml intramuscularly every 2 weeks as directed  . varenicline (CHANTIX) 1 MG tablet Take 1 tablet (1 mg total) by mouth 2 (two) times daily.   No Known Allergies   Past Medical History  Diagnosis Date  . Hypertension   . Hyperlipidemia   . Prediabetes   . Hypogonadism male   . Vitamin D deficiency   . Obesity    Review of Systems In addition to the HPI above,  No Fever-chills,  No Headache, No changes with Vision or hearing,  No problems swallowing food or Liquids,  No Chest pain or productive Cough or Shortness of Breath,  No Abdominal pain, No Nausea or Vomitting, Bowel movements are regular,  No Blood in stool or Urine,  No dysuria,  No new skin rashes or bruises,  No new joints pains-aches,  No new weakness, tingling, numbness in any extremity,  No recent weight loss,  No polyuria, polydypsia or polyphagia,  No significant  Mental Stressors.  A full 10 point Review of Systems was done, except as stated above, all other Review of Systems were negative    Objective:   Physical Exam  BP 142/68   Pulse 80  Temp(Src) 98.3 F  Resp 16  Ht 5\' 9"    Wt 229 lb 3.2 oz     BMI 33.83   Gu exam finds left scrotal draining sinus tract has disappeared and no more induration is evident, but a slight amount of tenderness persists.  There is a 12 mm x  12 mm  raised dark red fleshy lesion of the upper right para-sternal area.   After informed consent and aseptic prep with alcohol and the local anesthesia with 0.6 ml of Marcaine 0.5% w/epi the area was excised by shave excisional technique. Thereafter the area was deeply hyfrecated to fulgurate any residual lesion. Sterile bandaged were placed over Antibiotic ung and patient was instructed in wound care.     Assessment & Plan:   1. Abscess of groin, left  - Discussed continuing Amoxil for 7-10 days or until all tenderness resolved.   - amoxicillin (AMOXIL) 500 MG tablet; Take 1 capsule 3 x day after a meal for skin infection  Dispense: 42 tablet; Refill: 1  2. Neoplasm of uncertain behavior of skin  - Prob benign Hemangioma.

## 2014-12-10 ENCOUNTER — Other Ambulatory Visit: Payer: Self-pay | Admitting: Internal Medicine

## 2014-12-29 ENCOUNTER — Ambulatory Visit (INDEPENDENT_AMBULATORY_CARE_PROVIDER_SITE_OTHER): Payer: BLUE CROSS/BLUE SHIELD | Admitting: Internal Medicine

## 2014-12-29 ENCOUNTER — Encounter: Payer: Self-pay | Admitting: Internal Medicine

## 2014-12-29 VITALS — BP 140/82 | HR 80 | Temp 97.7°F | Resp 16 | Ht 70.0 in | Wt 233.8 lb

## 2014-12-29 DIAGNOSIS — R5383 Other fatigue: Secondary | ICD-10-CM

## 2014-12-29 DIAGNOSIS — Z1212 Encounter for screening for malignant neoplasm of rectum: Secondary | ICD-10-CM

## 2014-12-29 DIAGNOSIS — E291 Testicular hypofunction: Secondary | ICD-10-CM

## 2014-12-29 DIAGNOSIS — E559 Vitamin D deficiency, unspecified: Secondary | ICD-10-CM

## 2014-12-29 DIAGNOSIS — R7303 Prediabetes: Secondary | ICD-10-CM

## 2014-12-29 DIAGNOSIS — I10 Essential (primary) hypertension: Secondary | ICD-10-CM

## 2014-12-29 DIAGNOSIS — Z6833 Body mass index (BMI) 33.0-33.9, adult: Secondary | ICD-10-CM

## 2014-12-29 DIAGNOSIS — E782 Mixed hyperlipidemia: Secondary | ICD-10-CM

## 2014-12-29 DIAGNOSIS — R7309 Other abnormal glucose: Secondary | ICD-10-CM

## 2014-12-29 DIAGNOSIS — Z111 Encounter for screening for respiratory tuberculosis: Secondary | ICD-10-CM

## 2014-12-29 DIAGNOSIS — Z125 Encounter for screening for malignant neoplasm of prostate: Secondary | ICD-10-CM

## 2014-12-29 DIAGNOSIS — Z79899 Other long term (current) drug therapy: Secondary | ICD-10-CM

## 2014-12-29 LAB — BASIC METABOLIC PANEL WITH GFR
BUN: 11 mg/dL (ref 6–23)
CALCIUM: 10 mg/dL (ref 8.4–10.5)
CO2: 27 mEq/L (ref 19–32)
Chloride: 101 mEq/L (ref 96–112)
Creat: 1.24 mg/dL (ref 0.50–1.35)
GFR, EST AFRICAN AMERICAN: 83 mL/min
GFR, Est Non African American: 72 mL/min
Glucose, Bld: 88 mg/dL (ref 70–99)
Potassium: 4.4 mEq/L (ref 3.5–5.3)
SODIUM: 138 meq/L (ref 135–145)

## 2014-12-29 LAB — HEPATIC FUNCTION PANEL
ALT: 59 U/L — ABNORMAL HIGH (ref 0–53)
AST: 43 U/L — ABNORMAL HIGH (ref 0–37)
Albumin: 4.1 g/dL (ref 3.5–5.2)
Alkaline Phosphatase: 55 U/L (ref 39–117)
BILIRUBIN INDIRECT: 0.6 mg/dL (ref 0.2–1.2)
Bilirubin, Direct: 0.1 mg/dL (ref 0.0–0.3)
Total Bilirubin: 0.7 mg/dL (ref 0.2–1.2)
Total Protein: 6.8 g/dL (ref 6.0–8.3)

## 2014-12-29 LAB — IRON AND TIBC
%SAT: 25 % (ref 20–55)
IRON: 90 ug/dL (ref 42–165)
TIBC: 360 ug/dL (ref 215–435)
UIBC: 270 ug/dL (ref 125–400)

## 2014-12-29 LAB — LIPID PANEL
CHOLESTEROL: 183 mg/dL (ref 0–200)
HDL: 25 mg/dL — ABNORMAL LOW (ref >=40)
LDL CALC: 94 mg/dL (ref 0–99)
TRIGLYCERIDES: 320 mg/dL — AB (ref <150)
Total CHOL/HDL Ratio: 7.3 Ratio
VLDL: 64 mg/dL — AB (ref 0–40)

## 2014-12-29 LAB — MAGNESIUM: MAGNESIUM: 2 mg/dL (ref 1.5–2.5)

## 2014-12-29 NOTE — Patient Instructions (Signed)

## 2014-12-29 NOTE — Progress Notes (Signed)
Patient ID: Neil Henderson, male   DOB: 08-Mar-1976, 39 y.o.   MRN: [id number]   Annual Comprehensive Examination  This very nice 39 y.o. MWM presents for complete physical.  Patient has been followed for HTN, Prediabetes, Hyperlipidemia, Testosterone and Vitamin D Deficiency.   HTN predates since 2007. Patient's BP has been controlled at home.Today's BP: 140/82 mmHg. Patient denies any cardiac symptoms as chest pain, palpitations, shortness of breath, dizziness or ankle swelling.   Patient's hyperlipidemia is controlled with diet and medications. Patient denies myalgias or other medication SE's. Last lipids were near goal with Cholesterol 190; HDL 29*; LDL Cholesterol 107; and elevated Triglycerides 269 on 10/21/2014.   Patient has prediabetes since Mar 2013 with A1c 5.7% and patient denies reactive hypoglycemic symptoms, visual blurring, diabetic polys or paresthesias. Last A1c was 5.4% on 10/21/2014.   Patient had Low T = 217.54  in 2012 and 202  In Apr 2014 and has been on replacement therapy with improved stamina and sense of well-being.  Finally, patient has history of Vitamin D Deficiency of 13 in 2008 and last vitamin D was  51 on 07/06/2014.      Medication Sig  . atorvastatin  80 MG tablet Take 1 tablet daily or as directed for cholesterol  . VITAMIN D 1000 UNITS tablet Take 10,000 Units by mouth daily.  . cyclobenzaprine  10 MG tablet Take 1 tablet (10 mg total) by mouth every 8 (eight) hours as needed for muscle spasms.  . fenofibrate 134 MG capsule TAKE ONE CAPSULE BY MOUTH ONE TIME DAILY BEFORE BREAKFAST  . lisinopril  40 MG tablet TAKE ONE TABLET BY MOUTH ONE TIME DAILY   . DEPO-TESTOTERONE 200 MG/ML injection inject 86ml intramuscularly every 2 weeks as directed  . varenicline (CHANTIX) 1 MG tablet Take 1 tablet (1 mg total) by mouth 2 (two) times daily.   No Known Allergies   Past Medical History  Diagnosis Date  . Hypertension   . Hyperlipidemia   . Prediabetes   .  Hypogonadism male   . Vitamin D deficiency   . Obesity    Health Maintenance  Topic Date Due  . INFLUENZA VACCINE  01/25/2015  . TETANUS/TDAP  12/04/2021  . HIV Screening  Completed   Immunization History  Administered Date(s) Administered  . Influenza Split 03/31/2014  . PPD Test 12/24/2013, 12/29/2014  . Pneumococcal-Unspecified 12/05/2011  . Tdap 12/05/2011   Family History  Problem Relation Age of Onset  . Hyperlipidemia Mother   . Hyperlipidemia Father   . Cancer Paternal Grandfather     Lung   History   Social History  . Marital Status: Married    Spouse Name: N/A  . Number of Children: N/A  . Years of Education: N/A   Occupational History  . 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 Smoking status: Current Every Day Smoker    Types: Cigarettes  . Smokeless tobacco: Former Systems developer    Quit date: 11/25/2014  . Alcohol Use: 2.4 oz/week    4 Standard drinks or equivalent per week     Comment: moderate  . Drug Use: 2.00 per week    Special: Marijuana  . Sexual Activity: Active    ROS Constitutional: Denies fever, chills, weight loss/gain, headaches, insomnia,  night sweats or change in appetite. Does c/o fatigue. Eyes: Denies redness, blurred vision, diplopia, discharge, itchy or watery eyes.  ENT: Denies discharge, congestion, post nasal drip, epistaxis,  sore throat, earache, hearing loss, dental pain, Tinnitus, Vertigo, Sinus pain or snoring.  Cardio: Denies chest pain, palpitations, irregular heartbeat, syncope, dyspnea, diaphoresis, orthopnea, PND, claudication or edema Respiratory: denies cough, dyspnea, DOE, pleurisy, hoarseness, laryngitis or wheezing.  Gastrointestinal: Denies dysphagia, heartburn, reflux, water brash, pain, cramps, nausea, vomiting, bloating, diarrhea, constipation, hematemesis, melena, hematochezia, jaundice or hemorrhoids Genitourinary: Denies dysuria, frequency, urgency, nocturia, hesitancy, discharge,  hematuria or flank pain Musculoskeletal: Denies arthralgia, myalgia, stiffness, Jt. Swelling, pain, limp or strain/sprain. Denies Falls. Skin: Denies puritis, rash, hives, warts, acne, eczema or change in skin lesion Neuro: No weakness, tremor, incoordination, spasms, paresthesia or pain Psychiatric: Denies confusion, memory loss or sensory loss. Denies Depression. Endocrine: Denies change in weight, skin, hair change, nocturia, and paresthesia, diabetic polys, visual blurring or hyper / hypo glycemic episodes.  Heme/Lymph: No excessive bleeding, bruising or enlarged lymph nodes.  Physical Exam  BP 140/82   Pulse 80  Temp 97.7 F   Resp 16  Ht 5\' 10"   Wt 233 lb 12.8 oz     BMI 33.55   General Appearance: Well nourished, in no apparent distress. Eyes: PERRLA, EOMs, conjunctiva no swelling or erythema, normal fundi and vessels. Sinuses: No frontal/maxillary tenderness ENT/Mouth: EACs patent / TMs  nl. Nares clear without erythema, swelling, mucoid exudates. Oral hygiene is good. No erythema, swelling, or exudate. Tongue normal, non-obstructing. Tonsils not swollen or erythematous. Hearing normal.  Neck: Supple, thyroid normal. No bruits, nodes or JVD. Respiratory: Respiratory effort normal.  BS equal and clear bilateral without rales, rhonci, wheezing or stridor. Cardio: Heart sounds are normal with regular rate and rhythm and no murmurs, rubs or gallops. Peripheral pulses are normal and equal bilaterally without edema. No aortic or femoral bruits. Chest: symmetric with normal excursions and percussion.  Abdomen: Flat, soft, with bowel sounds. Nontender, no guarding, rebound, hernias, masses, or organomegaly.  Lymphatics: Non tender without lymphadenopathy.  Genitourinary: No hernias.Testes nl. DRE - prostate nl for age - smooth & firm w/o nodules. Musculoskeletal: Full ROM all peripheral extremities, joint stability, 5/5 strength, and normal gait. Skin: Warm and dry without rashes,  lesions, cyanosis, clubbing or  ecchymosis.  Neuro: Cranial nerves intact, reflexes equal bilaterally. Normal muscle tone, no cerebellar symptoms. Sensation intact.  Pysch: Awake and oriented X 3 with normal affect, insight and judgment appropriate.   Assessment and Plan  1. Essential hypertension  - Microalbumin / creatinine urine ratio - EKG 12-Lead - [place], RETROPERITNL ABD,  LTD - TSH  2. Hyperlipidemia  - Lipid panel  3. Prediabetes  - Hemoglobin A1c - Insulin, random  4. Vitamin D deficiency  - Vit D  25 hydroxy (rtn osteoporosis monitoring)  5. Testosterone Deficiency  - Testosterone  6. Morbid obesity   7. Screening for rectal cancer  - POC Hemoccult Bld/Stl (3-Cd Home Screen); Future  8. Prostate cancer screening  - PSA  9. Screening examination for pulmonary tuberculosis  - PPD  10. Other fatigue  - Vitamin B12 - Iron and TIBC - TSH  11. Encounter for long-term (current) use of medications  - Urine Microscopic - CBC with Differential/Platelet - BASIC METABOLIC PANEL WITH GFR - Hepatic function panel - Magnesium  12. BMI 33.55 ,adult   Continue prudent diet as discussed, weight control, BP monitoring, regular exercise, and medications as discussed.  Discussed med effects and SE's. Routine screening labs and tests as requested with regular follow-up as recommended.  Over 40 minutes of exam, counseling &  chart review was performed

## 2014-12-30 LAB — CBC WITH DIFFERENTIAL/PLATELET
Basophils Absolute: 0.1 10*3/uL (ref 0.0–0.1)
Basophils Relative: 1 % (ref 0–1)
EOS PCT: 2 % (ref 0–5)
Eosinophils Absolute: 0.2 10*3/uL (ref 0.0–0.7)
HCT: 50.2 % (ref 39.0–52.0)
HEMOGLOBIN: 17 g/dL (ref 13.0–17.0)
LYMPHS ABS: 1.6 10*3/uL (ref 0.7–4.0)
Lymphocytes Relative: 20 % (ref 12–46)
MCH: 30.1 pg (ref 26.0–34.0)
MCHC: 33.9 g/dL (ref 30.0–36.0)
MCV: 89 fL (ref 78.0–100.0)
MONO ABS: 1.3 10*3/uL — AB (ref 0.1–1.0)
MPV: 11.9 fL (ref 8.6–12.4)
Monocytes Relative: 17 % — ABNORMAL HIGH (ref 3–12)
Neutro Abs: 4.7 10*3/uL (ref 1.7–7.7)
Neutrophils Relative %: 60 % (ref 43–77)
Platelets: 275 10*3/uL (ref 150–400)
RBC: 5.64 MIL/uL (ref 4.22–5.81)
RDW: 13.5 % (ref 11.5–15.5)
WBC: 7.9 10*3/uL (ref 4.0–10.5)

## 2014-12-30 LAB — PSA: PSA: 1.24 ng/mL (ref <=4.00)

## 2014-12-30 LAB — URINALYSIS, MICROSCOPIC ONLY
Bacteria, UA: NONE SEEN
Casts: NONE SEEN
Crystals: NONE SEEN
SQUAMOUS EPITHELIAL / LPF: NONE SEEN

## 2014-12-30 LAB — INSULIN, RANDOM: Insulin: 44.8 u[IU]/mL — ABNORMAL HIGH (ref 2.0–19.6)

## 2014-12-30 LAB — HEMOGLOBIN A1C
Hgb A1c MFr Bld: 5.8 % — ABNORMAL HIGH (ref <5.7)
Mean Plasma Glucose: 120 mg/dL — ABNORMAL HIGH (ref <117)

## 2014-12-30 LAB — TSH: TSH: 1.657 u[IU]/mL (ref 0.350–4.500)

## 2014-12-30 LAB — MICROALBUMIN / CREATININE URINE RATIO
CREATININE, URINE: 101.9 mg/dL
MICROALB UR: 0.3 mg/dL (ref <2.0)
Microalb Creat Ratio: 2.9 mg/g (ref 0.0–30.0)

## 2014-12-30 LAB — VITAMIN D 25 HYDROXY (VIT D DEFICIENCY, FRACTURES): Vit D, 25-Hydroxy: 54 ng/mL (ref 30–100)

## 2014-12-30 LAB — VITAMIN B12: Vitamin B-12: 478 pg/mL (ref 211–911)

## 2014-12-30 LAB — TESTOSTERONE: Testosterone: 581 ng/dL (ref 300–890)

## 2015-01-06 ENCOUNTER — Other Ambulatory Visit: Payer: Self-pay | Admitting: Emergency Medicine

## 2015-01-06 ENCOUNTER — Other Ambulatory Visit: Payer: Self-pay | Admitting: Physician Assistant

## 2015-01-21 ENCOUNTER — Other Ambulatory Visit: Payer: Self-pay | Admitting: Internal Medicine

## 2015-03-15 ENCOUNTER — Encounter: Payer: Self-pay | Admitting: Internal Medicine

## 2015-03-15 DIAGNOSIS — N528 Other male erectile dysfunction: Secondary | ICD-10-CM

## 2015-03-16 ENCOUNTER — Other Ambulatory Visit: Payer: Self-pay | Admitting: Internal Medicine

## 2015-03-16 DIAGNOSIS — N528 Other male erectile dysfunction: Secondary | ICD-10-CM

## 2015-03-16 MED ORDER — SILDENAFIL CITRATE 20 MG PO TABS: ORAL_TABLET | ORAL | Status: DC

## 2015-03-19 ENCOUNTER — Other Ambulatory Visit: Payer: Self-pay | Admitting: Internal Medicine

## 2015-03-24 MED ORDER — SILDENAFIL CITRATE 20 MG PO TABS: ORAL_TABLET | ORAL | Status: DC

## 2015-04-05 ENCOUNTER — Ambulatory Visit: Payer: Self-pay | Admitting: Internal Medicine

## 2015-04-14 ENCOUNTER — Encounter: Payer: Self-pay | Admitting: Internal Medicine

## 2015-04-14 ENCOUNTER — Ambulatory Visit (INDEPENDENT_AMBULATORY_CARE_PROVIDER_SITE_OTHER): Payer: BLUE CROSS/BLUE SHIELD | Admitting: Internal Medicine

## 2015-04-14 VITALS — BP 139/70 | HR 80 | Temp 98.5°F | Resp 16 | Ht 70.0 in | Wt 222.6 lb

## 2015-04-14 DIAGNOSIS — M545 Low back pain, unspecified: Secondary | ICD-10-CM

## 2015-04-14 DIAGNOSIS — E785 Hyperlipidemia, unspecified: Secondary | ICD-10-CM

## 2015-04-14 DIAGNOSIS — Z79899 Other long term (current) drug therapy: Secondary | ICD-10-CM | POA: Diagnosis not present

## 2015-04-14 DIAGNOSIS — Z23 Encounter for immunization: Secondary | ICD-10-CM | POA: Diagnosis not present

## 2015-04-14 LAB — CBC WITH DIFFERENTIAL/PLATELET
BASOS ABS: 0.1 10*3/uL (ref 0.0–0.1)
Basophils Relative: 1 % (ref 0–1)
EOS ABS: 0.2 10*3/uL (ref 0.0–0.7)
Eosinophils Relative: 2 % (ref 0–5)
HCT: 50.8 % (ref 39.0–52.0)
Hemoglobin: 17.3 g/dL — ABNORMAL HIGH (ref 13.0–17.0)
Lymphocytes Relative: 25 % (ref 12–46)
Lymphs Abs: 2.1 10*3/uL (ref 0.7–4.0)
MCH: 30.4 pg (ref 26.0–34.0)
MCHC: 34.1 g/dL (ref 30.0–36.0)
MCV: 89.1 fL (ref 78.0–100.0)
MPV: 11.6 fL (ref 8.6–12.4)
Monocytes Absolute: 0.8 10*3/uL (ref 0.1–1.0)
Monocytes Relative: 10 % (ref 3–12)
NEUTROS PCT: 62 % (ref 43–77)
Neutro Abs: 5.2 10*3/uL (ref 1.7–7.7)
Platelets: 273 10*3/uL (ref 150–400)
RBC: 5.7 MIL/uL (ref 4.22–5.81)
RDW: 13 % (ref 11.5–15.5)
WBC: 8.4 10*3/uL (ref 4.0–10.5)

## 2015-04-14 MED ORDER — DIAZEPAM 5 MG PO TABS: 5.0000 mg | ORAL_TABLET | Freq: Every evening | ORAL | Status: AC | PRN

## 2015-04-14 MED ORDER — PREDNISONE 20 MG PO TABS: ORAL_TABLET | ORAL | Status: DC

## 2015-04-14 MED ORDER — MELOXICAM 15 MG PO TABS: 15.0000 mg | ORAL_TABLET | Freq: Every day | ORAL | Status: DC

## 2015-04-14 NOTE — Progress Notes (Signed)
Patient ID: Neil Henderson, male   DOB: Jul 16, 1975, 39 y.o.   MRN: [id number]  Assessment and Plan:  Hypertension:  -Continue medication,  -monitor blood pressure at home.  -Continue DASH diet.   -Reminder to go to the ER if any CP, SOB, nausea, dizziness, severe HA, changes vision/speech, left arm numbness and tingling, and jaw pain.  Cholesterol: -Continue diet and exercise.  -Check cholesterol.   Pre-diabetes: -Continue diet and exercise.  -Check A1C  Vitamin D Def: -check level -continue medications.   Back pain -prednisone -valium -mobic daily -heat and gentle stretching -ortho visit if no improvement  Continue diet and meds as discussed. Further disposition pending results of labs.  HPI 39 y.o. male  presents for 3 month follow up with hypertension, hyperlipidemia, prediabetes and vitamin D.   His blood pressure has been controlled at home, today their BP is BP: 139/70 mmHg.   He does not workout. He denies chest pain, shortness of breath, dizziness. He does walk around the restaurant all day.  He has not been taking his BP at home.     He is on cholesterol medication and denies myalgias. His cholesterol is at goal. The cholesterol last visit was:   Lab Results  Component Value Date   CHOL 183 12/29/2014   HDL 25* 12/29/2014   LDLCALC 94 12/29/2014   TRIG 320* 12/29/2014   CHOLHDL 7.3 12/29/2014     He has been working on diet and exercise for prediabetes, and denies foot ulcerations, hyperglycemia, hypoglycemia , increased appetite, nausea, paresthesia of the feet, polydipsia, polyuria, visual disturbances, vomiting and weight loss. Last A1C in the office was:  Lab Results  Component Value Date   HGBA1C 5.8* 12/29/2014    Patient is on Vitamin D supplement.  Lab Results  Component Value Date   VD25OH 54 12/29/2014     Patient reports that for the past couple months he has been having some trouble with his central low back that radiates to bilateral sides.   It hurts intermittently and is especially worse with standing for long periods of time.  He reports that he has tried flexeril and also tried advil.  He usually takes 600 mg of ibuprofen and once at 3 oclock in the afternoon.   Patient reports that he just started chantix again to help him quit smoking again.     Current Medications:  Current Outpatient Prescriptions on File Prior to Visit  Medication Sig Dispense Refill  . atorvastatin (LIPITOR) 80 MG tablet Take 1 tablet daily or as directed for cholesterol 30 tablet 11  . CHANTIX 1 MG tablet TAKE ONE TABLET BY MOUTH TWICE DAILY 60 tablet 4  . cholecalciferol (VITAMIN D) 1000 UNITS tablet Take 10,000 Units by mouth daily.    . cyclobenzaprine (FLEXERIL) 10 MG tablet TAKE 1 TABLET (10 MG TOTAL) BY MOUTH EVERY 8 (EIGHT) HOURS AS NEEDED FOR MUSCLE SPASMS. 90 tablet 2  . fenofibrate micronized (LOFIBRA) 134 MG capsule TAKE ONE CAPSULE BY MOUTH ONE TIME DAILY BEFORE BREAKFAST 90 capsule 1  . lisinopril (PRINIVIL,ZESTRIL) 40 MG tablet TAKE ONE TABLET BY MOUTH ONE TIME DAILY 90 tablet 1  . sildenafil (REVATIO) 20 MG tablet Take 1 to 5 tablets daily if needed for XXXX 30 tablet 99  . testosterone cypionate (DEPOTESTOSTERONE CYPIONATE) 200 MG/ML injection INJECT 2 CC IM EVERY 2 WEEKS 10 mL 1   No current facility-administered medications on file prior to visit.    Medical History:  Past Medical History  Diagnosis Date  . Hypertension   . Hyperlipidemia   . Prediabetes   . Hypogonadism male   . Vitamin D deficiency   . Obesity     Allergies: No Known Allergies   Review of Systems:  Review of Systems  Constitutional: Negative for fever, chills and malaise/fatigue.  HENT: Positive for congestion. Negative for ear pain and sore throat.   Eyes: Negative.   Respiratory: Negative for cough, shortness of breath and wheezing.   Cardiovascular: Negative for chest pain, palpitations and leg swelling.  Gastrointestinal: Negative for heartburn,  diarrhea, constipation, blood in stool and melena.  Genitourinary: Negative.   Musculoskeletal: Positive for back pain.  Neurological: Negative for dizziness, sensory change, loss of consciousness and headaches.  Psychiatric/Behavioral: Negative for depression. The patient is not nervous/anxious and does not have insomnia.     Family history- Review and unchanged  Social history- Review and unchanged  Physical Exam: BP 139/70 mmHg  Pulse 80  Temp(Src) 98.5 F (36.9 C)  Resp 16  Ht 5\' 10"  (1.778 m)  Wt 222 lb 9.6 oz (100.971 kg)  BMI 31.94 kg/m2 Wt Readings from Last 3 Encounters:  04/14/15 222 lb 9.6 oz (100.971 kg)  12/29/14 233 lb 12.8 oz (106.051 kg)  11/16/14 229 lb 3.2 oz (103.964 kg)    General Appearance: Well nourished well developed, in no apparent distress. Eyes: PERRLA, EOMs, conjunctiva no swelling or erythema ENT/Mouth: Ear canals normal without obstruction, swelling, erythma, discharge.  TMs normal bilaterally.  Oropharynx moist, clear, without exudate, or postoropharyngeal swelling. Neck: Supple, thyroid normal,no cervical adenopathy  Respiratory: Respiratory effort normal, Breath sounds clear A&P without rhonchi, wheeze, or rale.  No retractions, no accessory usage. Cardio: RRR with no MRGs. Brisk peripheral pulses without edema.  Abdomen: Soft, + BS,  Non tender, no guarding, rebound, hernias, masses. Musculoskeletal: Full ROM, 5/5 strength, Normal gait, Patient rises slowly from sitting to standing.  They walk without an antalgic gait.  There is no evidence of erythema, ecchymosis, or gross deformity.  There is tenderness to palpation over lumbar spine with no muscular tenderness to palpation.  Active ROM isfull with some pain with forward flexion and extension. Sensation to light touch is intact over all extremities.  Strength is symmetric and equal in all extremities.  Skin: Warm, dry without rashes, lesions, ecchymosis.  Neuro: Awake and oriented X 3, Cranial  nerves intact. Normal muscle tone, no cerebellar symptoms. Psych: Normal affect, Insight and Judgment appropriate.    Starlyn Skeans, PA-C 5:17 PM [facility]

## 2015-04-15 LAB — BASIC METABOLIC PANEL WITH GFR
BUN: 14 mg/dL (ref 7–25)
CO2: 28 mmol/L (ref 20–31)
Calcium: 10.2 mg/dL (ref 8.6–10.3)
Chloride: 103 mmol/L (ref 98–110)
Creat: 1.32 mg/dL (ref 0.60–1.35)
GFR, EST AFRICAN AMERICAN: 77 mL/min (ref >=60)
GFR, Est Non African American: 67 mL/min (ref >=60)
Glucose, Bld: 88 mg/dL (ref 65–99)
POTASSIUM: 4.4 mmol/L (ref 3.5–5.3)
SODIUM: 138 mmol/L (ref 135–146)

## 2015-04-15 LAB — LIPID PANEL
CHOL/HDL RATIO: 5.8 ratio — AB (ref <=5.0)
CHOLESTEROL: 152 mg/dL (ref 125–200)
HDL: 26 mg/dL — ABNORMAL LOW (ref >=40)
LDL Cholesterol: 75 mg/dL (ref <130)
TRIGLYCERIDES: 253 mg/dL — AB (ref <150)
VLDL: 51 mg/dL — AB (ref <30)

## 2015-04-15 LAB — HEPATIC FUNCTION PANEL
ALBUMIN: 4.4 g/dL (ref 3.6–5.1)
ALK PHOS: 47 U/L (ref 40–115)
ALT: 21 U/L (ref 9–46)
AST: 23 U/L (ref 10–40)
BILIRUBIN DIRECT: 0.1 mg/dL (ref <=0.2)
BILIRUBIN TOTAL: 0.6 mg/dL (ref 0.2–1.2)
Indirect Bilirubin: 0.5 mg/dL (ref 0.2–1.2)
Total Protein: 7 g/dL (ref 6.1–8.1)

## 2015-05-05 ENCOUNTER — Other Ambulatory Visit: Payer: Self-pay | Admitting: Internal Medicine

## 2015-05-05 ENCOUNTER — Encounter: Payer: Self-pay | Admitting: Physician Assistant

## 2015-05-05 ENCOUNTER — Other Ambulatory Visit: Payer: Self-pay | Admitting: Physician Assistant

## 2015-05-05 DIAGNOSIS — M545 Low back pain, unspecified: Secondary | ICD-10-CM

## 2015-05-05 MED ORDER — ATORVASTATIN CALCIUM 80 MG PO TABS: ORAL_TABLET | ORAL | Status: DC

## 2015-05-26 ENCOUNTER — Other Ambulatory Visit: Payer: Self-pay | Admitting: Physician Assistant

## 2015-05-26 DIAGNOSIS — E349 Endocrine disorder, unspecified: Secondary | ICD-10-CM

## 2015-06-10 ENCOUNTER — Other Ambulatory Visit: Payer: Self-pay | Admitting: Internal Medicine

## 2015-07-15 ENCOUNTER — Ambulatory Visit: Payer: Self-pay | Admitting: Internal Medicine

## 2015-08-01 NOTE — Patient Instructions (Signed)
9 Ways to Naturally Increase Testosterone Levels  1.   Lose Weight  If you're overweight, shedding the excess pounds may increase your testosterone levels, according to research presented at the Endocrine Society's 2012 meeting. Overweight men are more likely to have low testosterone levels to begin with, so this is an important trick to increase your body's testosterone production when you need it most.  2.   High-Intensity Exercise like Peak Fitness   Short intense exercise has a proven positive effect on increasing testosterone levels and preventing its decline. That's unlike aerobics or prolonged moderate exercise, which have shown to have negative or no effect on testosterone levels. Having a whey protein meal after exercise can further enhance the satiety/testosterone-boosting impact (hunger hormones cause the opposite effect on your testosterone and libido). Here's a summary of what a typical high-intensity Peak Fitness routine might look like: " Warm up for three minutes  " Exercise as hard and fast as you can for 30 seconds. You should feel like you couldn't possibly go on another few seconds  " Recover at a slow to moderate pace for 90 seconds  " Repeat the high intensity exercise and recovery 7 more times .  3.   Consume Plenty of Zinc  The mineral zinc is important for testosterone production, and supplementing your diet for as little as six weeks has been shown to cause a marked improvement in testosterone among men with low levels.1 Likewise, research has shown that restricting dietary sources of zinc leads to a significant decrease in testosterone, while zinc supplementation increases it2 -- and even protects men from exercised-induced reductions in testosterone levels.3 It's estimated that up to 63 percent of adults over the age of 60 may have lower than recommended zinc intakes; even when dietary supplements were added in, an estimated 20-25 percent of older adults still had  inadequate zinc intakes, according to a [employer] and Nutrition Examination Survey.4 Your diet is the best source of zinc; along with protein-rich foods like meats and fish, other good dietary sources of zinc include raw milk, raw cheese, beans, and yogurt or kefir made from raw milk. It can be difficult to obtain enough dietary zinc if you're a vegetarian, and also for meat-eaters as well, largely because of conventional farming methods that rely heavily on chemical fertilizers and pesticides. These chemicals deplete the soil of nutrients ... nutrients like zinc that must be absorbed by plants in order to be passed on to you. In many cases, you may further deplete the nutrients in your food by the way you prepare it. For most food, cooking it will drastically reduce its levels of nutrients like zinc . particularly over-cooking, which many people do. If you decide to use a zinc supplement, stick to a dosage of less than 40 mg a day, as this is the recommended adult upper limit. Taking too much zinc can interfere with your body's ability to absorb other minerals, especially copper, and may cause nausea as a side effect.  4.   Strength Training  In addition to [employer] Fitness, strength training is also known to boost testosterone levels, provided you are doing so intensely enough. When strength training to boost testosterone, you'll want to increase the weight and lower your number of reps, and then focus on exercises that work a large number of muscles, such as dead lifts or squats.  You can "turbo-charge" your weight training by going slower. By slowing down your movement, you're actually turning it into a  high-intensity exercise. Super Slow movement allows your muscle, at the microscopic level, to access the maximum number of cross-bridges between the protein filaments that produce movement in the muscle.   5.   Optimize Your Vitamin D Levels  Vitamin D, a steroid hormone, is essential for the  healthy development of the nucleus of the sperm cell, and helps maintain semen quality and sperm count. Vitamin D also increases levels of testosterone, which may boost libido. In one study, overweight men who were given vitamin D supplements had a significant increase in testosterone levels after one year.5   6.   Reduce Stress  When you're under a lot of stress, your body releases high levels of the stress hormone cortisol. This hormone actually blocks the effects of testosterone,6 presumably because, from a biological standpoint, testosterone-associated behaviors (mating, competing, aggression) may have lowered your chances of survival in an emergency (hence, the "fight or flight" response is dominant, courtesy of cortisol).  7.   Limit or Eliminate Sugar from Your Diet  Testosterone levels decrease after you eat sugar, which is likely because the sugar leads to a high insulin level, another factor leading to low testosterone.7 Based on USDA estimates, the average American consumes 12 teaspoons of sugar a day, which equates to about TWO TONS of sugar during a lifetime.  8.   Eat Healthy Fats  By healthy, this means not only mono- and polyunsaturated fats, like that found in avocadoes and nuts, but also saturated, as these are essential for building testosterone. Research shows that a diet with less than 40 percent of energy as fat (and that mainly from animal sources, i.e. saturated) lead to a decrease in testosterone levels. ie eat less animal products - as Meat , poultry and dairy. Experts agree that the ideal diet includes somewhere between 50-70 percent fat.  It's important to understand that your body requires saturated fats from animal and vegetable sources (such as meat, dairy, certain oils, and tropical plants like coconut) for optimal functioning, and if you neglect this important food group in favor of sugar, grains and other starchy carbs, your health and weight are almost guaranteed to  suffer. Examples of healthy fats you can eat more of to give your testosterone levels a boost include: Olives and Olive oil  Coconuts and coconut oil Butter made from raw grass-fed organic milk Raw nuts, such as, almonds or pecans Organic pastured egg yolks Avocados Grass-fed meats Palm oil Unheated organic nut oils   9.   Boost Your Intake of Branch Chain Amino Acids (BCAA) from Foods Like [place] suggests that BCAAs result in higher testosterone levels, particularly when taken along with resistance training. While BCAAs are available in supplement form, you'll find the highest concentrations of BCAAs like leucine in whey protein. Even when getting leucine from your natural food supply, it's often wasted or used as a building block instead of an anabolic agent. So to create the correct anabolic environment, you need to boost leucine consumption way beyond mere maintenance levels. That said, keep in mind that using leucine as a free form amino acid can be highly counterproductive as when free form amino acids are artificially administrated, they rapidly enter your circulation while disrupting insulin function, and impairing your body's glycemic control. Food-based leucine is really the ideal form that can benefit your muscles without side effects.  ++++++++++++++++++++++++++++++++++++++++++++++++++++++++++++++++++++++++  Recommend Adult Low Dose Aspirin or   coated  Aspirin 81 mg daily   To reduce risk of Colon  Cancer 20 %,   Skin Cancer 26 % ,   Melanoma 46%   and   Pancreatic cancer 60%   ++++++++++++++++++++++++++++++++++++++++++++++++++++++ Vitamin D goal   is between 70-100.   Please make sure that you are taking your Vitamin D as directed.   It is very important as a natural anti-inflammatory   helping hair, skin, and nails, as well as reducing stroke and heart attack risk.   It helps your bones and helps with mood.  It also decreases numerous cancer risks  so please take it as directed.   Low Vit D is associated with a 200-300% higher risk for CANCER   and 200-300% higher risk for HEART   ATTACK  &  STROKE.   .....................................Marland Kitchen  It is also associated with higher death rate at younger ages,   autoimmune diseases like Rheumatoid arthritis, Lupus, Multiple Sclerosis.     Also many other serious conditions, like depression, Alzheimer's  Dementia, infertility, muscle aches, fatigue, fibromyalgia - just to name a few.  ++++++++++++++++++++++++++++++++++++++++++++++++  Recommend the book "The END of DIETING" by Dr Excell Seltzer   & the book "The END of DIABETES " by Dr Excell Seltzer  At [email or web address] - get book & Audio CD's     Being diabetic has a  300% increased risk for heart attack, stroke, cancer, and alzheimer- type vascular dementia. It is very important that you work harder with diet by avoiding all foods that are white. Avoid white rice (brown & wild rice is OK), white potatoes (sweetpotatoes in moderation is OK), White bread or wheat bread or anything made out of white flour like bagels, donuts, rolls, buns, biscuits, cakes, pastries, cookies, pizza crust, and pasta (made from white flour & egg whites) - vegetarian pasta or spinach or wheat pasta is OK. Multigrain breads like Arnold's or [employer], or multigrain sandwich thins or flatbreads.  Diet, exercise and weight loss can reverse and cure diabetes in the early stages.  Diet, exercise and weight loss is very important in the control and prevention of complications of diabetes which affects every system in your body, ie. Brain - dementia/stroke, eyes - glaucoma/blindness, heart - heart attack/heart failure, kidneys - dialysis, stomach - gastric paralysis, intestines - malabsorption, nerves - severe painful neuritis, circulation - gangrene & loss of a leg(s), and finally cancer and Alzheimers.    I recommend avoid fried & greasy foods,  sweets/candy, white rice (brown  or wild rice or Quinoa is OK), white potatoes (sweet potatoes are OK) - anything made from white flour - bagels, doughnuts, rolls, buns, biscuits,white and wheat breads, pizza crust and traditional pasta made of white flour & egg white(vegetarian pasta or spinach or wheat pasta is OK).  Multi-grain bread is OK - like multi-grain flat bread or sandwich thins. Avoid alcohol in excess. Exercise is also important.    Eat all the vegetables you want - avoid meat, especially red meat and dairy - especially cheese.  Cheese is the most concentrated form of trans-fats which is the worst thing to clog up our arteries. Veggie cheese is OK which can be found in the fresh produce section at [employer] or [employer] or [employer]  ++++++++++++++++++++++++++++++++++++++++++++++++++ DASH Eating Plan  DASH stands for "Dietary Approaches to Stop Hypertension."   The DASH eating plan is a healthy eating plan that has been shown to reduce high blood pressure (hypertension). Additional health benefits may include reducing the risk of type 2 diabetes mellitus, heart disease, and stroke. The  DASH eating plan may also help with weight loss.  WHAT DO I NEED TO KNOW ABOUT THE DASH EATING PLAN?  For the DASH eating plan, you will follow these general guidelines:  Choose foods with a percent daily value for sodium of less than 5% (as listed on the food label).  Use salt-free seasonings or herbs instead of table salt or sea salt.  Check with your health care provider or pharmacist before using salt substitutes.  Eat lower-sodium products, often labeled as "lower sodium" or "no salt added."  Eat fresh foods.  Eat more vegetables, fruits, and low-fat dairy products.    Choose whole grains. Look for the word "whole" as the first word in the ingredient list.  Choose fish   Limit sweets, desserts, sugars, and sugary drinks.  Choose heart-healthy fats.  Eat veggie cheese   Eat more home-cooked food and less  restaurant, buffet, and fast food.  Limit fried foods.  Cook foods using methods other than frying.  Limit canned vegetables. If you do use them, rinse them well to decrease the sodium.  When eating at a restaurant, ask that your food be prepared with less salt, or no salt if possible.                      WHAT FOODS CAN I EAT?  Read Dr Fara Olden Fuhrman's books on The End of Dieting & The End of Diabetes  Grains  Whole grain or whole wheat bread. Brown rice. Whole grain or whole wheat pasta. Quinoa, bulgur, and whole grain cereals. Low-sodium cereals. Corn or whole wheat flour tortillas. Whole grain cornbread. Whole grain crackers. Low-sodium crackers.  Vegetables  Fresh or frozen vegetables (raw, steamed, roasted, or grilled). Low-sodium or reduced-sodium tomato and vegetable juices. Low-sodium or reduced-sodium tomato sauce and paste. Low-sodium or reduced-sodium canned vegetables.   Fruits  All fresh, canned (in natural juice), or frozen fruits.  Protein Products   All fish and seafood.  Dried beans, peas, or lentils. Unsalted nuts and seeds. Unsalted canned beans.  Dairy  Low-fat dairy products, such as skim or 1% milk, 2% or reduced-fat cheeses, low-fat ricotta or cottage cheese, or plain low-fat yogurt. Low-sodium or reduced-sodium cheeses.  Fats and Oils  Tub margarines without trans fats. Light or reduced-fat mayonnaise and salad dressings (reduced sodium). Avocado. Safflower, olive, or canola oils. Natural peanut or almond butter.  Other  Unsalted popcorn and pretzels. The items listed above may not be a complete list of recommended foods or beverages. Contact your dietitian for more options.  +++++++++++++++++++++++++++++++++++++++++++  WHAT FOODS ARE NOT RECOMMENDED?  Grains/ White flour or wheat flour  White bread. White pasta. White rice. Refined cornbread. Bagels and croissants. Crackers that contain trans fat.  Vegetables  Creamed or fried vegetables.  Vegetables in a . Regular canned vegetables. Regular canned tomato sauce and paste. Regular tomato and vegetable juices.  Fruits  Dried fruits. Canned fruit in light or heavy syrup. Fruit juice.  Meat and Other Protein Products  Meat in general - RED mwaet & White meat.  Fatty cuts of meat. Ribs, chicken wings, bacon, sausage, bologna, salami, chitterlings, fatback, hot dogs, bratwurst, and packaged luncheon meats.  Dairy  Whole or 2% milk, cream, half-and-half, and cream cheese. Whole-fat or sweetened yogurt. Full-fat cheeses or blue cheese. Nondairy creamers and whipped toppings. Processed cheese, cheese spreads, or cheese curds.  Condiments  Onion and garlic salt, seasoned salt, table salt, and sea salt. Canned and packaged gravies.  Worcestershire sauce. Tartar sauce. Barbecue sauce. Teriyaki sauce. Soy sauce, including reduced sodium. Steak sauce. Fish sauce. Oyster sauce. Cocktail sauce. Horseradish. Ketchup and mustard. Meat flavorings and tenderizers. Bouillon cubes. Hot sauce. Tabasco sauce. Marinades. Taco seasonings. Relishes.  Fats and Oils Butter, stick margarine, lard, shortening and bacon fat. Coconut, palm kernel, or palm oils. Regular salad dressings.  Pickles and olives. Salted popcorn and pretzels.  The items listed above may not be a complete list of foods and beverages to avoid.

## 2015-08-01 NOTE — Progress Notes (Signed)
Patient ID: Neil Henderson, male   DOB: 1975/07/21, 40 y.o.   MRN: [id number]   This very nice 40 y.o. MWM presents for 6 month follow up with Hypertension, Hyperlipidemia, Pre-Diabetes and Vitamin D Deficiency.    Patient is treated for HTN circa 2007 & BP has been controlled at home. Today's BP: 132/70 mmHg. Patient has had no complaints of any cardiac type chest pain, palpitations, dyspnea/orthopnea/PND, dizziness, claudication, or dependent edema.   Hyperlipidemia is controlled with diet & meds. Patient denies myalgias or other med SE's. Last Lipids were at goal with Cholesterol 152; HDL 26*; LDL 75; but very elevated Triglycerides 253 on 04/14/2015.   Also, the patient has history of Morbid Obesity (BMI 32.57) and consequent PreDiabetes with A1c 5.7% circa 08/2011 and then normalized to 5.4% in 09/2014 with better diet & weight loss. He denies symptoms of reactive hypoglycemia, diabetic polys, paresthesias or visual blurring.  Last A1c was back up again at 5.8% on 12/29/2014.    Other problems include Low Testosterone at 218 in 2012 ans 201 in 2014 and p[atient has been on replacement parenteral therapy with improved mood, stamina  and sense of well-being. Further, the patient also has history of Vitamin D Deficiency of "13" in 2008 and supplements vitamin D without any suspected side-effects. Last vitamin D was 54 on 12/29/2014.  Medication Sig  . atorvastatin  80 MG Take 1 tablet daily or as directed for cholesterol  . CHANTIX 1 MG  TAKE ONE TABLET BY MOUTH TWICE DAILY  . VITAMIN D 1000 UNITS  Take 10,000 Units by mouth daily.  . diazepam 5 MG Take 1 tablet (5 mg total) by mouth at bedtime as needed for anxiety.  . fenofibrate  134 MG  TAKE ONE CAPSULE BY MOUTH ONE TIME DAILY BEFORE BREAKFAST  . lisinopril 40 MG TAKE ONE TABLET BY MOUTH ONE TIME DAILY  . meloxicam (MOBIC) 15 MG Take 1 tablet (15 mg total) by mouth daily.  . sildenafil (REVATIO) 20 MG  Take 1 to 5 tablets daily if needed for XXXX   . testosterone cypionate  200 MG/ML inj INJECT 2CC'S IM EVERY 2 WEEKS   No Known Allergies  PMHx:   Past Medical History  Diagnosis Date  . Hypertension   . Hyperlipidemia   . Prediabetes   . Hypogonadism male   . Vitamin D deficiency   . Obesity    Immunization History  Administered Date(s) Administered  . Influenza Split 03/31/2014, 04/14/2015  . PPD Test 12/24/2013, 12/29/2014  . Pneumococcal-Unspecified 12/05/2011  . Tdap 12/05/2011   FHx:    Reviewed / unchanged  SHx:    Reviewed / unchanged  Systems Review:  Constitutional: Denies fever, chills, wt changes, headaches, insomnia, fatigue, night sweats, change in appetite. Eyes: Denies redness, blurred vision, diplopia, discharge, itchy, watery eyes.  ENT: Denies discharge, congestion, post nasal drip, epistaxis, sore throat, earache, hearing loss, dental pain, tinnitus, vertigo, sinus pain, snoring.  CV: Denies chest pain, palpitations, irregular heartbeat, syncope, dyspnea, diaphoresis, orthopnea, PND, claudication or edema. Respiratory: denies cough, dyspnea, DOE, pleurisy, hoarseness, laryngitis, wheezing.  Gastrointestinal: Denies dysphagia, odynophagia, heartburn, reflux, water brash, abdominal pain or cramps, nausea, vomiting, bloating, diarrhea, constipation, hematemesis, melena, hematochezia  or hemorrhoids. Genitourinary: Denies dysuria, frequency, urgency, nocturia, hesitancy, discharge, hematuria or flank pain. Musculoskeletal: Denies arthralgias, myalgias, stiffness, jt. swelling, pain, limping or strain/sprain.  Skin: Denies pruritus, rash, hives, warts, acne, eczema or change in skin lesion(s). Neuro: No weakness, tremor, incoordination, spasms, paresthesia or  pain. Psychiatric: Denies confusion, memory loss or sensory loss. Endo: Denies change in weight, skin or hair change.  Heme/Lymph: No excessive bleeding, bruising or enlarged lymph nodes.  Physical Exam  BP 132/70 mmHg  Pulse 114  Temp(Src) 97.5  F (36.4 C) (Temporal)  Resp 16  Ht 5\' 10"  (1.778 m)  Wt 227 lb (102.967 kg)  BMI 32.57 kg/m2  SpO2 98%  Appears over nourished and in no distress. Eyes: PERRLA, EOMs, conjunctiva no swelling or erythema. Sinuses: No frontal/maxillary tenderness ENT/Mouth: EAC's clear, TM's nl w/o erythema, bulging. Nares clear w/o erythema, swelling, exudates. Oropharynx clear without erythema or exudates. Oral hygiene is good. Tongue normal, non obstructing. Hearing intact.  Neck: Supple. Thyroid nl. Car 2+/2+ without bruits, nodes or JVD. Chest: Respirations nl with BS clear & equal w/o rales, rhonchi, wheezing or stridor.  Cor: Heart sounds normal w/ regular rate and rhythm without sig. murmurs, gallops, clicks, or rubs. Peripheral pulses normal and equal  without edema.  Abdomen: Soft & bowel sounds normal. Non-tender w/o guarding, rebound, hernias, masses, or organomegaly.  Lymphatics: Unremarkable.  Musculoskeletal: Full ROM all peripheral extremities, joint stability, 5/5 strength, and normal gait.  Skin: Warm, dry without exposed rashes, lesions or ecchymosis apparent.  Neuro: Cranial nerves intact, reflexes equal bilaterally. Sensory-motor testing grossly intact. Tendon reflexes grossly intact.  Pysch: Alert & oriented x 3.  Insight and judgement nl & appropriate. No ideations.  Assessment and Plan:  1. Essential hypertension  - TSH  2. Hyperlipidemia  - Lipid panel - TSH  3. Prediabetes  - Hemoglobin A1c - Insulin, random  4. Vitamin D deficiency  - VITAMIN D 25 Hydroxy   5. Testosterone Deficiency  - Testosterone  6. Morbid obesity ([place])   7. Medication management  - CBC with Differential/Platelet - BASIC METABOLIC PANEL WITH GFR - Hepatic function panel - Magnesium   Recommended regular exercise, BP monitoring, weight control, and discussed med and SE's. Recommended labs to assess and monitor clinical status. Further disposition pending results of labs. Over 30  minutes of exam, counseling, chart review was performed

## 2015-08-02 ENCOUNTER — Ambulatory Visit (INDEPENDENT_AMBULATORY_CARE_PROVIDER_SITE_OTHER): Payer: BLUE CROSS/BLUE SHIELD | Admitting: Internal Medicine

## 2015-08-02 ENCOUNTER — Encounter: Payer: Self-pay | Admitting: Internal Medicine

## 2015-08-02 VITALS — BP 132/70 | HR 114 | Temp 97.5°F | Resp 16 | Ht 70.0 in | Wt 227.0 lb

## 2015-08-02 DIAGNOSIS — I10 Essential (primary) hypertension: Secondary | ICD-10-CM

## 2015-08-02 DIAGNOSIS — R7303 Prediabetes: Secondary | ICD-10-CM | POA: Diagnosis not present

## 2015-08-02 DIAGNOSIS — E291 Testicular hypofunction: Secondary | ICD-10-CM

## 2015-08-02 DIAGNOSIS — Z79899 Other long term (current) drug therapy: Secondary | ICD-10-CM

## 2015-08-02 DIAGNOSIS — E782 Mixed hyperlipidemia: Secondary | ICD-10-CM

## 2015-08-02 DIAGNOSIS — E559 Vitamin D deficiency, unspecified: Secondary | ICD-10-CM

## 2015-08-02 LAB — CBC WITH DIFFERENTIAL/PLATELET
Basophils Absolute: 0 10*3/uL (ref 0.0–0.1)
Basophils Relative: 0 % (ref 0–1)
EOS ABS: 0.1 10*3/uL (ref 0.0–0.7)
EOS PCT: 1 % (ref 0–5)
HEMATOCRIT: 45.9 % (ref 39.0–52.0)
Hemoglobin: 15.2 g/dL (ref 13.0–17.0)
LYMPHS PCT: 16 % (ref 12–46)
Lymphs Abs: 1.6 10*3/uL (ref 0.7–4.0)
MCH: 30.2 pg (ref 26.0–34.0)
MCHC: 33.1 g/dL (ref 30.0–36.0)
MCV: 91.1 fL (ref 78.0–100.0)
MONO ABS: 1.2 10*3/uL — AB (ref 0.1–1.0)
MPV: 11.1 fL (ref 8.6–12.4)
Monocytes Relative: 12 % (ref 3–12)
Neutro Abs: 7.2 10*3/uL (ref 1.7–7.7)
Neutrophils Relative %: 71 % (ref 43–77)
PLATELETS: 328 10*3/uL (ref 150–400)
RBC: 5.04 MIL/uL (ref 4.22–5.81)
RDW: 14.6 % (ref 11.5–15.5)
WBC: 10.2 10*3/uL (ref 4.0–10.5)

## 2015-08-02 LAB — HEPATIC FUNCTION PANEL
ALK PHOS: 66 U/L (ref 40–115)
ALT: 35 U/L (ref 9–46)
AST: 29 U/L (ref 10–40)
Albumin: 4.1 g/dL (ref 3.6–5.1)
BILIRUBIN INDIRECT: 0.6 mg/dL (ref 0.2–1.2)
BILIRUBIN TOTAL: 0.7 mg/dL (ref 0.2–1.2)
Bilirubin, Direct: 0.1 mg/dL (ref <=0.2)
TOTAL PROTEIN: 6.6 g/dL (ref 6.1–8.1)

## 2015-08-02 LAB — BASIC METABOLIC PANEL WITH GFR
BUN: 14 mg/dL (ref 7–25)
CALCIUM: 9.3 mg/dL (ref 8.6–10.3)
CO2: 24 mmol/L (ref 20–31)
Chloride: 102 mmol/L (ref 98–110)
Creat: 0.89 mg/dL (ref 0.60–1.35)
GLUCOSE: 90 mg/dL (ref 65–99)
Potassium: 4.1 mmol/L (ref 3.5–5.3)
Sodium: 137 mmol/L (ref 135–146)

## 2015-08-02 LAB — TSH: TSH: 1.25 mIU/L (ref 0.40–4.50)

## 2015-08-02 LAB — HEMOGLOBIN A1C
Hgb A1c MFr Bld: 5.6 % (ref <5.7)
MEAN PLASMA GLUCOSE: 114 mg/dL (ref <117)

## 2015-08-02 LAB — LIPID PANEL
CHOLESTEROL: 169 mg/dL (ref 125–200)
HDL: 29 mg/dL — AB (ref >=40)
LDL CALC: 66 mg/dL (ref <130)
TRIGLYCERIDES: 369 mg/dL — AB (ref <150)
Total CHOL/HDL Ratio: 5.8 Ratio — ABNORMAL HIGH (ref <=5.0)
VLDL: 74 mg/dL — AB (ref <30)

## 2015-08-02 LAB — MAGNESIUM: Magnesium: 1.7 mg/dL (ref 1.5–2.5)

## 2015-08-03 LAB — INSULIN, RANDOM: INSULIN: 54.4 u[IU]/mL — AB (ref 2.0–19.6)

## 2015-08-03 LAB — VITAMIN D 25 HYDROXY (VIT D DEFICIENCY, FRACTURES): Vit D, 25-Hydroxy: 59 ng/mL (ref 30–100)

## 2015-08-03 LAB — TESTOSTERONE: Testosterone: 774 ng/dL (ref 250–827)

## 2015-10-31 ENCOUNTER — Other Ambulatory Visit: Payer: Self-pay | Admitting: Internal Medicine

## 2016-01-24 ENCOUNTER — Ambulatory Visit (INDEPENDENT_AMBULATORY_CARE_PROVIDER_SITE_OTHER): Payer: BLUE CROSS/BLUE SHIELD | Admitting: Internal Medicine

## 2016-01-24 ENCOUNTER — Encounter: Payer: Self-pay | Admitting: Internal Medicine

## 2016-01-24 VITALS — BP 158/74 | HR 76 | Temp 97.5°F | Resp 16 | Ht 69.0 in | Wt 222.6 lb

## 2016-01-24 DIAGNOSIS — Z136 Encounter for screening for cardiovascular disorders: Secondary | ICD-10-CM | POA: Diagnosis not present

## 2016-01-24 DIAGNOSIS — Z111 Encounter for screening for respiratory tuberculosis: Secondary | ICD-10-CM | POA: Diagnosis not present

## 2016-01-24 DIAGNOSIS — I10 Essential (primary) hypertension: Secondary | ICD-10-CM | POA: Diagnosis not present

## 2016-01-24 DIAGNOSIS — E782 Mixed hyperlipidemia: Secondary | ICD-10-CM

## 2016-01-24 DIAGNOSIS — Z79899 Other long term (current) drug therapy: Secondary | ICD-10-CM

## 2016-01-24 DIAGNOSIS — Z125 Encounter for screening for malignant neoplasm of prostate: Secondary | ICD-10-CM

## 2016-01-24 DIAGNOSIS — E559 Vitamin D deficiency, unspecified: Secondary | ICD-10-CM | POA: Diagnosis not present

## 2016-01-24 DIAGNOSIS — R7303 Prediabetes: Secondary | ICD-10-CM

## 2016-01-24 DIAGNOSIS — Z0000 Encounter for general adult medical examination without abnormal findings: Secondary | ICD-10-CM

## 2016-01-24 DIAGNOSIS — Z1212 Encounter for screening for malignant neoplasm of rectum: Secondary | ICD-10-CM

## 2016-01-24 DIAGNOSIS — E291 Testicular hypofunction: Secondary | ICD-10-CM

## 2016-01-24 DIAGNOSIS — Z0001 Encounter for general adult medical examination with abnormal findings: Secondary | ICD-10-CM

## 2016-01-24 DIAGNOSIS — R5383 Other fatigue: Secondary | ICD-10-CM

## 2016-01-24 LAB — CBC WITH DIFFERENTIAL/PLATELET
Basophils Absolute: 0 cells/uL (ref 0–200)
Basophils Relative: 0 %
Eosinophils Absolute: 158 cells/uL (ref 15–500)
Eosinophils Relative: 2 %
HCT: 48.6 % (ref 38.5–50.0)
Hemoglobin: 16.7 g/dL (ref 13.2–17.1)
Lymphocytes Relative: 17 %
Lymphs Abs: 1343 cells/uL (ref 850–3900)
MCH: 31.3 pg (ref 27.0–33.0)
MCHC: 34.4 g/dL (ref 32.0–36.0)
MCV: 91.2 fL (ref 80.0–100.0)
MPV: 12.4 fL (ref 7.5–12.5)
Monocytes Absolute: 632 cells/uL (ref 200–950)
Monocytes Relative: 8 %
Neutro Abs: 5767 cells/uL (ref 1500–7800)
Neutrophils Relative %: 73 %
Platelets: 268 10*3/uL (ref 140–400)
RBC: 5.33 MIL/uL (ref 4.20–5.80)
RDW: 13.9 % (ref 11.0–15.0)
WBC: 7.9 10*3/uL (ref 3.8–10.8)

## 2016-01-24 LAB — HEPATIC FUNCTION PANEL
ALT: 30 U/L (ref 9–46)
AST: 20 U/L (ref 10–40)
Albumin: 4.6 g/dL (ref 3.6–5.1)
Alkaline Phosphatase: 74 U/L (ref 40–115)
Bilirubin, Direct: 0.1 mg/dL
Indirect Bilirubin: 0.6 mg/dL (ref 0.2–1.2)
Total Bilirubin: 0.7 mg/dL (ref 0.2–1.2)
Total Protein: 6.6 g/dL (ref 6.1–8.1)

## 2016-01-24 LAB — HEMOGLOBIN A1C
Hgb A1c MFr Bld: 5.5 % (ref <5.7)
Mean Plasma Glucose: 111 mg/dL

## 2016-01-24 LAB — BASIC METABOLIC PANEL WITH GFR
BUN: 14 mg/dL (ref 7–25)
CHLORIDE: 103 mmol/L (ref 98–110)
CO2: 26 mmol/L (ref 20–31)
Calcium: 9.5 mg/dL (ref 8.6–10.3)
Creat: 0.99 mg/dL (ref 0.60–1.35)
GFR, Est African American: >89 mL/min (ref >=60)
GFR, Est Non African American: >89 mL/min (ref >=60)
GLUCOSE: 117 mg/dL — AB (ref 65–99)
Potassium: 4.2 mmol/L (ref 3.5–5.3)
Sodium: 137 mmol/L (ref 135–146)

## 2016-01-24 LAB — LIPID PANEL
Cholesterol: 182 mg/dL (ref 125–200)
HDL: 28 mg/dL — ABNORMAL LOW
Total CHOL/HDL Ratio: 6.5 ratio — ABNORMAL HIGH
Triglycerides: 598 mg/dL — ABNORMAL HIGH

## 2016-01-24 LAB — VITAMIN B12: VITAMIN B 12: 573 pg/mL (ref 200–1100)

## 2016-01-24 LAB — MAGNESIUM: Magnesium: 1.9 mg/dL (ref 1.5–2.5)

## 2016-01-24 LAB — TSH: TSH: 1.3 m[IU]/L (ref 0.40–4.50)

## 2016-01-24 NOTE — Progress Notes (Signed)
[facility]   Unk Pinto, M.D.    [facility]. [facility], P.A.-C      [facility], P.A.-C   [facility]                [address] [place] [phone] Telephone [phone] Telefax [phone]  Annual  Screening/Preventative Visit And Comprehensive Evaluation & Examination     This very nice 40 y.o.male presents for a Wellness/Preventative Visit & comprehensive evaluation and management of multiple medical co-morbidities.  Patient has been followed for HTN, T2_NIDDM  Prediabetes, Hyperlipidemia and Vitamin D Deficiency.     HTN predates circa 2007. Patient's BP has been controlled at home.Today's BP: (!) 158/74as he had failed to take his BP meds this am. Patient denies any cardiac symptoms as chest pain, palpitations, shortness of breath, dizziness or ankle swelling.     Patient's hyperlipidemia is controlled with diet and medications. Patient denies myalgias or other medication SE's. Last lipids were at goal with an elevated Triglycerides: Lab Results  Component Value Date   CHOL 169 08/02/2015   HDL 29 (L) 08/02/2015   LDLCALC 66 08/02/2015   TRIG 369 (H) 08/02/2015   CHOLHDL 5.8 (H) 08/02/2015      Patient has hx/o Low Testosterone  Of 218 in 2012 and 201 in 2014 and is on Depo-Testosterone injections with improved sense iof well-being. Patient has prediabetes circa 08/2013 with A1c 5.7% and patient denies reactive hypoglycemic symptoms, visual blurring, diabetic polys or paresthesias. Last A1c was normal: Lab Results  Component Value Date   HGBA1C 5.6 08/02/2015       Finally, patient has history of Vitamin D Deficiency of "13" in 2008 and last vitamin D was  Lab Results  Component Value Date   VD25OH 69 08/02/2015   Current Outpatient Prescriptions on File Prior to Visit  Medication Sig  . atorvastatin (LIPITOR) 80 MG tablet Take 1 tablet daily or as directed for cholesterol   . cyclobenzaprine (FLEXERIL) 10 MG tablet TAKE 1 TABLET (10 MG TOTAL) BY MOUTH EVERY 8 (EIGHT) HOURS AS NEEDED FOR MUSCLE SPASMS.  . diazepam (VALIUM) 5 MG tablet Take 1 tablet (5 mg total) by mouth at bedtime as needed for anxiety.  Marland Kitchen lisinopril (PRINIVIL,ZESTRIL) 40 MG tablet TAKE ONE TABLET BY MOUTH ONE TIME DAILY  . testosterone cypionate (DEPOTESTOSTERONE CYPIONATE) 200 MG/ML injection INJECT 2CC'S IM EVERY 2 WEEKS  . CHANTIX 1 MG tablet TAKE ONE TABLET BY MOUTH TWICE DAILY (Patient not taking: Reported on 01/24/2016)   No current facility-administered medications on file prior to visit.    No Known Allergies Past Medical History:  Diagnosis Date  . Hyperlipidemia   . Hypertension   . Hypogonadism male   . Obesity   . Prediabetes   . Vitamin D deficiency    Health Maintenance  Topic Date Due  . INFLUENZA VACCINE  01/25/2016  . TETANUS/TDAP  12/04/2021  . HIV Screening  Completed   Immunization History  Administered Date(s) Administered  . Influenza Split 03/31/2014, 04/14/2015  . PPD Test 12/24/2013, 12/29/2014  . Pneumococcal-Unspecified 12/05/2011  . Tdap 12/05/2011   No past surgical history on file. Family History  Problem Relation Age of Onset  . Hyperlipidemia Mother   . Hyperlipidemia Father   . Cancer Paternal Grandfather     Lung   Social History   Social History  .  Marital status: Married    Spouse name: N/A  . Number of children: N/A  . Years of education: N/A   Occupational History  . Not on file.   Social History Main Topics  . Smoking status: Current Every Day Smoker    Types: Cigarettes    Last attempt to quit: 06/21/2015  . Smokeless tobacco: Former Systems developer    Quit date: 11/25/2014  . Alcohol use 2.4 oz/week    4 Standard drinks or equivalent per week     Comment: moderate  . Drug use:     Frequency: 2.0 times per week    Types: Marijuana  . Sexual activity: Not on file   Other Topics Concern  . Not on file   Social History Narrative   . No narrative on file    ROS Constitutional: Denies fever, chills, weight loss/gain, headaches, insomnia,  night sweats or change in appetite. Does c/o fatigue. Eyes: Denies redness, blurred vision, diplopia, discharge, itchy or watery eyes.  ENT: Denies discharge, congestion, post nasal drip, epistaxis, sore throat, earache, hearing loss, dental pain, Tinnitus, Vertigo, Sinus pain or snoring.  Cardio: Denies chest pain, palpitations, irregular heartbeat, syncope, dyspnea, diaphoresis, orthopnea, PND, claudication or edema Respiratory: denies cough, dyspnea, DOE, pleurisy, hoarseness, laryngitis or wheezing.  Gastrointestinal: Denies dysphagia, heartburn, reflux, water brash, pain, cramps, nausea, vomiting, bloating, diarrhea, constipation, hematemesis, melena, hematochezia, jaundice or hemorrhoids Genitourinary: Denies dysuria, frequency, urgency, nocturia, hesitancy, discharge, hematuria or flank pain Musculoskeletal: Denies arthralgia, myalgia, stiffness, Jt. Swelling, pain, limp or strain/sprain. Denies Falls. Skin: Denies puritis, rash, hives, warts, acne, eczema or change in skin lesion Neuro: No weakness, tremor, incoordination, spasms, paresthesia or pain Psychiatric: Denies confusion, memory loss or sensory loss. Denies Depression. Endocrine: Denies change in weight, skin, hair change, nocturia, and paresthesia, diabetic polys, visual blurring or hyper / hypo glycemic episodes.  Heme/Lymph: No excessive bleeding, bruising or enlarged lymph nodes.  Physical Exam  BP (!) 158/74 (omitted BP meds this am)  Pulse 76   Temp 97.5 F (36.4 C)   Resp 16   Ht 5\' 9"  (1.753 m)   Wt 222 lb 9.6 oz (101 kg)   BMI 32.87 kg/m   General Appearance: Well nourished, in no apparent distress. Eyes: PERRLA, EOMs, conjunctiva no swelling or erythema, normal fundi and vessels. Sinuses: No frontal/maxillary tenderness ENT/Mouth: EACs patent / TMs  nl. Nares clear without erythema, swelling, mucoid  exudates. Oral hygiene is good. No erythema, swelling, or exudate. Tongue normal, non-obstructing. Tonsils not swollen or erythematous. Hearing normal.  Neck: Supple, thyroid normal. No bruits, nodes or JVD. Respiratory: Respiratory effort normal.  BS equal and clear bilateral without rales, rhonci, wheezing or stridor. Cardio: Heart sounds are normal with regular rate and rhythm and no murmurs, rubs or gallops. Peripheral pulses are normal and equal bilaterally without edema. No aortic or femoral bruits. Chest: symmetric with normal excursions and percussion.  Abdomen: Soft, with Nl bowel sounds. Nontender, no guarding, rebound, hernias, masses, or organomegaly.  Lymphatics: Non tender without lymphadenopathy.  Genitourinary: No hernias.Testes nl. DRE - prostate nl for age - smooth & firm w/o nodules. Musculoskeletal: Full ROM all peripheral extremities, joint stability, 5/5 strength, and normal gait. Skin: Warm and dry without rashes, lesions, cyanosis, clubbing or  ecchymosis.  Neuro: Cranial nerves intact, reflexes equal bilaterally. Normal muscle tone, no cerebellar symptoms. Sensation intact.  Pysch: Alert and oriented X 3 with normal affect, insight and judgment appropriate.   Assessment and Plan  1. Annual Preventative/Screening  Exam   - Microalbumin / creatinine urine ratio - POC Hemoccult Bld/Stl  - Vitamin B12 - PSA - Testosterone - CBC with Differential/Platelet - B - Hepatic function panel - Magnesium - Lipid panel - TSH - Hemoglobin A1c - Insulin, random - VITAMIN D 25 Hydroxy  - EKG 12-Lead  2. Essential hypertension  - Microalbumin / creatinine urine ratio - TSH - EKG 12-Lead  3. Hyperlipidemia  - Lipid panel - TSH - EKG 12-Lead  4. Prediabetes  - Hemoglobin A1c - Insulin, random  5. Vitamin D deficiency  - VITAMIN D 25 Hydroxy   6. Testosterone Deficiency  - Testosterone  7. Morbid obesity due to excess calories ([place])   8. Encounter for  screening for malignant neoplasm of rectum  - POC Hemoccult Bld/Stl (3-Cd Home Screen); Future  9. Prostate cancer screening  - PSA  10. Screening for ischemic heart disease  - EKG 12-Lead  11. Other fatigue  - Vitamin B12 - Testosterone - CBC with Differential/Platelet - TSH  12. Medication management - CBC with Differential/Platelet - BASIC METABOLIC PANEL WITH GFR - Hepatic function panel - Magnesium   Continue prudent diet as discussed, weight control, BP monitoring, regular exercise, and medications as discussed.  Discussed med effects and SE's. Routine screening labs and tests as requested with regular follow-up as recommended. Over 40 minutes of exam, counseling, chart review and high complex critical decision making was performed

## 2016-01-24 NOTE — Patient Instructions (Addendum)

## 2016-01-25 LAB — VITAMIN D 25 HYDROXY (VIT D DEFICIENCY, FRACTURES): Vit D, 25-Hydroxy: 68 ng/mL (ref 30–100)

## 2016-01-25 LAB — MICROALBUMIN / CREATININE URINE RATIO
Creatinine, Urine: 193 mg/dL (ref 20–370)
MICROALB/CREAT RATIO: 3 ug/mg{creat} (ref <30)
Microalb, Ur: 0.5 mg/dL

## 2016-01-25 LAB — INSULIN, RANDOM: INSULIN: 164 u[IU]/mL — AB (ref 2.0–19.6)

## 2016-01-25 LAB — PSA: PSA: 1.51 ng/mL (ref <=4.00)

## 2016-01-25 LAB — TESTOSTERONE: Testosterone: 187 ng/dL — ABNORMAL LOW (ref 250–827)

## 2016-01-25 NOTE — Addendum Note (Signed)
Addended by: Melbourne Abts C on: 01/25/2016 10:17 AM   Modules accepted: Orders

## 2016-03-14 ENCOUNTER — Other Ambulatory Visit: Payer: Self-pay | Admitting: Internal Medicine

## 2016-03-14 DIAGNOSIS — E349 Endocrine disorder, unspecified: Secondary | ICD-10-CM

## 2016-04-23 ENCOUNTER — Other Ambulatory Visit: Payer: Self-pay | Admitting: Internal Medicine

## 2016-05-05 ENCOUNTER — Ambulatory Visit: Payer: Self-pay | Admitting: Internal Medicine

## 2016-05-27 ENCOUNTER — Other Ambulatory Visit: Payer: Self-pay | Admitting: Physician Assistant

## 2016-06-04 ENCOUNTER — Emergency Department (HOSPITAL_COMMUNITY): Payer: Self-pay

## 2016-06-04 ENCOUNTER — Encounter (HOSPITAL_COMMUNITY): Payer: Self-pay

## 2016-06-04 ENCOUNTER — Emergency Department (HOSPITAL_COMMUNITY)
Admission: EM | Admit: 2016-06-04 | Discharge: 2016-06-04 | Disposition: A | Discharge status: Home / Self Care | Payer: Self-pay | Attending: Emergency Medicine | Admitting: Emergency Medicine

## 2016-06-04 DIAGNOSIS — Y939 Activity, unspecified: Secondary | ICD-10-CM | POA: Insufficient documentation

## 2016-06-04 DIAGNOSIS — S8991XA Unspecified injury of right lower leg, initial encounter: Secondary | ICD-10-CM | POA: Insufficient documentation

## 2016-06-04 DIAGNOSIS — Y929 Unspecified place or not applicable: Secondary | ICD-10-CM | POA: Insufficient documentation

## 2016-06-04 DIAGNOSIS — Y999 Unspecified external cause status: Secondary | ICD-10-CM | POA: Insufficient documentation

## 2016-06-04 DIAGNOSIS — F17210 Nicotine dependence, cigarettes, uncomplicated: Secondary | ICD-10-CM | POA: Insufficient documentation

## 2016-06-04 DIAGNOSIS — X501XXA Overexertion from prolonged static or awkward postures, initial encounter: Secondary | ICD-10-CM | POA: Insufficient documentation

## 2016-06-04 DIAGNOSIS — I10 Essential (primary) hypertension: Secondary | ICD-10-CM | POA: Insufficient documentation

## 2016-06-04 DIAGNOSIS — Z79899 Other long term (current) drug therapy: Secondary | ICD-10-CM | POA: Insufficient documentation

## 2016-06-04 MED ORDER — HYDROCODONE-ACETAMINOPHEN 5-325 MG PO TABS: 1.0000 | ORAL_TABLET | ORAL | 0 refills | Status: DC | PRN

## 2016-06-04 MED ORDER — IBUPROFEN 800 MG PO TABS
800.0000 mg | ORAL_TABLET | Freq: Once | ORAL | Status: AC
  Administered 2016-06-04: 800 mg via ORAL
  Filled 2016-06-04: qty 1

## 2016-06-04 MED ORDER — IBUPROFEN 800 MG PO TABS: 800.0000 mg | ORAL_TABLET | Freq: Three times a day (TID) | ORAL | 0 refills | Status: DC | PRN

## 2016-06-04 NOTE — ED Notes (Signed)
PT DISCHARGED. INSTRUCTIONS AND PRESCRIPTIONS GIVEN. AAOX4. PT IN NO APPARENT DISTRESS. THE OPPORTUNITY TO ASK QUESTIONS WAS PROVIDED. 

## 2016-06-04 NOTE — ED Triage Notes (Signed)
Pt c/o R knee pain x 2 days.  Pain score 8/10.  Pt reports "I slipped on some slush and it kinda gave out."  Pt reports that he is unable to completely extend leg.  Pt was ambulatory to room.

## 2016-06-04 NOTE — ED Notes (Signed)
Bed: WTR6 Expected date:  Expected time:  Means of arrival:  Comments: 

## 2016-06-04 NOTE — ED Provider Notes (Signed)
[facility] DEPT Provider Note    By signing my name below, I, Neil Henderson, attest that this documentation has been prepared under the direction and in the presence of [facility], PA-C. Electronically Signed: Bea [facility], ED Scribe. 06/04/16. 11:32 AM.   History   Chief Complaint Chief Complaint  Patient presents with  . Knee Pain    The history is provided by the patient and medical records. No language interpreter was used.    HPI Comments:  Neil Henderson is a 40 y.o. male who presents to the Emergency Department complaining of right anterior knee pain that began two days ago. Pt reports associated swelling of the knee. He reports slipping in some snow without falling, but twisting the knee. He states the knee gave out on him which has happened in the past. He reports working He has not taken anything for pain. Bearing weight and extension of the RLE increases the pain. He denies alleviating factors. He denies numbness, tingling or weakness of the right foot or RLE, bruising, wounds, fever, chills, calf swelling.   Past Medical History:  Diagnosis Date  . Hyperlipidemia   . Hypertension   . Hypogonadism male   . Obesity   . Prediabetes   . Vitamin D deficiency     Patient Active Problem List   Diagnosis Date Noted  . Encounter for general adult medical examination with abnormal findings 01/24/2016  . Medication management 12/24/2013  . Essential hypertension 07/02/2013  . Hyperlipidemia 07/02/2013  . Vitamin D deficiency 07/02/2013  . Prediabetes   . Testosterone Deficiency   . Morbid obesity (BMI 33.55)      Past Surgical History:  Procedure Laterality Date  . CERVICAL FUSION       Home Medications    Prior to Admission medications   Medication Sig Start Date End Date Taking? Authorizing Provider  atorvastatin (LIPITOR) 80 MG tablet TAKE 1 TABLET DAILY OR AS DIRECTED FOR CHOLESTEROL 05/27/16   Vicie Mutters, PA-C  CHANTIX 1 MG tablet TAKE ONE  TABLET BY MOUTH TWICE DAILY Patient not taking: Reported on 01/24/2016 01/07/15   Vicie Mutters, PA-C  Cholecalciferol (VITAMIN D PO) Take 5,000 Units by mouth 2 (two) times daily.    Historical Provider, MD  cyclobenzaprine (FLEXERIL) 10 MG tablet TAKE 1 TABLET (10 MG TOTAL) BY MOUTH EVERY 8 (EIGHT) HOURS AS NEEDED FOR MUSCLE SPASMS. 06/29/15   Historical Provider, MD  HYDROcodone-acetaminophen (NORCO/VICODIN) 5-325 MG tablet Take 1-2 tablets by mouth every 4 (four) hours as needed for moderate pain or severe pain. 06/04/16   Clayton Bibles, PA-C  ibuprofen (ADVIL,MOTRIN) 800 MG tablet Take 1 tablet (800 mg total) by mouth every 8 (eight) hours as needed for mild pain or moderate pain. 06/04/16   Clayton Bibles, PA-C  lisinopril (PRINIVIL,ZESTRIL) 40 MG tablet TAKE ONE TABLET BY MOUTH ONE TIME DAILY 04/23/16   Starlyn Skeans, PA-C  testosterone cypionate (DEPOTESTOSTERONE CYPIONATE) 200 MG/ML injection INJECT 2CC INTRAMUSCULAR EVERY 2 WEEKS 03/14/16   Unk Pinto, MD    Family History Family History  Problem Relation Age of Onset  . Hyperlipidemia Mother   . Hyperlipidemia Father   . Cancer Paternal Grandfather     Lung    Social History Social History  Substance Use Topics  . Smoking status: Current Every Day Smoker    Types: Cigarettes    Last attempt to quit: 06/21/2015  . Smokeless tobacco: Former Systems developer    Quit date: 11/25/2014  . Alcohol use 2.4 oz/week  4 Standard drinks or equivalent per week     Comment: moderate     Allergies   Patient has no known allergies.   Review of Systems Review of Systems  Constitutional: Negative for chills and fever.  Cardiovascular: Negative for leg swelling.  Musculoskeletal: Positive for arthralgias and joint swelling.  Skin: Negative for color change and wound.  Allergic/Immunologic: Negative for immunocompromised state.  Neurological: Negative for weakness and numbness.  Hematological: Does not bruise/bleed easily.    Psychiatric/Behavioral: Negative for self-injury.     Physical Exam Updated Vital Signs BP 140/74 (BP Location: Right Arm)   Pulse 76   Temp 97.5 F (36.4 C) (Oral)   Resp 16   Ht 5\' 9"  (1.753 m)   Wt 230 lb (104.3 kg)   SpO2 98%   BMI 33.97 kg/m   Physical Exam  Constitutional: He appears well-developed and well-nourished. No distress.  HENT:  Head: Normocephalic and atraumatic.  Neck: Neck supple.  Cardiovascular: Normal rate.   Distal pulses intact.  Pulmonary/Chest: Effort normal.  Musculoskeletal: He exhibits edema and tenderness. He exhibits no deformity.  Right knee with flexion to 90 degrees, nearly full extension. Pain with anterior stress. No laxity of joint. Joint effusion present. No calf edema or tenderness. No thigh tenderness. Compartments soft.  Neurological: He is alert.  Distal sensations intact.  Skin: Skin is warm and dry. He is not diaphoretic. No erythema.  No erythema or warmth.  Nursing note and vitals reviewed.    ED Treatments / Results  DIAGNOSTIC STUDIES: Oxygen Saturation is 98% on RA, normal by my interpretation.   COORDINATION OF CARE: 10:50 AM- Will X-Ray right knee. Pt verbalizes understanding and agrees to plan.  11:29 AM- Will prescribe pain medication, knee sleeve and crutches. Will give referral to orthopedist.   Medications  ibuprofen (ADVIL,MOTRIN) tablet 800 mg (not administered)    Labs (all labs ordered are listed, but only abnormal results are displayed) Labs Reviewed - No data to display  EKG  EKG Interpretation None       Radiology Dg Knee Complete 4 Views Right  Result Date: 06/04/2016 CLINICAL DATA:  Right knee pain for 2 days after injury. EXAM: RIGHT KNEE - COMPLETE 4+ VIEW COMPARISON:  None. FINDINGS: Small to moderate suprapatellar right knee joint effusion. No fracture, dislocation, suspicious focal osseous lesion or appreciable arthropathy. No radiopaque foreign body. IMPRESSION: Small to moderate  suprapatellar right knee joint effusion. No fracture or malalignment. Electronically Signed   By: Ilona Sorrel M.D.   On: 06/04/2016 11:08    Procedures Procedures (including critical care time)  Medications Ordered in ED Medications  ibuprofen (ADVIL,MOTRIN) tablet 800 mg (not administered)     Initial Impression / Assessment and Plan / ED Course  I have reviewed the triage vital signs and the nursing notes.  Pertinent labs & imaging results that were available during my care of the patient were reviewed by me and considered in my medical decision making (see chart for details).  Clinical Course     Afebrile, nontoxic patient with injury to his right knee while slipping in the snow.   Xray demonstrates effusion.  Pain with anterior drawer testing.  Neurovascularly intact.  D/C home with knee immobilizer, crutches, pain medication, orthopedic follow up.  Discussed result, findings, treatment, and follow up  with patient.  Pt given return precautions.  Pt verbalizes understanding and agrees with plan.      I personally performed the services described in this documentation,  which was scribed in my presence. The recorded information has been reviewed and is accurate.   Final Clinical Impressions(s) / ED Diagnoses   Final diagnoses:  Injury of right knee, initial encounter    New Prescriptions New Prescriptions   HYDROCODONE-ACETAMINOPHEN (NORCO/VICODIN) 5-325 MG TABLET    Take 1-2 tablets by mouth every 4 (four) hours as needed for moderate pain or severe pain.   IBUPROFEN (ADVIL,MOTRIN) 800 MG TABLET    Take 1 tablet (800 mg total) by mouth every 8 (eight) hours as needed for mild pain or moderate pain.     Clayton Bibles, PA-C 06/04/16 1247    Leo Grosser, MD 06/05/16 [phone]

## 2016-06-04 NOTE — Discharge Instructions (Signed)
Read the information below.  Use the prescribed medication as directed.  Please discuss all new medications with your pharmacist.  Do not take additional tylenol while taking the prescribed pain medication to avoid overdose.  You may return to the Emergency Department at any time for worsening condition or any new symptoms that concern you.  If you develop uncontrolled pain, weakness or numbness of the extremity, severe discoloration of the skin, or you are unable to move your knee or walk, return to the ER for a recheck.

## 2016-08-09 ENCOUNTER — Encounter: Payer: Self-pay | Admitting: Internal Medicine

## 2016-08-09 ENCOUNTER — Ambulatory Visit (INDEPENDENT_AMBULATORY_CARE_PROVIDER_SITE_OTHER): Payer: BLUE CROSS/BLUE SHIELD | Admitting: Internal Medicine

## 2016-08-09 VITALS — BP 126/72 | HR 72 | Temp 97.5°F | Resp 16 | Ht 69.0 in | Wt 208.4 lb

## 2016-08-09 DIAGNOSIS — E559 Vitamin D deficiency, unspecified: Secondary | ICD-10-CM

## 2016-08-09 DIAGNOSIS — Z79899 Other long term (current) drug therapy: Secondary | ICD-10-CM

## 2016-08-09 DIAGNOSIS — E782 Mixed hyperlipidemia: Secondary | ICD-10-CM

## 2016-08-09 DIAGNOSIS — R7303 Prediabetes: Secondary | ICD-10-CM | POA: Diagnosis not present

## 2016-08-09 DIAGNOSIS — I10 Essential (primary) hypertension: Secondary | ICD-10-CM | POA: Diagnosis not present

## 2016-08-09 DIAGNOSIS — E291 Testicular hypofunction: Secondary | ICD-10-CM | POA: Diagnosis not present

## 2016-08-09 LAB — LIPID PANEL
CHOLESTEROL: 137 mg/dL (ref <200)
HDL: 27 mg/dL — ABNORMAL LOW (ref >40)
LDL Cholesterol: 59 mg/dL (ref <100)
TRIGLYCERIDES: 256 mg/dL — AB (ref <150)
Total CHOL/HDL Ratio: 5.1 Ratio — ABNORMAL HIGH (ref <5.0)
VLDL: 51 mg/dL — AB (ref <30)

## 2016-08-09 LAB — CBC WITH DIFFERENTIAL/PLATELET
Basophils Absolute: 80 cells/uL (ref 0–200)
Basophils Relative: 1 %
EOS PCT: 2 %
Eosinophils Absolute: 160 cells/uL (ref 15–500)
HCT: 49.6 % (ref 38.5–50.0)
Hemoglobin: 16.4 g/dL (ref 13.2–17.1)
LYMPHS ABS: 1600 {cells}/uL (ref 850–3900)
LYMPHS PCT: 20 %
MCH: 30.5 pg (ref 27.0–33.0)
MCHC: 33.1 g/dL (ref 32.0–36.0)
MCV: 92.2 fL (ref 80.0–100.0)
MONOS PCT: 10 %
MPV: 11.8 fL (ref 7.5–12.5)
Monocytes Absolute: 800 cells/uL (ref 200–950)
NEUTROS PCT: 67 %
Neutro Abs: 5360 cells/uL (ref 1500–7800)
PLATELETS: 306 10*3/uL (ref 140–400)
RBC: 5.38 MIL/uL (ref 4.20–5.80)
RDW: 13.6 % (ref 11.0–15.0)
WBC: 8 10*3/uL (ref 3.8–10.8)

## 2016-08-09 LAB — BASIC METABOLIC PANEL WITH GFR
BUN: 8 mg/dL (ref 7–25)
CALCIUM: 9.5 mg/dL (ref 8.6–10.3)
CO2: 26 mmol/L (ref 20–31)
CREATININE: 1 mg/dL (ref 0.60–1.35)
Chloride: 106 mmol/L (ref 98–110)
Glucose, Bld: 92 mg/dL (ref 65–99)
Potassium: 4.5 mmol/L (ref 3.5–5.3)
SODIUM: 142 mmol/L (ref 135–146)

## 2016-08-09 LAB — HEPATIC FUNCTION PANEL
ALT: 23 U/L (ref 9–46)
AST: 19 U/L (ref 10–40)
Albumin: 4.1 g/dL (ref 3.6–5.1)
Alkaline Phosphatase: 72 U/L (ref 40–115)
BILIRUBIN DIRECT: 0.1 mg/dL (ref <=0.2)
BILIRUBIN INDIRECT: 0.4 mg/dL (ref 0.2–1.2)
Total Bilirubin: 0.5 mg/dL (ref 0.2–1.2)
Total Protein: 6.3 g/dL (ref 6.1–8.1)

## 2016-08-09 LAB — TSH: TSH: 1.6 mIU/L (ref 0.40–4.50)

## 2016-08-09 NOTE — Patient Instructions (Signed)

## 2016-08-09 NOTE — Progress Notes (Signed)
[facility] Unk Pinto, M.D.        Uvaldo Bristle. Silverio Lay, P.A.-C       Starlyn Skeans, P.A.-C  [facility]                [address] [place], [place] [id number] Telephone [phone] Telefax [phone] ______________________________________________________________________     This very nice 41 y.o. MWM presents for 6 month follow up with Hypertension, Hyperlipidemia, Pre-Diabetes, Testosterone Deficiency and Vitamin D Deficiency.      Patient is treated for HTN (2007) & BP has been controlled at home. Today's BP is at goal - 126/72. Patient has had no complaints of any cardiac type chest pain, palpitations, dyspnea/orthopnea/PND, dizziness, claudication, or dependent edema.     Hyperlipidemia is controlled with diet & meds. Patient denies myalgias or other med SE's. Last Lipids were at goal albeit very elevated Trig's: Lab Results  Component Value Date   CHOL 182 01/24/2016   HDL 28 (L) 01/24/2016   LDLCALC NOT CALC 01/24/2016   TRIG 598 (H) 01/24/2016   CHOLHDL 6.5 (H) 01/24/2016      Patient had a low Testosterone (218 in 2012 & 201 in 2014) and has been on replacement parenterally with improved stamina and well-being. Also, the patient has history of morbid Obesity (BMI 32+) and consequent PreDiabetes (A1c 5.7% in 2015) and has had no symptoms of reactive hypoglycemia, diabetic polys, paresthesias or visual blurring.  Last A1c was normal & at goal: Lab Results  Component Value Date   HGBA1C 5.5 01/24/2016      Further, the patient also has history of Vitamin D Deficiency ("13" in 2008)  and supplements vitamin D without any suspected side-effects. Last vitamin D was at goal: Lab Results  Component Value Date   VD25OH 68 01/24/2016   Current Outpatient Prescriptions on File Prior to Visit  Medication Sig  . atorvastatin (LIPITOR) 80 MG tablet TAKE 1 TABLET DAILY OR AS DIRECTED   .  CHANTIX 1 MG tablet TAKE ONE TAB TWICE DAILY  . VITAMIN D5,000 Units Take   2 x daily.  . cyclobenzaprine  10 MG tablet TAKE 1 TAB EVERY 8 HRS AS NEEDED   . lisinopril 40 MG tablet TAKE ONE TABLET BY MOUTH ONE TIME DAILY  . testosterone cypio 200 MG/ML inj INJECT 2CC INTRAMUSCULAR EVERY 2 WEEKS   No Known Allergies   PMHx:   Past Medical History:  Diagnosis Date  . Hyperlipidemia   . Hypertension   . Hypogonadism male   . Obesity   . Prediabetes   . Vitamin D deficiency    Immunization History  Administered Date(s) Administered  . Influenza Split 03/31/2014, 04/14/2015  . PPD Test 12/24/2013, 12/29/2014, 01/25/2016  . Pneumococcal-Unspecified 12/05/2011  . Tdap 12/05/2011   Past Surgical History:  Procedure Laterality Date  . CERVICAL FUSION     FHx:    Reviewed / unchanged  SHx:    Reviewed / unchanged  Systems Review:  Constitutional: Denies fever, chills, wt changes, headaches, insomnia, fatigue, night sweats, change in appetite. Eyes: Denies redness, blurred vision, diplopia, discharge, itchy, watery eyes.  ENT: Denies discharge, congestion, post nasal drip, epistaxis, sore throat, earache, hearing loss, dental pain, tinnitus, vertigo, sinus pain, snoring.  CV: Denies chest pain, palpitations, irregular heartbeat, syncope, dyspnea, diaphoresis, orthopnea, PND, claudication or edema. Respiratory: denies cough, dyspnea, DOE, pleurisy, hoarseness,  laryngitis, wheezing.  Gastrointestinal: Denies dysphagia, odynophagia, heartburn, reflux, water brash, abdominal pain or cramps, nausea, vomiting, bloating, diarrhea, constipation, hematemesis, melena, hematochezia  or hemorrhoids. Genitourinary: Denies dysuria, frequency, urgency, nocturia, hesitancy, discharge, hematuria or flank pain. Musculoskeletal: Denies arthralgias, myalgias, stiffness, jt. swelling, pain, limping or strain/sprain.  Skin: Denies pruritus, rash, hives, warts, acne, eczema or change in skin  lesion(s). Neuro: No weakness, tremor, incoordination, spasms, paresthesia or pain. Psychiatric: Denies confusion, memory loss or sensory loss. Endo: Denies change in weight, skin or hair change.  Heme/Lymph: No excessive bleeding, bruising or enlarged lymph nodes.  Physical Exam  BP 126/72   Pulse 72   Temp 97.5 F (36.4 C)   Resp 16   Ht 5\' 9"  (1.753 m)   Wt 208 lb 6.4 oz (94.5 kg)   BMI 30.78 kg/m   Appears well nourished and in no distress.  Eyes: PERRLA, EOMs, conjunctiva no swelling or erythema. Sinuses: No frontal/maxillary tenderness ENT/Mouth: EAC's clear, TM's nl w/o erythema, bulging. Nares clear w/o erythema, swelling, exudates. Oropharynx clear without erythema or exudates. Oral hygiene is good. Tongue normal, non obstructing. Hearing intact.  Neck: Supple. Thyroid nl. Car 2+/2+ without bruits, nodes or JVD. Chest: Respirations nl with BS clear & equal w/o rales, rhonchi, wheezing or stridor.  Cor: Heart sounds normal w/ regular rate and rhythm without sig. murmurs, gallops, clicks, or rubs. Peripheral pulses normal and equal  without edema.  Abdomen: Soft & bowel sounds normal. Non-tender w/o guarding, rebound, hernias, masses, or organomegaly.  Lymphatics: Unremarkable.  Musculoskeletal: Full ROM all peripheral extremities, joint stability, 5/5 strength, and normal gait.  Skin: Warm, dry without exposed rashes, lesions or ecchymosis apparent.  Neuro: Cranial nerves intact, reflexes equal bilaterally. Sensory-motor testing grossly intact. Tendon reflexes grossly intact.  Pysch: Alert & oriented x 3.  Insight and judgement nl & appropriate. No ideations.  Assessment and Plan:   1. Essential hypertension  - Continue medication, monitor blood pressure at home.  - Continue DASH diet. Reminder to go to the ER if any CP,  SOB, nausea, dizziness, severe HA, changes vision/speech,  left arm numbness and tingling and jaw pain.  - CBC with Differential/Platelet - BASIC  METABOLIC PANEL WITH GFR - TSH  2. Hyperlipidemia  - Continue diet/meds, exercise,& lifestyle modifications.  - Continue monitor periodic cholesterol/liver & renal functions  - Hepatic function panel - Lipid panel - TSH  3. Prediabetes  - Continue diet, exercise, lifestyle modifications.  - Monitor appropriate labs.  - Hemoglobin A1c - Insulin, random  4. Vitamin D deficiency  - Continue supplementation.  - VITAMIN D 25 Hydroxy  5. Testosterone Deficiency  - Testosterone  6. Medication management  - CBC with Differential/Platelet - BASIC METABOLIC PANEL WITH GFR - Hepatic function panel - Magnesium - Lipid panel - VITAMIN D 25 Hydroxy  - Testosterone      Recommended regular exercise, BP monitoring, weight control, and discussed med and SE's. Recommended labs to assess and monitor clinical status. Further disposition pending results of labs. Over 30 minutes of exam, counseling, chart review was performed

## 2016-08-10 LAB — TESTOSTERONE: TESTOSTERONE: 681 ng/dL (ref 250–827)

## 2016-08-10 LAB — HEMOGLOBIN A1C
HEMOGLOBIN A1C: 4.9 % (ref <5.7)
Mean Plasma Glucose: 94 mg/dL

## 2016-08-10 LAB — MAGNESIUM: MAGNESIUM: 2.1 mg/dL (ref 1.5–2.5)

## 2016-08-10 LAB — VITAMIN D 25 HYDROXY (VIT D DEFICIENCY, FRACTURES): VIT D 25 HYDROXY: 65 ng/mL (ref 30–100)

## 2016-08-10 LAB — INSULIN, RANDOM: Insulin: 11.6 u[IU]/mL (ref 2.0–19.6)

## 2016-08-15 ENCOUNTER — Other Ambulatory Visit: Payer: Self-pay | Admitting: Physician Assistant

## 2016-08-29 ENCOUNTER — Telehealth: Payer: Self-pay | Admitting: *Deleted

## 2016-08-29 NOTE — Telephone Encounter (Signed)
[facility] informed the patient's Testosterone has been approved 08/25/2016 until 06/25/2038.

## 2016-10-16 ENCOUNTER — Other Ambulatory Visit: Payer: Self-pay

## 2016-10-16 MED ORDER — LISINOPRIL 40 MG PO TABS: 40.0000 mg | ORAL_TABLET | Freq: Every day | ORAL | 1 refills | Status: DC

## 2016-11-15 ENCOUNTER — Ambulatory Visit: Payer: Self-pay | Admitting: Internal Medicine

## 2016-11-21 ENCOUNTER — Telehealth: Payer: Self-pay

## 2016-11-21 ENCOUNTER — Other Ambulatory Visit: Payer: Self-pay | Admitting: Internal Medicine

## 2016-11-21 DIAGNOSIS — E349 Endocrine disorder, unspecified: Secondary | ICD-10-CM

## 2016-11-21 NOTE — Telephone Encounter (Signed)
Testosterone was called into pharmacy @ 1:45pm on 29th may 2018 by DD

## 2016-11-26 NOTE — Progress Notes (Deleted)
Assessment and Plan:  Hypertension: Continue medication, monitor blood pressure at home. Continue DASH diet.  Reminder to go to the ER if any CP, SOB, nausea, dizziness, severe HA, changes vision/speech, left arm numbness and tingling, and jaw pain. Cholesterol: Continue diet and exercise. Check cholesterol.  Pre-diabetes-Continue diet and exercise. Check A1C Vitamin D Def- check level and continue medications.  Obesity with co morbidities- long discussion about weight loss, diet, and exercise Hypogonadism- continue replacement therapy, check testosterone levels as needed.   Continue diet and meds as discussed. Further disposition pending results of labs.  HPI 41 y.o. male  presents for 3 month follow up with hypertension, hyperlipidemia, prediabetes and vitamin D. His blood pressure has been controlled at home, today their BP is   He does not workout, walks about 12,000 at work.Marland Kitchen He denies chest pain, shortness of breath, dizziness.  He is on cholesterol medication, lipitor 80mg  1/2 pill every other day and denies myalgias. His cholesterol is at goal. The cholesterol last visit was:   Lab Results  Component Value Date   CHOL 137 08/09/2016   HDL 27 (L) 08/09/2016   LDLCALC 59 08/09/2016   TRIG 256 (H) 08/09/2016   CHOLHDL 5.1 (H) 08/09/2016  He has been working on diet and exercise for prediabetes, and denies paresthesia of the feet, polydipsia, polyuria and visual disturbances. Last A1C in the office was:  Lab Results  Component Value Date   HGBA1C 4.9 08/09/2016  Patient is on Vitamin D supplement.   Lab Results  Component Value Date   VD25OH 35 08/09/2016  He has a history of testosterone deficiency and is on testosterone replacement, once a month. He states that the testosterone helps with his energy, libido, muscle mass. Lab Results  Component Value Date   TESTOSTERONE 681 08/09/2016   BMI is There is no height or weight on file to calculate BMI., he is working on diet and  exercise. Wt Readings from Last 3 Encounters:  08/09/16 208 lb 6.4 oz (94.5 kg)  06/04/16 230 lb (104.3 kg)  01/24/16 222 lb 9.6 oz (101 kg)    Current Medications:  Current Outpatient Prescriptions on File Prior to Visit  Medication Sig Dispense Refill  . atorvastatin (LIPITOR) 80 MG tablet TAKE 1 TABLET DAILY OR AS DIRECTED FOR CHOLESTEROL 30 tablet 1  . CHANTIX 1 MG tablet TAKE ONE TABLET BY MOUTH TWICE DAILY 60 tablet 3  . Cholecalciferol (VITAMIN D PO) Take 5,000 Units by mouth 2 (two) times daily.    . cyclobenzaprine (FLEXERIL) 10 MG tablet TAKE 1 TABLET (10 MG TOTAL) BY MOUTH EVERY 8 (EIGHT) HOURS AS NEEDED FOR MUSCLE SPASMS.  2  . ibuprofen (ADVIL,MOTRIN) 200 MG tablet Take 200 mg by mouth every 6 (six) hours as needed.    Marland Kitchen lisinopril (PRINIVIL,ZESTRIL) 40 MG tablet Take 1 tablet (40 mg total) by mouth daily. 90 tablet 1  . testosterone cypionate (DEPOTESTOSTERONE CYPIONATE) 200 MG/ML injection INJECT 2 CC INTRAMUSCULAR EVERY 2 WEEKS 10 mL 0   No current facility-administered medications on file prior to visit.    Medical History:  Past Medical History:  Diagnosis Date  . Hyperlipidemia   . Hypertension   . Hypogonadism male   . Obesity   . Prediabetes   . Vitamin D deficiency    Allergies: No Known Allergies   Review of Systems:  Review of Systems  Constitutional: Negative.   HENT: Negative.   Eyes: Negative.   Respiratory: Negative.   Cardiovascular: Negative.  Gastrointestinal: Negative.   Genitourinary: Negative.   Musculoskeletal: Positive for joint pain (left knee). Negative for back pain, falls, myalgias and neck pain.  Skin: Negative.   Psychiatric/Behavioral: Negative.      Family history- Review and unchanged Social history- Review and unchanged Physical Exam: There were no vitals taken for this visit. Wt Readings from Last 3 Encounters:  08/09/16 208 lb 6.4 oz (94.5 kg)  06/04/16 230 lb (104.3 kg)  01/24/16 222 lb 9.6 oz (101 kg)   General  Appearance: Well nourished, in no apparent distress. Eyes: PERRLA, EOMs, conjunctiva no swelling or erythema Sinuses: No Frontal/maxillary tenderness ENT/Mouth: Ext aud canals clear, TMs without erythema, bulging. No erythema, swelling, or exudate on post pharynx.  Tonsils not swollen or erythematous. Hearing normal.  Neck: Supple, thyroid normal.  Respiratory: Respiratory effort normal, BS equal bilaterally without rales, rhonchi, wheezing or stridor.  Cardio: RRR with no MRGs. Brisk peripheral pulses without edema.  Abdomen: Soft, + BS.  Non tender, no guarding, rebound, hernias, masses. Lymphatics: Non tender without lymphadenopathy.  Musculoskeletal: Full ROM, 5/5 strength, normal gait. Skin: Warm, dry without rashes, lesions, ecchymosis.  Neuro: Cranial nerves intact. Normal muscle tone, no cerebellar symptoms. Sensation intact.  Psych: Awake and oriented X 3, normal affect, Insight and Judgment appropriate.    Vicie Mutters, PA-C 11:22 AM [facility]

## 2016-11-28 ENCOUNTER — Ambulatory Visit: Payer: Self-pay | Admitting: Physician Assistant

## 2016-12-28 NOTE — Progress Notes (Signed)
Assessment and Plan:   Essential hypertension - continue medications, DASH diet, exercise and monitor at home. Call if greater than 130/80.  -     CBC with Differential/Platelet -     BASIC METABOLIC PANEL WITH GFR -     Hepatic function panel -     TSH  Prediabetes Discussed general issues about diabetes pathophysiology and management., Educational material distributed., Suggested low cholesterol diet., Encouraged aerobic exercise., Discussed foot care., Reminded to get yearly retinal exam.  Morbid obesity (BMI 33.55)  - long discussion about weight loss, diet, and exercise  Hyperlipidemia -continue medications, check lipids, decrease fatty foods, increase activity.  -     Lipid panel  Medication management -     Magnesium  Testosterone Deficiency     Continue diet and meds as discussed. Further disposition pending results of labs. Future Appointments Date Time Provider [place]  03/01/2017 3:00 PM Unk Pinto, MD [id number] None    HPI 41 y.o. male  presents for 3 month follow up with hypertension, hyperlipidemia, prediabetes and vitamin D. His blood pressure has been controlled at home, today their BP is BP: 130/80 He does not workout, not walking as much at new job. He denies chest pain, shortness of breath, dizziness.  He is on cholesterol medication, lipitor 80mg  1/2 pill every other day and denies myalgias. His cholesterol is at goal. The cholesterol last visit was:   Lab Results  Component Value Date   CHOL 137 08/09/2016   HDL 27 (L) 08/09/2016   LDLCALC 59 08/09/2016   TRIG 256 (H) 08/09/2016   CHOLHDL 5.1 (H) 08/09/2016  He has been working on diet and exercise for prediabetes, and denies paresthesia of the feet, polydipsia, polyuria and visual disturbances. Last A1C in the office was:  Lab Results  Component Value Date   HGBA1C 4.9 08/09/2016  Patient is on Vitamin D supplement.   Lab Results  Component Value Date   VD25OH 62 08/09/2016  He  has a history of testosterone deficiency and is on testosterone replacement, last testosterone shot was on monday He states that the testosterone helps with his energy, libido, muscle mass. Lab Results  Component Value Date   TESTOSTERONE 681 08/09/2016   BMI is Body mass index is 31.69 kg/m., he is working on diet and exercise. Wt Readings from Last 3 Encounters:  01/01/17 214 lb 9.6 oz (97.3 kg)  08/09/16 208 lb 6.4 oz (94.5 kg)  06/04/16 230 lb (104.3 kg)    Current Medications:  Current Outpatient Prescriptions on File Prior to Visit  Medication Sig Dispense Refill  . atorvastatin (LIPITOR) 80 MG tablet TAKE 1 TABLET DAILY OR AS DIRECTED FOR CHOLESTEROL 30 tablet 1  . testosterone cypionate (DEPOTESTOSTERONE CYPIONATE) 200 MG/ML injection INJECT 2 CC INTRAMUSCULAR EVERY 2 WEEKS 10 mL 0  . CHANTIX 1 MG tablet TAKE ONE TABLET BY MOUTH TWICE DAILY (Patient not taking: Reported on 01/01/2017) 60 tablet 3   No current facility-administered medications on file prior to visit.    Medical History:  Past Medical History:  Diagnosis Date  . Hyperlipidemia   . Hypertension   . Hypogonadism male   . Obesity   . Prediabetes   . Vitamin D deficiency    Allergies: No Known Allergies   Review of Systems:  Review of Systems  Constitutional: Negative.   HENT: Negative.   Eyes: Negative.   Respiratory: Negative.   Cardiovascular: Negative.   Gastrointestinal: Negative.   Genitourinary: Negative.   Musculoskeletal:  Positive for joint pain (left knee). Negative for back pain, falls, myalgias and neck pain.  Skin: Negative.   Psychiatric/Behavioral: Negative.      Family history- Review and unchanged Social history- Review and unchanged Physical Exam: BP 130/80   Pulse 78   Temp 97.9 F (36.6 C)   Resp 18   Ht 5\' 9"  (1.753 m)   Wt 214 lb 9.6 oz (97.3 kg)   SpO2 97%   BMI 31.69 kg/m  Wt Readings from Last 3 Encounters:  01/01/17 214 lb 9.6 oz (97.3 kg)  08/09/16 208 lb 6.4  oz (94.5 kg)  06/04/16 230 lb (104.3 kg)   General Appearance: Well nourished, in no apparent distress. Eyes: PERRLA, EOMs, conjunctiva no swelling or erythema Sinuses: No Frontal/maxillary tenderness ENT/Mouth: Ext aud canals clear, TMs without erythema, bulging. No erythema, swelling, or exudate on post pharynx.  Tonsils not swollen or erythematous. Hearing normal.  Neck: Supple, thyroid normal.  Respiratory: Respiratory effort normal, BS equal bilaterally without rales, rhonchi, wheezing or stridor.  Cardio: RRR with no MRGs. Brisk peripheral pulses without edema.  Abdomen: Soft, + BS.  Non tender, no guarding, rebound, hernias, masses. Lymphatics: Non tender without lymphadenopathy.  Musculoskeletal: Full ROM, 5/5 strength, normal gait. Skin: Warm, dry without rashes, lesions, ecchymosis.  Neuro: Cranial nerves intact. Normal muscle tone, no cerebellar symptoms. Sensation intact.  Psych: Awake and oriented X 3, normal affect, Insight and Judgment appropriate.    Vicie Mutters, PA-C 2:57 PM [facility]

## 2017-01-01 ENCOUNTER — Encounter: Payer: Self-pay | Admitting: Physician Assistant

## 2017-01-01 ENCOUNTER — Ambulatory Visit (INDEPENDENT_AMBULATORY_CARE_PROVIDER_SITE_OTHER): Payer: 59 | Admitting: Physician Assistant

## 2017-01-01 VITALS — BP 130/80 | HR 78 | Temp 97.9°F | Resp 18 | Ht 69.0 in | Wt 214.6 lb

## 2017-01-01 DIAGNOSIS — R7303 Prediabetes: Secondary | ICD-10-CM

## 2017-01-01 DIAGNOSIS — Z79899 Other long term (current) drug therapy: Secondary | ICD-10-CM | POA: Diagnosis not present

## 2017-01-01 DIAGNOSIS — I10 Essential (primary) hypertension: Secondary | ICD-10-CM

## 2017-01-01 DIAGNOSIS — E291 Testicular hypofunction: Secondary | ICD-10-CM

## 2017-01-01 DIAGNOSIS — E782 Mixed hyperlipidemia: Secondary | ICD-10-CM

## 2017-01-01 LAB — CBC WITH DIFFERENTIAL/PLATELET
BASOS ABS: 0 {cells}/uL (ref 0–200)
Basophils Relative: 0 %
EOS ABS: 87 {cells}/uL (ref 15–500)
Eosinophils Relative: 1 %
HEMATOCRIT: 53.2 % — AB (ref 38.5–50.0)
HEMOGLOBIN: 17.8 g/dL — AB (ref 13.2–17.1)
LYMPHS ABS: 1566 {cells}/uL (ref 850–3900)
LYMPHS PCT: 18 %
MCH: 31.3 pg (ref 27.0–33.0)
MCHC: 33.5 g/dL (ref 32.0–36.0)
MCV: 93.5 fL (ref 80.0–100.0)
MONO ABS: 696 {cells}/uL (ref 200–950)
MPV: 11.6 fL (ref 7.5–12.5)
Monocytes Relative: 8 %
NEUTROS PCT: 73 %
Neutro Abs: 6351 cells/uL (ref 1500–7800)
Platelets: 245 10*3/uL (ref 140–400)
RBC: 5.69 MIL/uL (ref 4.20–5.80)
RDW: 13.3 % (ref 11.0–15.0)
WBC: 8.7 10*3/uL (ref 3.8–10.8)

## 2017-01-01 LAB — HEPATIC FUNCTION PANEL
ALBUMIN: 4.2 g/dL (ref 3.6–5.1)
ALK PHOS: 78 U/L (ref 40–115)
ALT: 19 U/L (ref 9–46)
AST: 18 U/L (ref 10–40)
BILIRUBIN INDIRECT: 0.4 mg/dL (ref 0.2–1.2)
Bilirubin, Direct: 0.1 mg/dL (ref <=0.2)
TOTAL PROTEIN: 6.4 g/dL (ref 6.1–8.1)
Total Bilirubin: 0.5 mg/dL (ref 0.2–1.2)

## 2017-01-01 LAB — BASIC METABOLIC PANEL WITH GFR
BUN: 9 mg/dL (ref 7–25)
CALCIUM: 9.6 mg/dL (ref 8.6–10.3)
CO2: 23 mmol/L (ref 20–31)
Chloride: 104 mmol/L (ref 98–110)
Creat: 0.93 mg/dL (ref 0.60–1.35)
GFR, Est African American: >89 mL/min (ref >=60)
GLUCOSE: 89 mg/dL (ref 65–99)
Potassium: 4.3 mmol/L (ref 3.5–5.3)
Sodium: 140 mmol/L (ref 135–146)

## 2017-01-01 LAB — LIPID PANEL
Cholesterol: 169 mg/dL (ref <200)
HDL: 26 mg/dL — ABNORMAL LOW (ref >40)
Total CHOL/HDL Ratio: 6.5 Ratio — ABNORMAL HIGH (ref <5.0)
Triglycerides: 588 mg/dL — ABNORMAL HIGH (ref <150)

## 2017-01-01 LAB — TSH: TSH: 0.97 mIU/L (ref 0.40–4.50)

## 2017-01-01 NOTE — Patient Instructions (Addendum)
Drink 100-120 oz of water a day, measure it out Eat 3 meals a day, try protein shake, protein bar, boiled egg.      Bad carbs also include fruit juice, alcohol, and sweet tea. These are empty calories that do not signal to your brain that you are full.   Please remember the good carbs are still carbs which convert into sugar. So please measure them out no more than 1/2-1 cup of rice, oatmeal, pasta, and beans  Veggies are however free foods! Pile them on.   Not all fruit is created equal. Please see the list below, the fruit at the bottom is higher in sugars than the fruit at the top. Please avoid all dried fruits.     We want weight loss that will last so you should lose 1-2 pounds a week.  THAT IS IT! Please pick THREE things a month to change. Once it is a habit check off the item. Then pick another three items off the list to become habits.  If you are already doing a habit on the list GREAT!  Cross that item off! o Don't drink your calories. Ie, alcohol, soda, fruit juice, and sweet tea.  o Drink more water. Drink a glass when you feel hungry or before each meal.  o Eat breakfast - Complex carb and protein (likeDannon light and fit yogurt, oatmeal, fruit, eggs, [place] bacon). o Measure your cereal.  Eat no more than one cup a day. (ie [place]) o Eat an apple a day. o Add a vegetable a day. o Try a new vegetable a month. o Use Pam! Stop using oil or butter to cook. o Don't finish your plate or use smaller plates. o Share your dessert. o Eat sugar free Jello for dessert or frozen grapes. o Don't eat 2-3 hours before bed. o Switch to whole wheat bread, pasta, and brown rice. o Make healthier choices when you eat out. No fries! o Pick baked chicken, NOT fried. o Don't forget to SLOW DOWN when you eat. It is not going anywhere.  o Take the stairs. o Park far away in the parking lot o [employer] (or weights) for 10 minutes while watching TV. o Walk at work for 10 minutes during  break. o Walk outside 1 time a week with your friend, kids, dog, or significant other. o Start a walking group at [place] the mall as much as you can tolerate.  o Keep a food diary. o Weigh yourself daily. o Walk for 15 minutes 3 days per week. o Cook at home more often and eat out less.  If life happens and you go back to old habits, it is okay.  Just start over. You can do it!   If you experience chest pain, get short of breath, or tired during the exercise, please stop immediately and inform your doctor.   If you have a smart phone, please look up Smoke Free app, this will help you stay on track and give you information about money you have saved, life that you have gained back and a ton of more information.   We are giving you chantix for smoking cessation. You can do it! And we are here to help! You may have heard some scary side effects about chantix, the three most common I hear about are nausea, crazy dreams and depression.  However, I like for my patients to try to stay on 1/2 a tablet twice a day rather than  one tablet twice a day as normally prescribed. This helps decrease the chances of side effects and helps save money by making a one month prescription last two months  Please start the prescription this way:  Start 1/2 tablet by mouth once daily after food with a full glass of water for 3 days Then do 1/2 tablet by mouth twice daily for 4 days. During this first week you can smoke, but try to stop after this week.  At this point we have several options: 1) continue on 1/2 tablet twice a day- which I encourage you to do. You can stay on this dose the rest of the time on the medication or if you still feel the need to smoke you can do one of the two options below. 2) do one tablet in the morning and 1/2 in the evening which helps decrease dreams. 3) do one tablet twice a day.   What if I miss a dose? If you miss a dose, take it as soon as you can. If it is almost time  for your next dose, take only that dose. Do not take double or extra doses.  What should I watch for while using this medicine? Visit your doctor or health care professional for regular check ups. Ask for ongoing advice and encouragement from your doctor or healthcare professional, friends, and family to help you quit. If you smoke while on this medication, quit again  Your mouth may get dry. Chewing sugarless gum or hard candy, and drinking plenty of water may help. Contact your doctor if the problem does not go away or is severe.  You may get drowsy or dizzy. Do not drive, use machinery, or do anything that needs mental alertness until you know how this medicine affects you. Do not stand or sit up quickly, especially if you are an older patient.   The use of this medicine may increase the chance of suicidal thoughts or actions. Pay special attention to how you are responding while on this medicine. Any worsening of mood, or thoughts of suicide or dying should be reported to your health care professional right away.  ADVANTAGES OF QUITTING SMOKING  Within 20 minutes, blood pressure decreases. Your pulse is at normal level.  After 8 hours, carbon monoxide levels in the blood return to normal. Your oxygen level increases.  After 24 hours, the chance of having a heart attack starts to decrease. Your breath, hair, and body stop smelling like smoke.  After 48 hours, damaged nerve endings begin to recover. Your sense of taste and smell improve.  After 72 hours, the body is virtually free of nicotine. Your bronchial tubes relax and breathing becomes easier.  After 2 to 12 weeks, lungs can hold more air. Exercise becomes easier and circulation improves.  After 1 year, the risk of coronary heart disease is cut in half.  After 5 years, the risk of stroke falls to the same as a nonsmoker.  After 10 years, the risk of lung cancer is cut in half and the risk of other cancers decreases  significantly.  After 15 years, the risk of coronary heart disease drops, usually to the level of a nonsmoker.  You will have extra money to spend on things other than cigarettes.

## 2017-01-02 LAB — MAGNESIUM: Magnesium: 2.1 mg/dL (ref 1.5–2.5)

## 2017-03-01 ENCOUNTER — Encounter: Payer: Self-pay | Admitting: Internal Medicine

## 2017-03-10 ENCOUNTER — Other Ambulatory Visit: Payer: Self-pay | Admitting: Physician Assistant

## 2017-04-16 ENCOUNTER — Ambulatory Visit (INDEPENDENT_AMBULATORY_CARE_PROVIDER_SITE_OTHER): Payer: 59 | Admitting: Internal Medicine

## 2017-04-16 ENCOUNTER — Encounter: Payer: Self-pay | Admitting: Internal Medicine

## 2017-04-16 VITALS — BP 118/84 | HR 72 | Temp 97.3°F | Resp 18 | Ht 69.0 in | Wt 216.0 lb

## 2017-04-16 DIAGNOSIS — Z1212 Encounter for screening for malignant neoplasm of rectum: Secondary | ICD-10-CM

## 2017-04-16 DIAGNOSIS — Z111 Encounter for screening for respiratory tuberculosis: Secondary | ICD-10-CM | POA: Diagnosis not present

## 2017-04-16 DIAGNOSIS — Z125 Encounter for screening for malignant neoplasm of prostate: Secondary | ICD-10-CM

## 2017-04-16 DIAGNOSIS — I10 Essential (primary) hypertension: Secondary | ICD-10-CM | POA: Diagnosis not present

## 2017-04-16 DIAGNOSIS — Z136 Encounter for screening for cardiovascular disorders: Secondary | ICD-10-CM | POA: Diagnosis not present

## 2017-04-16 DIAGNOSIS — Z0000 Encounter for general adult medical examination without abnormal findings: Secondary | ICD-10-CM | POA: Diagnosis not present

## 2017-04-16 DIAGNOSIS — E291 Testicular hypofunction: Secondary | ICD-10-CM

## 2017-04-16 DIAGNOSIS — E559 Vitamin D deficiency, unspecified: Secondary | ICD-10-CM

## 2017-04-16 DIAGNOSIS — Z0001 Encounter for general adult medical examination with abnormal findings: Secondary | ICD-10-CM

## 2017-04-16 DIAGNOSIS — Z1211 Encounter for screening for malignant neoplasm of colon: Secondary | ICD-10-CM

## 2017-04-16 DIAGNOSIS — E782 Mixed hyperlipidemia: Secondary | ICD-10-CM

## 2017-04-16 DIAGNOSIS — R7303 Prediabetes: Secondary | ICD-10-CM

## 2017-04-16 DIAGNOSIS — Z79899 Other long term (current) drug therapy: Secondary | ICD-10-CM

## 2017-04-16 NOTE — Patient Instructions (Signed)

## 2017-04-16 NOTE — Progress Notes (Signed)
[facility]   Unk Pinto, M.D.     Uvaldo Bristle. Silverio Lay, P.A.-C Liane Comber, Terry                [address] [place] [address] Telephone [phone] Telefax [phone] Annual  Screening/Preventative Visit  & Comprehensive Evaluation & Examination     This very nice 41 y.o. MWM presents for a Screening/Preventative Visit & comprehensive evaluation and management of multiple medical co-morbidities.  Patient has been followed for HTN, Prediabetes, Hyperlipidemia and Vitamin D Deficiency.     HTN predates since 2007. Patient's BP has been controlled at home.  Today's BP is at goal - 118/84. Patient denies any cardiac symptoms as chest pain, palpitations, shortness of breath, dizziness or ankle swelling.     Patient's hyperlipidemia is controlled with diet and medications. Patient denies myalgias or other medication SE's. Last lipids were not at goal with elevated Trig's: Lab Results  Component Value Date   CHOL 169 01/01/2017   HDL 26 (L) 01/01/2017   LDLCALC NOT CALC 01/01/2017   TRIG 588 (H) 01/01/2017   CHOLHDL 6.5 (H) 01/01/2017      Patient has Morbid Obesity (BMI 32+) and prediabetes since 2015 with A1c 5.7%  and patient denies reactive hypoglycemic symptoms, visual blurring, diabetic polys or paresthesias. Last A1c was at goal: Lab Results  Component Value Date   HGBA1C 4.9 08/09/2016       Finally, patient has history of Vitamin D Deficiency ("13" in 2008)  and last vitamin D was at goal: Lab Results  Component Value Date   VD25OH 65 08/09/2016   Current Outpatient Prescriptions on File Prior to Visit  Medication Sig  . atorvastatin  80 MG t TAKE 1 TAB DAILY - takes 1/2 tab qod  . lisinopril  40 MG  Take  daily.   Marland Kitchen testosterone cypion 200 MG injec INJECT 2 CC IM EVERY 2 WEEKS   No Known Allergies   Past Medical History:  Diagnosis Date  . Hyperlipidemia    . Hypertension   . Hypogonadism male   . Obesity   . Prediabetes   . Vitamin D deficiency    Health Maintenance  Topic Date Due  . INFLUENZA VACCINE  01/24/2017  . TETANUS/TDAP  12/04/2021  . HIV Screening  Completed   Immunization History  Administered Date(s) Administered  . Influenza Split 03/31/2014, 04/14/2015  . PPD Test 12/24/2013, 12/29/2014, 01/25/2016, 04/16/2017  . Pneumococcal-Unspecified 12/05/2011  . Tdap 12/05/2011   Past Surgical History:  Procedure Laterality Date  . CERVICAL FUSION     Family History  Problem Relation Age of Onset  . Hyperlipidemia Mother   . Hyperlipidemia Father   . Cancer Paternal Grandfather        Lung   Social History   Social History  . Marital status: Married    Spouse name: N/A  . Number of children: N/A  . Years of education: N/A   Occupational History  . Not on file.   Social History Main Topics  . Smoking status: Current Every Day Smoker    Types: Cigarettes    Last attempt to quit: 06/21/2015  . Smokeless tobacco: Former Systems developer    Quit date: 11/25/2014  . Alcohol use 2.4 oz/week    4 Standard drinks or equivalent per week     Comment: moderate  .  Drug use: Yes    Frequency: 2.0 times per week    Types: Marijuana  . Sexual activity: Not on file   Other Topics Concern  . Not on file   Social History Narrative  . No narrative on file    ROS Constitutional: Denies fever, chills, weight loss/gain, headaches, insomnia,  night sweats or change in appetite. Does c/o fatigue. Eyes: Denies redness, blurred vision, diplopia, discharge, itchy or watery eyes.  ENT: Denies discharge, congestion, post nasal drip, epistaxis, sore throat, earache, hearing loss, dental pain, Tinnitus, Vertigo, Sinus pain or snoring.  Cardio: Denies chest pain, palpitations, irregular heartbeat, syncope, dyspnea, diaphoresis, orthopnea, PND, claudication or edema Respiratory: denies cough, dyspnea, DOE, pleurisy, hoarseness, laryngitis or  wheezing.  Gastrointestinal: Denies dysphagia, heartburn, reflux, water brash, pain, cramps, nausea, vomiting, bloating, diarrhea, constipation, hematemesis, melena, hematochezia, jaundice or hemorrhoids Genitourinary: Denies dysuria, frequency, urgency, nocturia, hesitancy, discharge, hematuria or flank pain Musculoskeletal: Denies arthralgia, myalgia, stiffness, Jt. Swelling, pain, limp or strain/sprain. Denies Falls. Skin: Denies puritis, rash, hives, warts, acne, eczema or change in skin lesion Neuro: No weakness, tremor, incoordination, spasms, paresthesia or pain Psychiatric: Denies confusion, memory loss or sensory loss. Denies Depression. Endocrine: Denies change in weight, skin, hair change, nocturia, and paresthesia, diabetic polys, visual blurring or hyper / hypo glycemic episodes.  Heme/Lymph: No excessive bleeding, bruising or enlarged lymph nodes.  Physical Exam  BP 118/84   Pulse 72   Temp (!) 97.3 F (36.3 C)   Resp 18   Ht 5\' 9"  (1.753 m)   Wt 216 lb (98 kg)   BMI 31.90 kg/m   General Appearance: Well nourished and well groomed and in no apparent distress.  Eyes: PERRLA, EOMs, conjunctiva no swelling or erythema, normal fundi and vessels. Sinuses: No frontal/maxillary tenderness ENT/Mouth: EACs patent / TMs  nl. Nares clear without erythema, swelling, mucoid exudates. Oral hygiene is good. No erythema, swelling, or exudate. Tongue normal, non-obstructing. Tonsils not swollen or erythematous. Hearing normal.  Neck: Supple, thyroid normal. No bruits, nodes or JVD. Respiratory: Respiratory effort normal.  BS equal and clear bilateral without rales, rhonci, wheezing or stridor. Cardio: Heart sounds are normal with regular rate and rhythm and no murmurs, rubs or gallops. Peripheral pulses are normal and equal bilaterally without edema. No aortic or femoral bruits. Chest: symmetric with normal excursions and percussion.  Abdomen: Soft, with Nl bowel sounds. Nontender, no  guarding, rebound, hernias, masses, or organomegaly.  Lymphatics: Non tender without lymphadenopathy.  Genitourinary: No hernias.Testes nl. DRE - prostate nl for age - smooth & firm w/o nodules. Musculoskeletal: Full ROM all peripheral extremities, joint stability, 5/5 strength, and normal gait. Skin: Warm and dry without rashes, lesions, cyanosis, clubbing or  ecchymosis.  Neuro: Cranial nerves intact, reflexes equal bilaterally. Normal muscle tone, no cerebellar symptoms. Sensation intact.  Pysch: Alert and oriented X 3 with normal affect, insight and judgment appropriate.   Assessment and Plan  1. Annual Preventative/Screening Exam   2. Essential hypertension  - EKG 12-Lead - Urinalysis, Routine w reflex microscopic - Microalbumin / creatinine urine ratio - CBC with Differential/Platelet - BASIC METABOLIC PANEL WITH GFR - Magnesium - TSH  3. Hyperlipidemia, mixed  - EKG 12-Lead - Hepatic function panel - Lipid panel - TSH  4. Prediabetes  - EKG 12-Lead - Hemoglobin A1c - Insulin, random  5. Vitamin D deficiency  - VITAMIN D 25 Hydroxy   6. Testosterone Deficiency  - Testosterone  7. Screening for colorectal cancer  - POC  Hemoccult Bld/Stl   8. Prostate cancer screening  - PSA  9. Screening for ischemic heart disease  - EKG 12-Lead  10. Screening examination for pulmonary tuberculosis  - Iron,Total/Total Iron Binding Cap - Vitamin B12 - Testosterone - PPD  11. Medication management  - Urinalysis, Routine w reflex microscopic - Microalbumin / creatinine urine ratio - Testosterone - CBC with Differential/Platelet - BASIC METABOLIC PANEL WITH GFR - Hepatic function panel - Magnesium - Lipid panel - TSH - Hemoglobin A1c - Insulin, random - VITAMIN D 25 Hydroxy         Patient was counseled in prudent diet, weight control to achieve/maintain BMI less than 25, BP monitoring, regular exercise and medications as discussed.  Discussed med effects  and SE's. Routine screening labs and tests as requested with regular follow-up as recommended. Over 40 minutes of exam, counseling, chart review and high complex critical decision making was performed

## 2017-04-17 LAB — URINALYSIS, ROUTINE W REFLEX MICROSCOPIC
Bilirubin Urine: NEGATIVE
GLUCOSE, UA: NEGATIVE
Hgb urine dipstick: NEGATIVE
Ketones, ur: NEGATIVE
LEUKOCYTES UA: NEGATIVE
Nitrite: NEGATIVE
PROTEIN: NEGATIVE
Specific Gravity, Urine: 1.006 (ref 1.001–1.03)
pH: 7 (ref 5.0–8.0)

## 2017-04-17 LAB — CBC WITH DIFFERENTIAL/PLATELET
BASOS ABS: 66 {cells}/uL (ref 0–200)
Basophils Relative: 0.7 %
EOS PCT: 1.3 %
Eosinophils Absolute: 122 cells/uL (ref 15–500)
HCT: 55.8 % — ABNORMAL HIGH (ref 38.5–50.0)
HEMOGLOBIN: 19.3 g/dL — AB (ref 13.2–17.1)
LYMPHS ABS: 1805 {cells}/uL (ref 850–3900)
MCH: 30.4 pg (ref 27.0–33.0)
MCHC: 34.6 g/dL (ref 32.0–36.0)
MCV: 88 fL (ref 80.0–100.0)
MPV: 11.8 fL (ref 7.5–12.5)
Monocytes Relative: 8.4 %
NEUTROS ABS: 6618 {cells}/uL (ref 1500–7800)
Neutrophils Relative %: 70.4 %
Platelets: 278 10*3/uL (ref 140–400)
RBC: 6.34 10*6/uL — ABNORMAL HIGH (ref 4.20–5.80)
RDW: 12.7 % (ref 11.0–15.0)
Total Lymphocyte: 19.2 %
WBC: 9.4 10*3/uL (ref 3.8–10.8)
WBCMIX: 790 {cells}/uL (ref 200–950)

## 2017-04-17 LAB — LIPID PANEL
CHOLESTEROL: 178 mg/dL (ref <200)
HDL: 36 mg/dL — ABNORMAL LOW (ref >40)
LDL Cholesterol (Calc): 107 mg/dL (calc) — ABNORMAL HIGH
Non-HDL Cholesterol (Calc): 142 mg/dL (calc) — ABNORMAL HIGH (ref <130)
Total CHOL/HDL Ratio: 4.9 (calc) (ref <5.0)
Triglycerides: 230 mg/dL — ABNORMAL HIGH (ref <150)

## 2017-04-17 LAB — IRON, TOTAL/TOTAL IRON BINDING CAP
%SAT: 23 % (calc) (ref 15–60)
IRON: 80 ug/dL (ref 50–180)
TIBC: 352 ug/dL (ref 250–425)

## 2017-04-17 LAB — BASIC METABOLIC PANEL WITH GFR
BUN: 12 mg/dL (ref 7–25)
CALCIUM: 10.4 mg/dL — AB (ref 8.6–10.3)
CO2: 30 mmol/L (ref 20–32)
Chloride: 101 mmol/L (ref 98–110)
Creat: 1.1 mg/dL (ref 0.60–1.35)
GFR, EST NON AFRICAN AMERICAN: 82 mL/min/{1.73_m2} (ref >=60)
GFR, Est African American: 95 mL/min/{1.73_m2} (ref >=60)
Glucose, Bld: 76 mg/dL (ref 65–99)
Potassium: 4.3 mmol/L (ref 3.5–5.3)
SODIUM: 141 mmol/L (ref 135–146)

## 2017-04-17 LAB — INSULIN, RANDOM: INSULIN: 8.3 u[IU]/mL (ref 2.0–19.6)

## 2017-04-17 LAB — HEPATIC FUNCTION PANEL
AG Ratio: 2 (calc) (ref 1.0–2.5)
ALBUMIN MSPROF: 4.9 g/dL (ref 3.6–5.1)
ALT: 32 U/L (ref 9–46)
AST: 24 U/L (ref 10–40)
Alkaline phosphatase (APISO): 79 U/L (ref 40–115)
BILIRUBIN TOTAL: 0.7 mg/dL (ref 0.2–1.2)
Bilirubin, Direct: 0.1 mg/dL (ref 0.0–0.2)
Globulin: 2.5 g/dL (calc) (ref 1.9–3.7)
Indirect Bilirubin: 0.6 mg/dL (calc) (ref 0.2–1.2)
Total Protein: 7.4 g/dL (ref 6.1–8.1)

## 2017-04-17 LAB — VITAMIN B12: Vitamin B-12: 499 pg/mL (ref 200–1100)

## 2017-04-17 LAB — HEMOGLOBIN A1C
EAG (MMOL/L): 5.5 (calc)
HEMOGLOBIN A1C: 5.1 %{Hb} (ref <5.7)
MEAN PLASMA GLUCOSE: 100 (calc)

## 2017-04-17 LAB — MAGNESIUM: MAGNESIUM: 2.1 mg/dL (ref 1.5–2.5)

## 2017-04-17 LAB — TSH: TSH: 0.94 m[IU]/L (ref 0.40–4.50)

## 2017-04-17 LAB — MICROALBUMIN / CREATININE URINE RATIO: CREATININE, URINE: 35 mg/dL (ref 20–320)

## 2017-04-17 LAB — VITAMIN D 25 HYDROXY (VIT D DEFICIENCY, FRACTURES): VIT D 25 HYDROXY: 70 ng/mL (ref 30–100)

## 2017-04-17 LAB — TESTOSTERONE: TESTOSTERONE: 1066 ng/dL — AB (ref 250–827)

## 2017-04-17 LAB — PSA: PSA: 1.2 ng/mL (ref <=4.0)

## 2017-05-08 ENCOUNTER — Encounter: Payer: Self-pay | Admitting: Internal Medicine

## 2017-05-08 ENCOUNTER — Ambulatory Visit: Payer: 59 | Admitting: Internal Medicine

## 2017-05-08 VITALS — BP 116/78 | HR 72 | Temp 97.3°F | Resp 16 | Ht 69.0 in | Wt 216.2 lb

## 2017-05-08 DIAGNOSIS — I10 Essential (primary) hypertension: Secondary | ICD-10-CM | POA: Diagnosis not present

## 2017-05-08 DIAGNOSIS — L723 Sebaceous cyst: Secondary | ICD-10-CM

## 2017-05-10 IMAGING — DX DG KNEE COMPLETE 4+V*R*
5 series · 5 of 5 positions shown · non-contrast
Comparison: None.

CLINICAL DATA: Right knee pain for 2 days after injury.

EXAM:
RIGHT KNEE - COMPLETE 4+ VIEW

[knee ap]
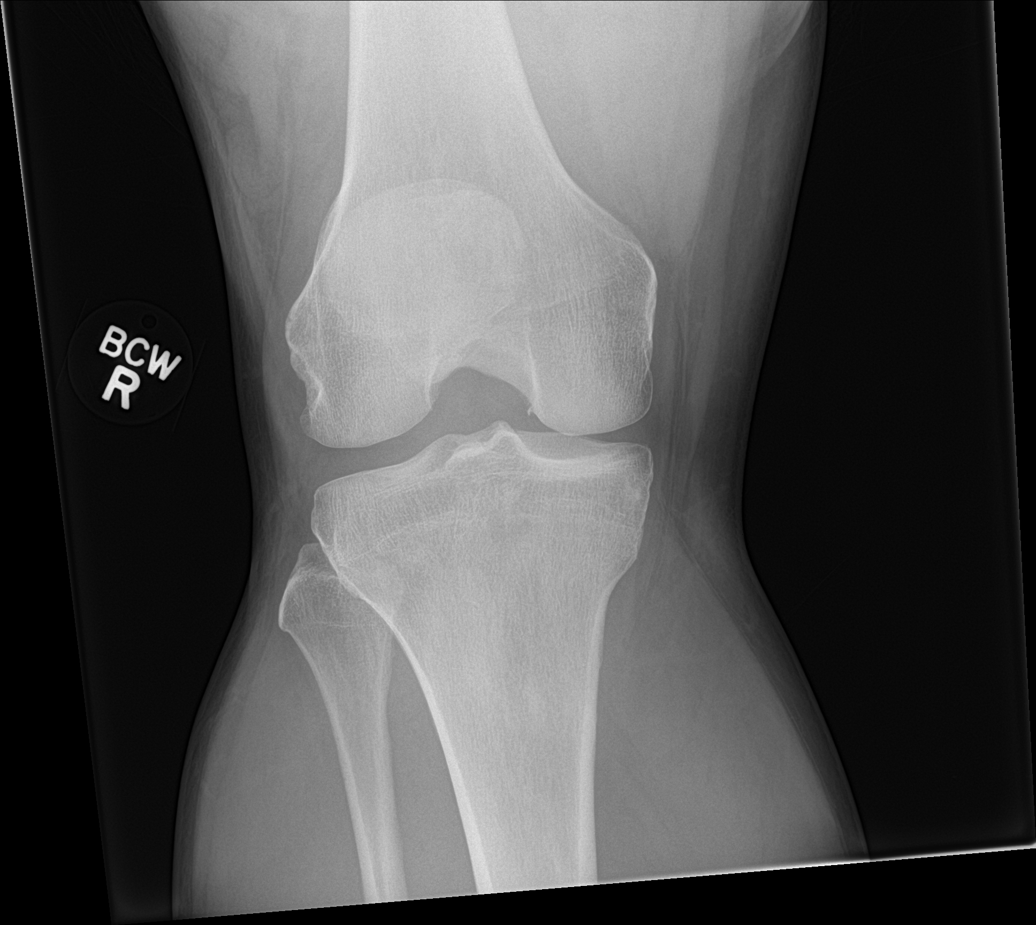

[knee lat (1 of 2)]
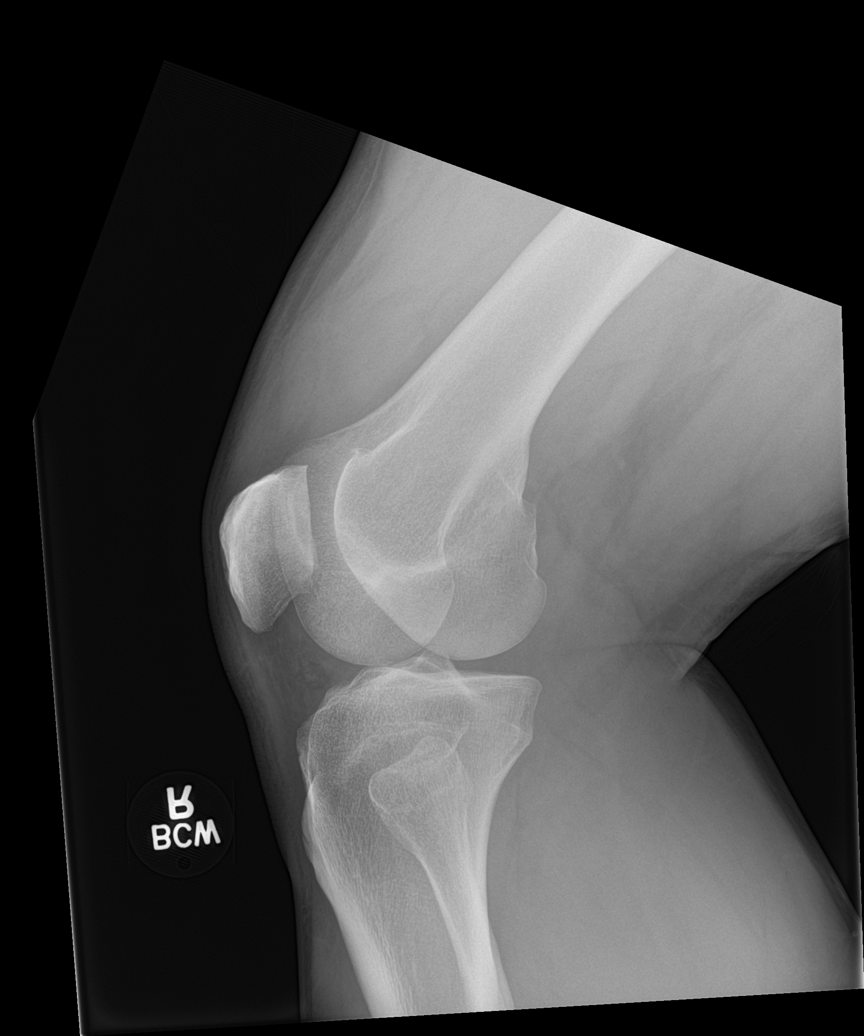

[knee obl (1 of 2)]
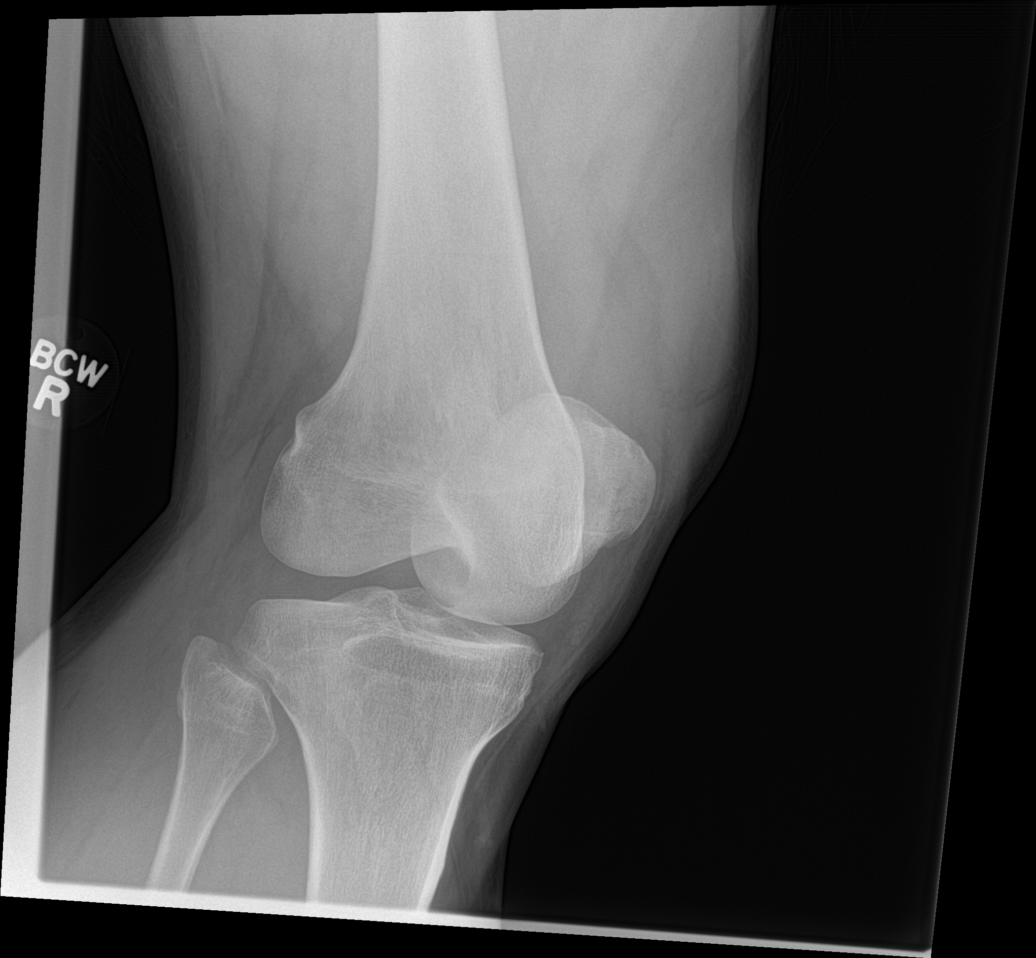

[knee obl (2 of 2)]
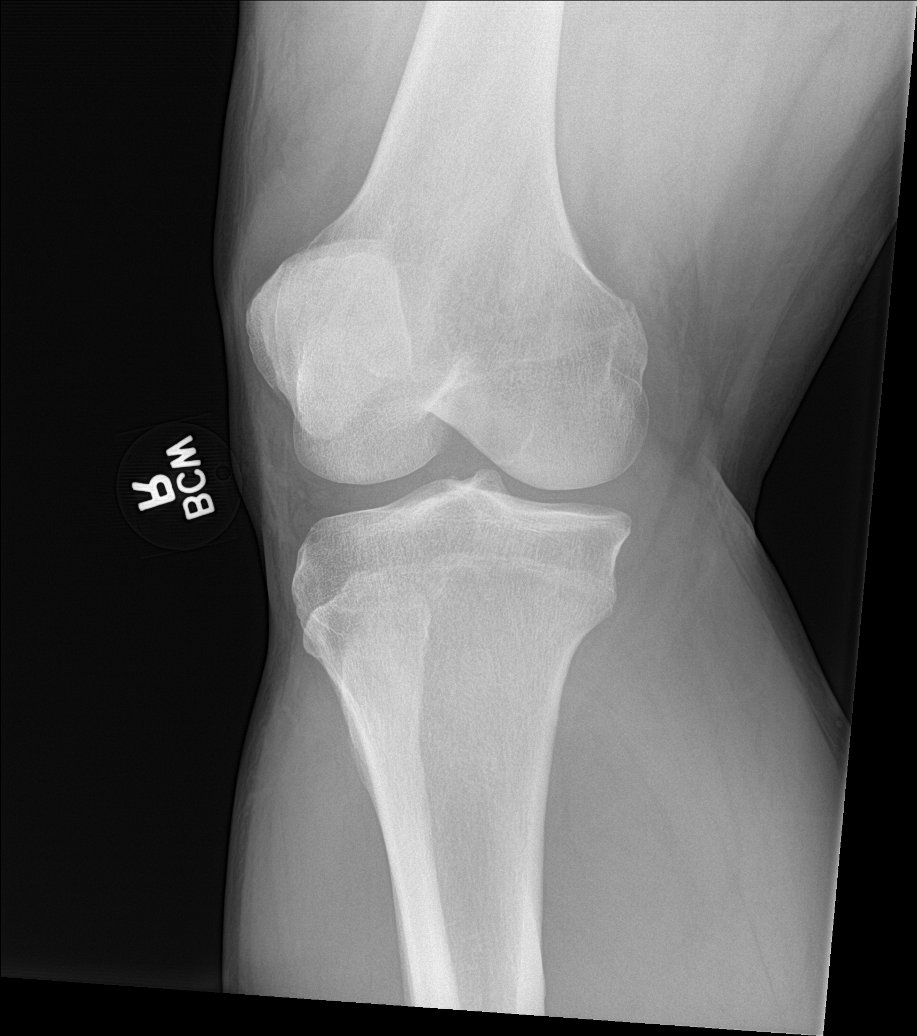

[knee lat (2 of 2)]
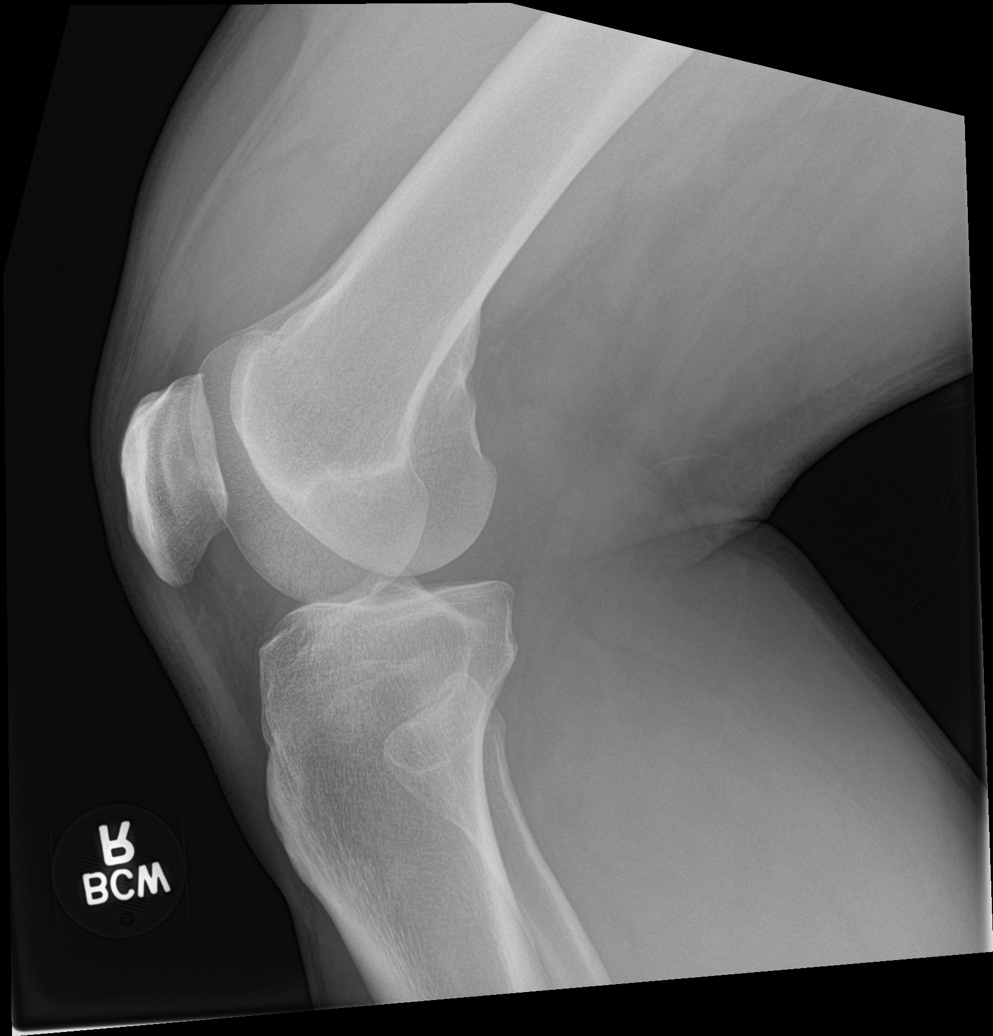

[5 of 5 positions shown; findings below may reference images not displayed]

FINDINGS: Small to moderate suprapatellar right knee joint effusion. No
fracture, dislocation, suspicious focal osseous lesion or
appreciable arthropathy. No radiopaque foreign body.
IMPRESSION: Small to moderate suprapatellar right knee joint effusion. No
fracture or malalignment.

## 2017-05-11 NOTE — Progress Notes (Signed)
  Subjective:    Patient ID: Neil Henderson, male    DOB: 1975-08-08, 41 y.o.   MRN: [id number]  HPI  This very nice 41 yo MWM with HTN, HLD, PreDM presents for recheck and BP is normal at 116/78. He denies HA's, dizziness, CP, palpitations, dyspnea or edema. He also has concerns about a tender lump of his posterior R shoulder that he feels has recently been increasing in size.   Medication Sig  . atorvastatin80 MG tablet TAKE 1 TABL DAILY   . lisinopril  40 MG  Take daily.   Marland Kitchen testosterone cypio 200 MG INJECT 2 CC IM EVERY 2 WEEKS   No Known Allergies   Past Medical History:  Diagnosis Date  . Hyperlipidemia   . Hypertension   . Hypogonadism male   . Obesity   . Prediabetes   . Vitamin D deficiency    Past Surgical History:  Procedure Laterality Date  . CERVICAL FUSION     Review of Systems  10 point systems review negative except as above.    Objective:   Physical Exam   BP 116/78   Pulse 72   Temp (!) 97.3 F (36.3 C)   Resp 16   Ht 5\' 9"  (1.753 m)   Wt 216 lb 3.2 oz (98.1 kg)   SpO2 96%   BMI 31.93 kg/m   HEENT - WNL. Neck - supple.  Chest - Clear equal BS. Cor - Nl HS. RRR w/o sig MGR. PP 1(+). No edema. MS- FROM w/o deformities.  Gait Nl. Neuro -  Nl w/o focal abnormalities. Skin - There is a 2 cm mobile tender subcut firm mass of the posterior R shoulder.   After informed consent and aseptic prep the area in question is anesthetized subcutaneously  in a 4 cm diameter circumstance with 2 ml of Marcaine 0.5% .   Procedure (CPT [id number])    With a #10 scalpel, a transverse incision was made over the mass which was sharply dissected free and delivered and appears to be a sebaceous cyst.  Then the wound cavity was debrided of detritus and scar tissue. Then the cavity was irrigated with H2O2 followed by a rinse with betadine solution. Then the skin edges were approximated and aligned and collapsed w/ everting opposing skin edges with # 4 wide vertical mattress  sutures with Nylon 3-0. Next  the edges were closely aligned with # 5 more interrupted sutures of Nylon 3-0. Finally, a sterile 4" x 6" Tegaderm dressing was applied and patient was instructed in po wound care.     Assessment & Plan:   1. Essential hypertension   2. Inflamed sebaceous cyst  - excised

## 2017-05-22 ENCOUNTER — Ambulatory Visit: Payer: 59 | Admitting: Internal Medicine

## 2017-05-22 VITALS — BP 128/74 | HR 76 | Temp 97.7°F | Resp 18 | Ht 69.0 in | Wt 213.0 lb

## 2017-05-22 DIAGNOSIS — L723 Sebaceous cyst: Secondary | ICD-10-CM

## 2017-05-27 ENCOUNTER — Encounter: Payer: Self-pay | Admitting: Internal Medicine

## 2017-05-27 NOTE — Progress Notes (Signed)
      Patient returns s/p excision of an inflamed Sebaceous Cyst Posterior Rt Shoulder       Wound clean w/o sign of infection w/ good healing ridge. Suture removed.

## 2017-06-06 ENCOUNTER — Other Ambulatory Visit: Payer: Self-pay | Admitting: Physician Assistant

## 2017-07-22 NOTE — Progress Notes (Signed)
FOLLOW UP  Assessment and Plan:   Hypertension Well controlled with current medications  Monitor blood pressure at home; patient to call if consistently greater than 130/80 Continue DASH diet.   Reminder to go to the ER if any CP, SOB, nausea, dizziness, severe HA, changes vision/speech, left arm numbness and tingling and jaw pain.  Cholesterol Currently above goal on 40 mg atorvastatin every other day - no myalgias in the past with statins - increase to 40 mg daily; emphasized necessity for adherence to lifestyle  Continue low cholesterol diet and exercise.  Check lipid panel.   Other abnormal glucose Recent A1Cs normalized Continue diet and exercise.  Perform daily foot/skin check, notify office of any concerning changes.  Defer A1C; check BMP  Obesity with co morbidities Long discussion about weight loss, diet, and exercise Recommended diet heavy in fruits and veggies and low in animal meats, cheeses, and dairy products, appropriate calorie intake Discussed ideal weight for height (below 180 lb) and initial weight goal (200 lb) Patient will work on continuing exercise, work on portion control  Will follow up in 3 months  Vitamin D Def At goal at last visit; continue supplementation to maintain goal of 70-100 Defer Vit D level  Testosterone deficiency Continue to monitor, states medication is helping with symptoms of low T.   Cough + congestion, just started Promethazine -DM cough syrup prescribed Discussed immune support, good hygiene Monitor for fever, very productive cough- if symptoms worsening or continued without improving in 7 days may fill zpak prescription  Continue diet and meds as discussed. Further disposition pending results of labs. Discussed med's effects and SE's.   Over 30 minutes of exam, counseling, chart review, and critical decision making was performed.   Future Appointments  Date Time Provider [place]  10/22/2017  4:30 PM Unk Pinto, MD [facility] None  05/28/2018  3:00 PM Unk Pinto, MD [facility] None    ----------------------------------------------------------------------------------------------------------------------  HPI 41 y.o. male  presents for 3 month follow up on hypertension, cholesterol, glucose management (hx of prediabetes), obesity, testosterone deficiency and vitamin D deficiency. Patient also complains  of symptoms of a URI. Symptoms include congestion and non productive cough. Onset of symptoms was 1 day ago, and has been rapidly worsening since that time. Treatment to date: none.  BMI is Body mass index is 31.01 kg/m., he has been working on exercise.  Wt Readings from Last 3 Encounters:  07/23/17 210 lb (95.3 kg)  05/22/17 213 lb (96.6 kg)  05/08/17 216 lb 3.2 oz (98.1 kg)   He is not currently checking BPs at home, today their BP is BP: 134/80  He does workout. He denies chest pain, shortness of breath, dizziness.   He is on cholesterol medication and denies myalgias. His cholesterol is not at goal. The cholesterol last visit was:   Lab Results  Component Value Date   CHOL 178 04/16/2017   HDL 36 (L) 04/16/2017   LDLCALC NOT CALC 01/01/2017   TRIG 230 (H) 04/16/2017   CHOLHDL 4.9 04/16/2017    He has been working on diet and exercise for glucose management (hx of prediabetes), and denies increased appetite, nausea, paresthesia of the feet, polydipsia, polyuria, visual disturbances and vomiting. Last A1C in the office was:  Lab Results  Component Value Date   HGBA1C 5.1 04/16/2017   Patient is on Vitamin D supplement and at goal at recent check:    Lab Results  Component Value Date   VD25OH 70 04/16/2017  He has a history of testosterone deficiency and is on testosterone replacement. He states that the testosterone helps with his energy, libido, muscle mass. Lab Results  Component Value Date   TESTOSTERONE 1,066 (H) 04/16/2017      Current Medications:  Current  Outpatient Medications on File Prior to Visit  Medication Sig  . atorvastatin (LIPITOR) 80 MG tablet TAKE 1 TABLET DAILY OR AS DIRECTED FOR CHOLESTEROL  . lisinopril (PRINIVIL,ZESTRIL) 40 MG tablet Take 40 mg by mouth daily.   Marland Kitchen lisinopril (PRINIVIL,ZESTRIL) 40 MG tablet TAKE 1 TABLET BY MOUTH DAILY.  Marland Kitchen testosterone cypionate (DEPOTESTOSTERONE CYPIONATE) 200 MG/ML injection INJECT 2 CC INTRAMUSCULAR EVERY 2 WEEKS   No current facility-administered medications on file prior to visit.      Allergies: No Known Allergies   Medical History:  Past Medical History:  Diagnosis Date  . Hyperlipidemia   . Hypertension   . Hypogonadism male   . Obesity   . Prediabetes   . Vitamin D deficiency    Family history- Reviewed and unchanged Social history- Reviewed and unchanged   Review of Systems:  Review of Systems  Constitutional: Negative for chills, diaphoresis, fever, malaise/fatigue and weight loss.  HENT: Positive for congestion. Negative for ear discharge, ear pain, hearing loss, sinus pain, sore throat and tinnitus.   Eyes: Negative for blurred vision and double vision.  Respiratory: Positive for cough. Negative for sputum production, shortness of breath and wheezing.   Cardiovascular: Negative for chest pain, palpitations, orthopnea, claudication and leg swelling.  Gastrointestinal: Negative for abdominal pain, blood in stool, constipation, diarrhea, heartburn, melena, nausea and vomiting.  Genitourinary: Negative.   Musculoskeletal: Negative for joint pain and myalgias.  Skin: Negative for rash.  Neurological: Negative for dizziness, tingling, sensory change, weakness and headaches.  Endo/Heme/Allergies: Negative for environmental allergies and polydipsia.  Psychiatric/Behavioral: Negative.   All other systems reviewed and are negative.     Physical Exam: BP 134/80   Pulse 94   Temp 97.7 F (36.5 C)   Ht 5\' 9"  (1.753 m)   Wt 210 lb (95.3 kg)   SpO2 96%   BMI 31.01  kg/m  Wt Readings from Last 3 Encounters:  07/23/17 210 lb (95.3 kg)  05/22/17 213 lb (96.6 kg)  05/08/17 216 lb 3.2 oz (98.1 kg)   General Appearance: Well nourished, in no apparent distress. Eyes: PERRLA, EOMs, conjunctiva no swelling or erythema Sinuses: No Frontal/maxillary tenderness ENT/Mouth: Ext aud canals clear, TMs without erythema, bulging. No erythema, swelling, or exudate on post pharynx.  Tonsils not swollen or erythematous. Hearing normal.  Neck: Supple, thyroid normal.  Respiratory: Respiratory effort normal, BS equal bilaterally without rales, rhonchi, wheezing or stridor.  Cardio: RRR with no MRGs. Brisk peripheral pulses without edema.  Abdomen: Soft, + BS.  Non tender, no guarding, rebound, hernias, masses. Lymphatics: Non tender without lymphadenopathy.  Musculoskeletal: Full ROM, 5/5 strength, Normal gait Skin: Warm, dry without rashes, lesions, ecchymosis.  Neuro: Cranial nerves intact. No cerebellar symptoms.  Psych: Awake and oriented X 3, normal affect, Insight and Judgment appropriate.    Izora Ribas, NP 4:38 PM [facility]

## 2017-07-23 ENCOUNTER — Ambulatory Visit (INDEPENDENT_AMBULATORY_CARE_PROVIDER_SITE_OTHER): Payer: 59 | Admitting: Adult Health

## 2017-07-23 ENCOUNTER — Encounter: Payer: Self-pay | Admitting: Adult Health

## 2017-07-23 VITALS — BP 134/80 | HR 94 | Temp 97.7°F | Ht 69.0 in | Wt 210.0 lb

## 2017-07-23 DIAGNOSIS — Z79899 Other long term (current) drug therapy: Secondary | ICD-10-CM | POA: Diagnosis not present

## 2017-07-23 DIAGNOSIS — E559 Vitamin D deficiency, unspecified: Secondary | ICD-10-CM

## 2017-07-23 DIAGNOSIS — I10 Essential (primary) hypertension: Secondary | ICD-10-CM | POA: Diagnosis not present

## 2017-07-23 DIAGNOSIS — R7309 Other abnormal glucose: Secondary | ICD-10-CM

## 2017-07-23 DIAGNOSIS — R05 Cough: Secondary | ICD-10-CM

## 2017-07-23 DIAGNOSIS — E66811 Obesity, class 1: Secondary | ICD-10-CM

## 2017-07-23 DIAGNOSIS — E782 Mixed hyperlipidemia: Secondary | ICD-10-CM | POA: Diagnosis not present

## 2017-07-23 DIAGNOSIS — E291 Testicular hypofunction: Secondary | ICD-10-CM

## 2017-07-23 DIAGNOSIS — E669 Obesity, unspecified: Secondary | ICD-10-CM

## 2017-07-23 DIAGNOSIS — R059 Cough, unspecified: Secondary | ICD-10-CM

## 2017-07-23 MED ORDER — ATORVASTATIN CALCIUM 80 MG PO TABS: 40.0000 mg | ORAL_TABLET | ORAL | 1 refills | Status: DC

## 2017-07-23 MED ORDER — AZITHROMYCIN 250 MG PO TABS: ORAL_TABLET | ORAL | 1 refills | Status: AC

## 2017-07-23 MED ORDER — PROMETHAZINE-DM 6.25-15 MG/5ML PO SYRP: 5.0000 mL | ORAL_SOLUTION | Freq: Four times a day (QID) | ORAL | 1 refills | Status: DC | PRN

## 2017-07-23 MED ORDER — ATORVASTATIN CALCIUM 80 MG PO TABS: 40.0000 mg | ORAL_TABLET | Freq: Every day | ORAL | 1 refills | Status: DC

## 2017-07-23 NOTE — Patient Instructions (Addendum)
Increase atorvastatin to 40 mg daily -   Consider adding a daily fiber supplement such as citrucel or benefiber - fiber can bind to cholesterol and bad carbs and slow down absorption   Fat and Cholesterol Restricted Diet Getting too much fat and cholesterol in your diet may cause health problems. Following this diet helps keep your fat and cholesterol at normal levels. This can keep you from getting sick. What types of fat should I choose?  Choose monosaturated and polyunsaturated fats. These are found in foods such as olive oil, canola oil, flaxseeds, walnuts, almonds, and seeds.  Eat more omega-3 fats. Good choices include salmon, mackerel, sardines, tuna, flaxseed oil, and ground flaxseeds.  Limit saturated fats. These are in animal products such as meats, butter, and cream. They can also be in plant products such as palm oil, palm kernel oil, and coconut oil.  Avoid foods with partially hydrogenated oils in them. These contain trans fats. Examples of foods that have trans fats are stick margarine, some tub margarines, cookies, crackers, and other baked goods. What general guidelines do I need to follow?  Check food labels. Look for the words "trans fat" and "saturated fat."  When preparing a meal: ? Fill half of your plate with vegetables and green salads. ? Fill one fourth of your plate with whole grains. Look for the word "whole" as the first word in the ingredient list. ? Fill one fourth of your plate with lean protein foods.  Eat more foods that have fiber, like apples, carrots, beans, peas, and barley.  Eat more home-cooked foods. Eat less at restaurants and buffets.  Limit or avoid alcohol.  Limit foods high in starch and sugar.  Limit fried foods.  Cook foods without frying them. Baking, boiling, grilling, and broiling are all great options.  Lose weight if you are overweight. Losing even a small amount of weight can help your overall health. It can also help prevent  diseases such as diabetes and heart disease. What foods can I eat? Grains Whole grains, such as whole wheat or whole grain breads, crackers, cereals, and pasta. Unsweetened oatmeal, bulgur, barley, quinoa, or brown rice. Corn or whole wheat flour tortillas. Vegetables Fresh or frozen vegetables (raw, steamed, roasted, or grilled). Green salads. Fruits All fresh, canned (in natural juice), or frozen fruits. Meat and Other Protein Products Ground beef (85% or leaner), grass-fed beef, or beef trimmed of fat. Skinless chicken or [place]. Ground chicken or [place]. Pork trimmed of fat. All fish and seafood. Eggs. Dried beans, peas, or lentils. Unsalted nuts or seeds. Unsalted canned or dry beans. Dairy Low-fat dairy products, such as skim or 1% milk, 2% or reduced-fat cheeses, low-fat ricotta or cottage cheese, or plain low-fat yogurt. Fats and Oils Tub margarines without trans fats. Light or reduced-fat mayonnaise and salad dressings. Avocado. Olive, canola, sesame, or safflower oils. Natural peanut or almond butter (choose ones without added sugar and oil). The items listed above may not be a complete list of recommended foods or beverages. Contact your dietitian for more options. What foods are not recommended? Grains White bread. White pasta. White rice. Cornbread. Bagels, pastries, and croissants. Crackers that contain trans fat. Vegetables White potatoes. Corn. Creamed or fried vegetables. Vegetables in a cheese sauce. Fruits Dried fruits. Canned fruit in light or heavy syrup. Fruit juice. Meat and Other Protein Products Fatty cuts of meat. Ribs, chicken wings, bacon, sausage, bologna, salami, chitterlings, fatback, hot dogs, bratwurst, and packaged luncheon meats. Liver and organ meats.  Dairy Whole or 2% milk, cream, half-and-half, and cream cheese. Whole milk cheeses. Whole-fat or sweetened yogurt. Full-fat cheeses. Nondairy creamers and whipped toppings. Processed cheese, cheese spreads,  or cheese curds. Sweets and Desserts Corn syrup, sugars, honey, and molasses. Candy. Jam and jelly. Syrup. Sweetened cereals. Cookies, pies, cakes, donuts, muffins, and ice cream. Fats and Oils Butter, stick margarine, lard, shortening, ghee, or bacon fat. Coconut, palm kernel, or palm oils. Beverages Alcohol. Sweetened drinks (such as sodas, lemonade, and fruit drinks or punches). The items listed above may not be a complete list of foods and beverages to avoid. Contact your dietitian for more information. This information is not intended to replace advice given to you by your health care provider. Make sure you discuss any questions you have with your health care provider. Document Released: 12/12/2011 Document Revised: 02/17/2016 Document Reviewed: 09/11/2013 Elsevier Interactive Patient Education  [employer].

## 2017-07-24 LAB — CBC WITH DIFFERENTIAL/PLATELET
Basophils Absolute: 41 cells/uL (ref 0–200)
Basophils Relative: 0.6 %
Eosinophils Absolute: 62 cells/uL (ref 15–500)
Eosinophils Relative: 0.9 %
HCT: 52.5 % — ABNORMAL HIGH (ref 38.5–50.0)
Hemoglobin: 17.8 g/dL — ABNORMAL HIGH (ref 13.2–17.1)
Lymphs Abs: 1049 cells/uL (ref 850–3900)
MCH: 30.2 pg (ref 27.0–33.0)
MCHC: 33.9 g/dL (ref 32.0–36.0)
MCV: 89.1 fL (ref 80.0–100.0)
MONOS PCT: 15.1 %
MPV: 12 fL (ref 7.5–12.5)
Neutro Abs: 4706 cells/uL (ref 1500–7800)
Neutrophils Relative %: 68.2 %
PLATELETS: 248 10*3/uL (ref 140–400)
RBC: 5.89 10*6/uL — AB (ref 4.20–5.80)
RDW: 12.5 % (ref 11.0–15.0)
TOTAL LYMPHOCYTE: 15.2 %
WBC mixed population: 1042 cells/uL — ABNORMAL HIGH (ref 200–950)
WBC: 6.9 10*3/uL (ref 3.8–10.8)

## 2017-07-24 LAB — LIPID PANEL
CHOLESTEROL: 164 mg/dL (ref <200)
HDL: 35 mg/dL — AB (ref >40)
LDL Cholesterol (Calc): 101 mg/dL (calc) — ABNORMAL HIGH
Non-HDL Cholesterol (Calc): 129 mg/dL (calc) (ref <130)
Total CHOL/HDL Ratio: 4.7 (calc) (ref <5.0)
Triglycerides: 185 mg/dL — ABNORMAL HIGH (ref <150)

## 2017-07-24 LAB — HEPATIC FUNCTION PANEL
AG Ratio: 1.7 (calc) (ref 1.0–2.5)
ALBUMIN MSPROF: 4.8 g/dL (ref 3.6–5.1)
ALT: 31 U/L (ref 9–46)
AST: 24 U/L (ref 10–40)
Alkaline phosphatase (APISO): 82 U/L (ref 40–115)
BILIRUBIN DIRECT: 0.2 mg/dL (ref 0.0–0.2)
BILIRUBIN INDIRECT: 0.6 mg/dL (ref 0.2–1.2)
Globulin: 2.8 g/dL (calc) (ref 1.9–3.7)
TOTAL PROTEIN: 7.6 g/dL (ref 6.1–8.1)
Total Bilirubin: 0.8 mg/dL (ref 0.2–1.2)

## 2017-07-24 LAB — BASIC METABOLIC PANEL WITH GFR
BUN: 16 mg/dL (ref 7–25)
CO2: 26 mmol/L (ref 20–32)
CREATININE: 1 mg/dL (ref 0.60–1.35)
Calcium: 9.9 mg/dL (ref 8.6–10.3)
Chloride: 104 mmol/L (ref 98–110)
GFR, EST NON AFRICAN AMERICAN: 92 mL/min/{1.73_m2} (ref >=60)
GFR, Est African American: 106 mL/min/{1.73_m2} (ref >=60)
Glucose, Bld: 89 mg/dL (ref 65–99)
POTASSIUM: 4.3 mmol/L (ref 3.5–5.3)
SODIUM: 138 mmol/L (ref 135–146)

## 2017-07-24 LAB — TSH: TSH: 1.59 mIU/L (ref 0.40–4.50)

## 2017-09-21 ENCOUNTER — Emergency Department (HOSPITAL_COMMUNITY): Admission: EM | Admit: 2017-09-21 | Discharge: 2017-09-21 | Discharge status: Absent without leave | Payer: 59

## 2017-09-21 ENCOUNTER — Ambulatory Visit (HOSPITAL_COMMUNITY)
Admission: EM | Admit: 2017-09-21 | Discharge: 2017-09-21 | Disposition: A | Discharge status: Home / Self Care | Payer: 59 | Attending: Family Medicine | Admitting: Family Medicine

## 2017-09-21 ENCOUNTER — Encounter (HOSPITAL_COMMUNITY): Payer: Self-pay | Admitting: Emergency Medicine

## 2017-09-21 ENCOUNTER — Other Ambulatory Visit: Payer: Self-pay

## 2017-09-21 DIAGNOSIS — M50321 Other cervical disc degeneration at C4-C5 level: Secondary | ICD-10-CM | POA: Diagnosis not present

## 2017-09-21 DIAGNOSIS — R51 Headache: Secondary | ICD-10-CM

## 2017-09-21 DIAGNOSIS — M542 Cervicalgia: Secondary | ICD-10-CM | POA: Diagnosis not present

## 2017-09-21 DIAGNOSIS — S161XXA Strain of muscle, fascia and tendon at neck level, initial encounter: Secondary | ICD-10-CM | POA: Diagnosis not present

## 2017-09-21 DIAGNOSIS — R519 Headache, unspecified: Secondary | ICD-10-CM

## 2017-09-21 NOTE — ED Triage Notes (Signed)
States while driving last Saturday PM he sneezed and developed a headache that has been constant all week and now has traveled to neck.

## 2017-09-21 NOTE — ED Triage Notes (Signed)
Pt is at [facility] waiting room

## 2017-09-21 NOTE — Discharge Instructions (Signed)
Go to ER now

## 2017-09-21 NOTE — ED Provider Notes (Signed)
  [place]    CSN: [id number] Arrival date & time: 09/21/17  1721   Chief Complaint  Patient presents with  . Headache     Neil Henderson is a 42 y.o. male here for evaluation of an acute headache.  Duration: 6 days, sneezed and had worst headache of life that was very severe for 2 days. Now very severe only when he bears down.  Laterality: bilateral Quality: throbbing Severity: 10/10 when he bears down Associated symptoms: slight neck soreness that is improving Therapies tried: Aleve, Advil Hx of migraines- No.  ROS:  Neuro: +HA MSK: +neck pain  Past Medical History:  Diagnosis Date  . Hyperlipidemia   . Hypertension   . Hypogonadism male   . Obesity   . Prediabetes   . Vitamin D deficiency    Family History  Problem Relation Age of Onset  . Hyperlipidemia Mother   . Hyperlipidemia Father   . Cancer Paternal Grandfather        Lung     Social History   Socioeconomic History  . Marital status: Married   Allergies as of 09/21/2017   No Known Allergies   BP 126/72 (BP Location: Left Arm)   Pulse 84   Temp (!) 97.1 F (36.2 C) (Oral)   SpO2 97%  Gen: awake, alert, appearing stated age Eyes: PERRLA, EOMi, no injection Heart: RRR Lungs: CTAB, no accessory muscle use Abd: BS+, soft, NT, ND Neuro: CN2-12 grossly intact, fluent and goal-oriented speech, DTR's equal and symmetric in UE's and LE's MSK: 5/5 strength throughout, no TTP over cervical paraspinal musculature or occipital triangle region Psych: Age appropriate judgment and insight, normal affect and mood  Nonintractable headache, unspecified chronicity pattern, unspecified headache type  I am concerned about an intracranial issue, he is on ASA and describes worst headache of life that worsens with valsalva. I instructed him to go to ER.  The pt voiced understanding and agreement to the plan.    Shelda Pal, [place] 09/21/17 1936

## 2017-09-22 ENCOUNTER — Emergency Department (HOSPITAL_COMMUNITY): Admission: EM | Admit: 2017-09-22 | Discharge: 2017-09-22 | Discharge status: Against Medical Advice | Payer: 59

## 2017-09-22 DIAGNOSIS — M542 Cervicalgia: Secondary | ICD-10-CM | POA: Diagnosis not present

## 2017-09-22 DIAGNOSIS — R51 Headache: Secondary | ICD-10-CM | POA: Diagnosis not present

## 2017-09-22 NOTE — ED Notes (Signed)
Called patient and no answer.

## 2017-10-22 ENCOUNTER — Ambulatory Visit: Payer: Self-pay | Admitting: Internal Medicine

## 2017-10-23 ENCOUNTER — Other Ambulatory Visit: Payer: Self-pay | Admitting: Internal Medicine

## 2017-10-23 DIAGNOSIS — E349 Endocrine disorder, unspecified: Secondary | ICD-10-CM

## 2017-11-07 ENCOUNTER — Ambulatory Visit (INDEPENDENT_AMBULATORY_CARE_PROVIDER_SITE_OTHER): Payer: 59 | Admitting: Internal Medicine

## 2017-11-07 VITALS — BP 122/78 | HR 72 | Temp 97.5°F | Resp 16 | Ht 69.0 in | Wt 201.8 lb

## 2017-11-07 DIAGNOSIS — R7303 Prediabetes: Secondary | ICD-10-CM | POA: Diagnosis not present

## 2017-11-07 DIAGNOSIS — I10 Essential (primary) hypertension: Secondary | ICD-10-CM

## 2017-11-07 DIAGNOSIS — G44209 Tension-type headache, unspecified, not intractable: Secondary | ICD-10-CM

## 2017-11-07 DIAGNOSIS — E559 Vitamin D deficiency, unspecified: Secondary | ICD-10-CM | POA: Diagnosis not present

## 2017-11-07 DIAGNOSIS — Z79899 Other long term (current) drug therapy: Secondary | ICD-10-CM

## 2017-11-07 DIAGNOSIS — E782 Mixed hyperlipidemia: Secondary | ICD-10-CM

## 2017-11-07 MED ORDER — PREDNISONE 20 MG PO TABS: ORAL_TABLET | ORAL | 0 refills | Status: DC

## 2017-11-07 NOTE — Progress Notes (Signed)
This very nice 42 y.o. MWM presents for 6 month follow up with HTN, HLD, Pre-Diabetes and Vitamin D Deficiency.      Patient is treated for HTN (2007) & BP has been controlled at home. Today's BP is at goal - 122/78. Patient has had no complaints of any cardiac type chest pain, palpitations, dyspnea / orthopnea / PND, dizziness, claudication, or dependent edema.     Hyperlipidemia is controlled with diet & meds. Patient denies myalgias or other med SE's. Last Lipids were near goal: Lab Results  Component Value Date   CHOL 174 11/07/2017   HDL 45 11/07/2017   LDLCALC 102 (H) 11/07/2017   TRIG 173 (H) 11/07/2017   CHOLHDL 3.9 11/07/2017      Also, the patient has history of OIbesity (BMI 32+) and PreDiabetes (A1c 5.7%/2015)  and has had no symptoms of reactive hypoglycemia, diabetic polys, paresthesias or visual blurring.  Last A1c was Normal & at goal: Lab Results  Component Value Date   HGBA1C 5.1 04/16/2017      Further, the patient also has history of Vitamin D Deficiency ("13"/2008)  and supplements vitamin D without any suspected side-effects. Last vitamin D was at goal: Lab Results  Component Value Date   VD25OH 51 04/16/2017   Current Outpatient Medications on File Prior to Visit  Medication Sig  . acetaminophen (TYLENOL) 325 MG tablet Take 650 mg by mouth every 6 (six) hours as needed.  Marland Kitchen aspirin EC 81 MG tablet Take 81 mg by mouth daily.  Marland Kitchen atorvastatin (LIPITOR) 80 MG tablet Take 0.5 tablets (40 mg total) by mouth daily.  . cholecalciferol (VITAMIN D) 1000 units tablet Take 1,000 Units by mouth daily.  Marland Kitchen lisinopril (PRINIVIL,ZESTRIL) 40 MG tablet TAKE 1 TABLET BY MOUTH DAILY.  Marland Kitchen testosterone cypionate (DEPOTESTOSTERONE CYPIONATE) 200 MG/ML injection 2MLS INTRAMUSCULARLY EVERY 2 WEEKS OR AS DIRECTED   No current facility-administered medications on file prior to visit.    No Known Allergies PMHx:   Past Medical History:  Diagnosis Date  . Hyperlipidemia   .  Hypertension   . Hypogonadism male   . Obesity   . Prediabetes   . Vitamin D deficiency    Immunization History  Administered Date(s) Administered  . Influenza Split 03/31/2014, 04/14/2015  . PPD Test 12/24/2013, 12/29/2014, 01/25/2016, 04/16/2017  . Pneumococcal-Unspecified 12/05/2011  . Tdap 12/05/2011   Past Surgical History:  Procedure Laterality Date  . CERVICAL FUSION     FHx:    Reviewed / unchanged  SHx:    Reviewed / unchanged   Systems Review:  Constitutional: Denies fever, chills, wt changes, headaches, insomnia, fatigue, night sweats, change in appetite. Eyes: Denies redness, blurred vision, diplopia, discharge, itchy, watery eyes.  ENT: Denies discharge, congestion, post nasal drip, epistaxis, sore throat, earache, hearing loss, dental pain, tinnitus, vertigo, sinus pain, snoring.  CV: Denies chest pain, palpitations, irregular heartbeat, syncope, dyspnea, diaphoresis, orthopnea, PND, claudication or edema. Respiratory: denies cough, dyspnea, DOE, pleurisy, hoarseness, laryngitis, wheezing.  Gastrointestinal: Denies dysphagia, odynophagia, heartburn, reflux, water brash, abdominal pain or cramps, nausea, vomiting, bloating, diarrhea, constipation, hematemesis, melena, hematochezia  or hemorrhoids. Genitourinary: Denies dysuria, frequency, urgency, nocturia, hesitancy, discharge, hematuria or flank pain. Musculoskeletal: Denies arthralgias, myalgias, stiffness, jt. swelling, pain, limping or strain/sprain.  Skin: Denies pruritus, rash, hives, warts, acne, eczema or change in skin lesion(s). Neuro: No weakness, tremor, incoordination, spasms, paresthesia or pain. Psychiatric: Denies confusion, memory loss or sensory loss. Endo: Denies change in weight, skin  or hair change.  Heme/Lymph: No excessive bleeding, bruising or enlarged lymph nodes.  Physical Exam  BP 122/78   Pulse 72   Temp (!) 97.5 F (36.4 C)   Resp 16   Ht 5\' 9"  (1.753 m)   Wt 201 lb 12.8 oz (91.5  kg)   BMI 29.80 kg/m   Appears  well nourished, well groomed  and in no distress.  Eyes: PERRLA, EOMs, conjunctiva no swelling or erythema. Sinuses: No frontal/maxillary tenderness ENT/Mouth: EAC's clear, TM's nl w/o erythema, bulging. Nares clear w/o erythema, swelling, exudates. Oropharynx clear without erythema or exudates. Oral hygiene is good. Tongue normal, non obstructing. Hearing intact.  Neck: Supple. Thyroid not palpable. Car 2+/2+ without bruits, nodes or JVD. Chest: Respirations nl with BS clear & equal w/o rales, rhonchi, wheezing or stridor.  Cor: Heart sounds normal w/ regular rate and rhythm without sig. murmurs, gallops, clicks or rubs. Peripheral pulses normal and equal  without edema.  Abdomen: Soft & bowel sounds normal. Non-tender w/o guarding, rebound, hernias, masses or organomegaly.  Lymphatics: Unremarkable.  Musculoskeletal: Full ROM all peripheral extremities, joint stability, 5/5 strength and normal gait.  Skin: Warm, dry without exposed rashes, lesions or ecchymosis apparent.  Neuro: Cranial nerves intact, reflexes equal bilaterally. Sensory-motor testing grossly intact. Tendon reflexes grossly intact.  Pysch: Alert & oriented x 3.  Insight and judgement nl & appropriate. No ideations.  Assessment and Plan:  1. Essential hypertension  - Continue medication, monitor blood pressure at home.  - Continue DASH diet.  Reminder to go to the ER if any CP,  SOB, nausea, dizziness, severe HA, changes vision/speech.  - COMPLETE METABOLIC PANEL WITH GFR - Magnesium - TSH - CBC with Differential/Platelet  2. Hyperlipidemia, mixed  - Continue diet/meds, exercise,& lifestyle modifications.  - Continue monitor periodic cholesterol/liver & renal functions   - Lipid panel - TSH  3. Prediabetes  - Continue diet, exercise, lifestyle modifications.  - Monitor appropriate labs.  - Hemoglobin A1c - Insulin, random  4. Vitamin D deficiency  - Continue  supplementation.   - VITAMIN D 25 Hydroxyl  5. Medication management  - COMPLETE METABOLIC PANEL WITH GFR - Magnesium - Lipid panel - TSH - Hemoglobin A1c - Insulin, random - VITAMIN D 25 Hydroxy  - CBC with Differential/Platelet         Discussed  regular exercise, BP monitoring, weight control to achieve/maintain BMI less than 25 and discussed med and SE's. Recommended labs to assess and monitor clinical status with further disposition pending results of labs. Over 30 minutes of exam, counseling, chart review was performed.

## 2017-11-07 NOTE — Patient Instructions (Signed)

## 2017-11-08 ENCOUNTER — Encounter: Payer: Self-pay | Admitting: Internal Medicine

## 2017-11-08 LAB — HEMOGLOBIN A1C
Hgb A1c MFr Bld: 5.3 % of total Hgb (ref <5.7)
Mean Plasma Glucose: 105 (calc)
eAG (mmol/L): 5.8 (calc)

## 2017-11-08 LAB — COMPLETE METABOLIC PANEL WITH GFR
AG Ratio: 2 (calc) (ref 1.0–2.5)
ALT: 36 U/L (ref 9–46)
AST: 25 U/L (ref 10–40)
Albumin: 5.1 g/dL (ref 3.6–5.1)
Alkaline phosphatase (APISO): 94 U/L (ref 40–115)
BUN: 11 mg/dL (ref 7–25)
CALCIUM: 10.2 mg/dL (ref 8.6–10.3)
CO2: 28 mmol/L (ref 20–32)
CREATININE: 1.02 mg/dL (ref 0.60–1.35)
Chloride: 103 mmol/L (ref 98–110)
GFR, EST NON AFRICAN AMERICAN: 90 mL/min/{1.73_m2} (ref >=60)
GFR, Est African American: 104 mL/min/{1.73_m2} (ref >=60)
Globulin: 2.6 g/dL (calc) (ref 1.9–3.7)
Glucose, Bld: 82 mg/dL (ref 65–99)
Potassium: 4.4 mmol/L (ref 3.5–5.3)
Sodium: 141 mmol/L (ref 135–146)
TOTAL PROTEIN: 7.7 g/dL (ref 6.1–8.1)
Total Bilirubin: 0.7 mg/dL (ref 0.2–1.2)

## 2017-11-08 LAB — LIPID PANEL
CHOL/HDL RATIO: 3.9 (calc) (ref <5.0)
Cholesterol: 174 mg/dL (ref <200)
HDL: 45 mg/dL (ref >40)
LDL CHOLESTEROL (CALC): 102 mg/dL — AB
Non-HDL Cholesterol (Calc): 129 mg/dL (calc) (ref <130)
TRIGLYCERIDES: 173 mg/dL — AB (ref <150)

## 2017-11-08 LAB — CBC WITH DIFFERENTIAL/PLATELET
BASOS PCT: 0.5 %
Basophils Absolute: 42 cells/uL (ref 0–200)
EOS ABS: 91 {cells}/uL (ref 15–500)
Eosinophils Relative: 1.1 %
HCT: 54.2 % — ABNORMAL HIGH (ref 38.5–50.0)
HEMOGLOBIN: 18.4 g/dL — AB (ref 13.2–17.1)
Lymphs Abs: 1378 cells/uL (ref 850–3900)
MCH: 30 pg (ref 27.0–33.0)
MCHC: 33.9 g/dL (ref 32.0–36.0)
MCV: 88.3 fL (ref 80.0–100.0)
MONOS PCT: 9.2 %
MPV: 12.1 fL (ref 7.5–12.5)
Neutro Abs: 6026 cells/uL (ref 1500–7800)
Neutrophils Relative %: 72.6 %
Platelets: 269 10*3/uL (ref 140–400)
RBC: 6.14 10*6/uL — AB (ref 4.20–5.80)
RDW: 12.2 % (ref 11.0–15.0)
Total Lymphocyte: 16.6 %
WBC: 8.3 10*3/uL (ref 3.8–10.8)
WBCMIX: 764 {cells}/uL (ref 200–950)

## 2017-11-08 LAB — TSH: TSH: 1.56 mIU/L (ref 0.40–4.50)

## 2017-11-08 LAB — MAGNESIUM: Magnesium: 2.2 mg/dL (ref 1.5–2.5)

## 2017-11-08 LAB — VITAMIN D 25 HYDROXY (VIT D DEFICIENCY, FRACTURES): Vit D, 25-Hydroxy: 103 ng/mL — ABNORMAL HIGH (ref 30–100)

## 2017-11-08 LAB — INSULIN, RANDOM: INSULIN: 27.3 u[IU]/mL — AB (ref 2.0–19.6)

## 2018-02-01 ENCOUNTER — Other Ambulatory Visit: Payer: Self-pay | Admitting: Internal Medicine

## 2018-02-18 ENCOUNTER — Ambulatory Visit: Payer: Self-pay | Admitting: Adult Health

## 2018-04-29 ENCOUNTER — Other Ambulatory Visit: Payer: Self-pay | Admitting: Internal Medicine

## 2018-04-30 ENCOUNTER — Other Ambulatory Visit: Payer: Self-pay | Admitting: Internal Medicine

## 2018-04-30 DIAGNOSIS — E349 Endocrine disorder, unspecified: Secondary | ICD-10-CM

## 2018-05-28 ENCOUNTER — Encounter: Payer: Self-pay | Admitting: Internal Medicine

## 2018-07-26 ENCOUNTER — Other Ambulatory Visit: Payer: Self-pay | Admitting: Adult Health

## 2018-11-07 ENCOUNTER — Other Ambulatory Visit: Payer: Self-pay | Admitting: Internal Medicine

## 2018-11-07 DIAGNOSIS — E349 Endocrine disorder, unspecified: Secondary | ICD-10-CM

## 2018-11-13 ENCOUNTER — Other Ambulatory Visit: Payer: Self-pay | Admitting: Adult Health

## 2018-11-21 ENCOUNTER — Encounter: Payer: 59 | Admitting: Internal Medicine

## 2019-01-06 ENCOUNTER — Other Ambulatory Visit: Payer: Self-pay | Admitting: Internal Medicine

## 2019-01-06 DIAGNOSIS — E349 Endocrine disorder, unspecified: Secondary | ICD-10-CM

## 2019-01-29 ENCOUNTER — Other Ambulatory Visit: Payer: Self-pay | Admitting: Internal Medicine

## 2019-02-03 ENCOUNTER — Encounter: Payer: BC Managed Care – PPO | Admitting: Internal Medicine

## 2019-02-03 ENCOUNTER — Encounter: Payer: Self-pay | Admitting: Internal Medicine

## 2019-02-05 DIAGNOSIS — E785 Hyperlipidemia, unspecified: Secondary | ICD-10-CM | POA: Diagnosis not present

## 2019-02-05 DIAGNOSIS — R7303 Prediabetes: Secondary | ICD-10-CM | POA: Diagnosis not present

## 2019-02-05 DIAGNOSIS — I10 Essential (primary) hypertension: Secondary | ICD-10-CM | POA: Diagnosis not present

## 2019-02-05 DIAGNOSIS — N529 Male erectile dysfunction, unspecified: Secondary | ICD-10-CM | POA: Diagnosis not present

## 2019-02-12 DIAGNOSIS — E349 Endocrine disorder, unspecified: Secondary | ICD-10-CM | POA: Diagnosis not present

## 2019-02-12 DIAGNOSIS — R7303 Prediabetes: Secondary | ICD-10-CM | POA: Diagnosis not present

## 2019-02-12 DIAGNOSIS — E785 Hyperlipidemia, unspecified: Secondary | ICD-10-CM | POA: Diagnosis not present

## 2019-02-12 DIAGNOSIS — I10 Essential (primary) hypertension: Secondary | ICD-10-CM | POA: Diagnosis not present

## 2019-02-12 DIAGNOSIS — E559 Vitamin D deficiency, unspecified: Secondary | ICD-10-CM | POA: Diagnosis not present

## 2019-08-06 ENCOUNTER — Ambulatory Visit (INDEPENDENT_AMBULATORY_CARE_PROVIDER_SITE_OTHER): Payer: BC Managed Care – PPO | Admitting: Internal Medicine

## 2019-08-06 ENCOUNTER — Other Ambulatory Visit: Payer: Self-pay | Admitting: Internal Medicine

## 2019-08-06 ENCOUNTER — Other Ambulatory Visit: Payer: Self-pay

## 2019-08-06 ENCOUNTER — Other Ambulatory Visit: Payer: Self-pay | Admitting: *Deleted

## 2019-08-06 ENCOUNTER — Encounter: Payer: Self-pay | Admitting: Internal Medicine

## 2019-08-06 VITALS — BP 128/82 | HR 72 | Temp 97.4°F | Resp 16 | Ht 69.5 in | Wt 200.0 lb

## 2019-08-06 DIAGNOSIS — Z79899 Other long term (current) drug therapy: Secondary | ICD-10-CM

## 2019-08-06 DIAGNOSIS — Z130 Encounter for screening for diseases of the blood and blood-forming organs and certain disorders involving the immune mechanism: Secondary | ICD-10-CM | POA: Diagnosis not present

## 2019-08-06 DIAGNOSIS — N401 Enlarged prostate with lower urinary tract symptoms: Secondary | ICD-10-CM | POA: Diagnosis not present

## 2019-08-06 DIAGNOSIS — I10 Essential (primary) hypertension: Secondary | ICD-10-CM | POA: Diagnosis not present

## 2019-08-06 DIAGNOSIS — Z125 Encounter for screening for malignant neoplasm of prostate: Secondary | ICD-10-CM

## 2019-08-06 DIAGNOSIS — E291 Testicular hypofunction: Secondary | ICD-10-CM

## 2019-08-06 DIAGNOSIS — E559 Vitamin D deficiency, unspecified: Secondary | ICD-10-CM

## 2019-08-06 DIAGNOSIS — Z1212 Encounter for screening for malignant neoplasm of rectum: Secondary | ICD-10-CM

## 2019-08-06 DIAGNOSIS — R350 Frequency of micturition: Secondary | ICD-10-CM | POA: Diagnosis not present

## 2019-08-06 DIAGNOSIS — Z13220 Encounter for screening for lipoid disorders: Secondary | ICD-10-CM | POA: Diagnosis not present

## 2019-08-06 DIAGNOSIS — M722 Plantar fascial fibromatosis: Secondary | ICD-10-CM

## 2019-08-06 DIAGNOSIS — Z0000 Encounter for general adult medical examination without abnormal findings: Secondary | ICD-10-CM

## 2019-08-06 DIAGNOSIS — Z8249 Family history of ischemic heart disease and other diseases of the circulatory system: Secondary | ICD-10-CM

## 2019-08-06 DIAGNOSIS — Z1329 Encounter for screening for other suspected endocrine disorder: Secondary | ICD-10-CM | POA: Diagnosis not present

## 2019-08-06 DIAGNOSIS — Z131 Encounter for screening for diabetes mellitus: Secondary | ICD-10-CM | POA: Diagnosis not present

## 2019-08-06 DIAGNOSIS — Z1389 Encounter for screening for other disorder: Secondary | ICD-10-CM

## 2019-08-06 DIAGNOSIS — Z111 Encounter for screening for respiratory tuberculosis: Secondary | ICD-10-CM | POA: Diagnosis not present

## 2019-08-06 DIAGNOSIS — Z0001 Encounter for general adult medical examination with abnormal findings: Secondary | ICD-10-CM

## 2019-08-06 DIAGNOSIS — Z1211 Encounter for screening for malignant neoplasm of colon: Secondary | ICD-10-CM

## 2019-08-06 DIAGNOSIS — R7303 Prediabetes: Secondary | ICD-10-CM

## 2019-08-06 DIAGNOSIS — R5383 Other fatigue: Secondary | ICD-10-CM

## 2019-08-06 DIAGNOSIS — Z136 Encounter for screening for cardiovascular disorders: Secondary | ICD-10-CM | POA: Diagnosis not present

## 2019-08-06 DIAGNOSIS — N5202 Corporo-venous occlusive erectile dysfunction: Secondary | ICD-10-CM

## 2019-08-06 DIAGNOSIS — E782 Mixed hyperlipidemia: Secondary | ICD-10-CM

## 2019-08-06 DIAGNOSIS — N5201 Erectile dysfunction due to arterial insufficiency: Secondary | ICD-10-CM

## 2019-08-06 DIAGNOSIS — R7309 Other abnormal glucose: Secondary | ICD-10-CM

## 2019-08-06 MED ORDER — TADALAFIL 20 MG PO TABS: ORAL_TABLET | ORAL | 3 refills | Status: DC

## 2019-08-06 MED ORDER — PREDNISONE 20 MG PO TABS: ORAL_TABLET | ORAL | 0 refills | Status: DC

## 2019-08-06 MED ORDER — LISINOPRIL 40 MG PO TABS: ORAL_TABLET | ORAL | 1 refills | Status: DC

## 2019-08-06 NOTE — Patient Instructions (Signed)

## 2019-08-06 NOTE — Progress Notes (Signed)
This very nice 44 y.o. MWM presents for a Screening /Preventative Visit & comprehensive evaluation and management of multiple medical co-morbidities.   Patient has been lost to f/u since May 2019 due to lack of Insurance. Patient has been followed in the past for HTN, HLD, Prediabetes and Vitamin D Deficiency.     HTN predates since 2007. Patient's BP has been controlled at home.  Today's BP is at goal - 128/82. Patient denies any cardiac symptoms as chest pain, palpitations, shortness of breath, dizziness or ankle swelling.     Patient's hyperlipidemia is controlled with diet and Atorvastatin. Patient denies myalgias or other medication SE's. Last lipids were near goal with elevated Trig's:  Lab Results  Component Value Date   CHOL 174 11/07/2017   HDL 45 11/07/2017   LDLCALC 102 (H) 11/07/2017   TRIG 173 (H) 11/07/2017   CHOLHDL 3.9 11/07/2017       Patient has hx/o prediabetes (A1c 5.7% / 2015)  and patient denies reactive hypoglycemic symptoms, visual blurring, diabetic polys or paresthesias. Last A1c was Normal & at goal:  Lab Results  Component Value Date   HGBA1C 5.3 11/07/2017        Finally, patient has history of Vitamin D Deficiency ("13" / 2008)  and last vitamin D was at goal:  Lab Results  Component Value Date   VD25OH 103 (H) 11/07/2017    Current Outpatient Medications on File Prior to Visit  Medication Sig  . acetaminophen (TYLENOL) 325 MG tablet Take 650 mg by mouth every 6 (six) hours as needed.  Marland Kitchen atorvastatin (LIPITOR) 80 MG tablet TAKE 1 TABLET DAILY OR AS DIRECTED FOR CHOLESTEROL  . cholecalciferol (VITAMIN D) 1000 units tablet Take 1,000 Units by mouth daily.  Marland Kitchen testosterone cypionate (DEPOTESTOSTERONE CYPIONATE) 200 MG/ML injection 2MLS INTRAMUSCULARLY EVERY 2 WEEKS OR AS DIRECTED  . aspirin EC 81 MG tablet Take 81 mg by mouth daily.   No current facility-administered medications on file prior to visit.   Not on File   Past Medical History:    Diagnosis Date  . Hyperlipidemia   . Hypertension   . Hypogonadism male   . Obesity   . Prediabetes   . Vitamin D deficiency    Health Maintenance  Topic Date Due  . INFLUENZA VACCINE  01/25/2019  . TETANUS/TDAP  12/04/2021  . HIV Screening  Completed   Immunization History  Administered Date(s) Administered  . Influenza Split 03/31/2014, 04/14/2015  . PPD Test 12/24/2013, 12/29/2014, 01/25/2016, 04/16/2017  . Pneumococcal-Unspecified 12/05/2011  . Tdap 12/05/2011    Past Surgical History:  Procedure Laterality Date  . CERVICAL FUSION     Family History  Problem Relation Age of Onset  . Hyperlipidemia Mother   . Hyperlipidemia Father   . Cancer Paternal Grandfather        Lung   Social History   Socioeconomic History  . Marital status: Married    Spouse name: Jenny Reichmann  . 2 daughters   Occupational History  . Sales / Delivery  Tobacco Use  . Smoking status: Current Every Day Smoker    Types: Cigarettes  . Smokeless tobacco: Never Used  Substance and Sexual Activity  . Alcohol use: Yes    Alcohol/week: 4.0 standard drinks    Types: 4 Standard drinks or equivalent per week    Comment: moderate  . Drug use: Yes    Frequency: 2.0 times per week    Types: Marijuana    ROS Constitutional: Denies fever,  chills, weight loss/gain, headaches, insomnia,  night sweats or change in appetite. Does c/o fatigue. Eyes: Denies redness, blurred vision, diplopia, discharge, itchy or watery eyes.  ENT: Denies discharge, congestion, post nasal drip, epistaxis, sore throat, earache, hearing loss, dental pain, Tinnitus, Vertigo, Sinus pain or snoring.  Cardio: Denies chest pain, palpitations, irregular heartbeat, syncope, dyspnea, diaphoresis, orthopnea, PND, claudication or edema Respiratory: denies cough, dyspnea, DOE, pleurisy, hoarseness, laryngitis or wheezing.  Gastrointestinal: Denies dysphagia, heartburn, reflux, water brash, pain, cramps, nausea, vomiting, bloating,  diarrhea, constipation, hematemesis, melena, hematochezia, jaundice or hemorrhoids Genitourinary: Denies dysuria, frequency, urgency, nocturia, hesitancy, discharge, hematuria or flank pain Musculoskeletal: Denies arthralgia, myalgia, stiffness, Jt. Swelling, pain, limp or strain/sprain. Denies Falls. Skin: Denies puritis, rash, hives, warts, acne, eczema or change in skin lesion Neuro: No weakness, tremor, incoordination, spasms, paresthesia or pain Psychiatric: Denies confusion, memory loss or sensory loss. Denies Depression. Endocrine: Denies change in weight, skin, hair change, nocturia, and paresthesia, diabetic polys, visual blurring or hyper / hypo glycemic episodes.  Heme/Lymph: No excessive bleeding, bruising or enlarged lymph nodes.  Physical Exam  BP 128/82   Pulse 72   Temp (!) 97.4 F (36.3 C)   Resp 16   Ht 5' 9.5" (1.765 m)   Wt 200 lb (90.7 kg)   BMI 29.11 kg/m   General Appearance: Well nourished and well groomed and in no apparent distress.  Eyes: PERRLA, EOMs, conjunctiva no swelling or erythema, normal fundi and vessels. Sinuses: No frontal/maxillary tenderness ENT/Mouth: EACs patent / TMs  nl. Nares clear without erythema, swelling, mucoid exudates. Oral hygiene is good. No erythema, swelling, or exudate. Tongue normal, non-obstructing. Tonsils not swollen or erythematous. Hearing normal.  Neck: Supple, thyroid not palpable. No bruits, nodes or JVD. Respiratory: Respiratory effort normal.  BS equal and clear bilateral without rales, rhonci, wheezing or stridor. Cardio: Heart sounds are normal with regular rate and rhythm and no murmurs, rubs or gallops. Peripheral pulses are normal and equal bilaterally without edema. No aortic or femoral bruits. Chest: symmetric with normal excursions and percussion.  Abdomen: Soft, with Nl bowel sounds. Nontender, no guarding, rebound, hernias, masses, or organomegaly.  Lymphatics: Non tender without lymphadenopathy.    Musculoskeletal: Full ROM all peripheral extremities, joint stability, 5/5 strength, and normal gait. Skin: Warm and dry without rashes, lesions, cyanosis, clubbing or  ecchymosis.  Neuro: Cranial nerves intact, reflexes equal bilaterally. Normal muscle tone, no cerebellar symptoms. Sensation intact.  Pysch: Alert and oriented X 3 with normal affect, insight and judgment appropriate.   Assessment and Plan  1. Annual Preventative/Screening Exam   2. Essential hypertension  - EKG 12-Lead - Urinalysis, Routine w reflex microscopic - Microalbumin / creatinine urine ratio - CBC with Differential/Platelet - COMPLETE METABOLIC PANEL WITH GFR - Magnesium - TSH  3. Hyperlipidemia, mixed  - EKG 12-Lead - Lipid panel - TSH  4. Abnormal glucose  - EKG 12-Lead - Hemoglobin A1c - Insulin, random  5. Vitamin D deficiency  - VITAMIN D 25 Hydroxy  6. Prediabetes  - EKG 12-Lead - Hemoglobin A1c - Insulin, random  7. Testosterone Deficiency  - Testosterone  8. Screening for colorectal cancer  - POC Hemoccult Bld/Stl   9. Prostate cancer screening  - PSA  10. Screening-pulmonary TB  - TB Skin Test  11. Screening for ischemic heart disease  - EKG 12-Lead  12. FH: hypertension  - EKG 12-Lead  13. Fatigue  - EKG 12-Lead - Iron,Total/Total Iron Binding Cap - Vitamin B12 - CBC  with Differential/Platelet - TSH  14. Medication management  - Urinalysis, Routine w reflex microscopic - Microalbumin / creatinine urine ratio - Testosterone - CBC with Differential/Platelet - COMPLETE METABOLIC PANEL WITH GFR - Magnesium - Lipid panel - TSH - Hemoglobin A1c - Insulin, random - VITAMIN D 25 Hydroxy         Patient was counseled in prudent diet, weight control to achieve/maintain BMI less than 25, BP monitoring, regular exercise and medications as discussed.  Discussed med effects and SE's. Routine screening labs and tests as requested with regular follow-up as  recommended. Over 40 minutes of exam, counseling, chart review and high complex critical decision making was performed   Kirtland Bouchard, MD  This note is not being shared with the patient  for the following reason: To prevent harm (release of this note would result in harm to the life or physical safety of the patient or another).

## 2019-08-07 LAB — COMPLETE METABOLIC PANEL WITH GFR
AG Ratio: 2.1 (calc) (ref 1.0–2.5)
ALT: 18 U/L (ref 9–46)
AST: 19 U/L (ref 10–40)
Albumin: 4.6 g/dL (ref 3.6–5.1)
Alkaline phosphatase (APISO): 72 U/L (ref 36–130)
BUN: 14 mg/dL (ref 7–25)
CO2: 28 mmol/L (ref 20–32)
Calcium: 10.1 mg/dL (ref 8.6–10.3)
Chloride: 105 mmol/L (ref 98–110)
Creat: 1.03 mg/dL (ref 0.60–1.35)
GFR, Est African American: 101 mL/min/{1.73_m2} (ref >=60)
GFR, Est Non African American: 87 mL/min/{1.73_m2} (ref >=60)
Globulin: 2.2 g/dL (calc) (ref 1.9–3.7)
Glucose, Bld: 91 mg/dL (ref 65–99)
Potassium: 4.4 mmol/L (ref 3.5–5.3)
Sodium: 138 mmol/L (ref 135–146)
Total Bilirubin: 0.8 mg/dL (ref 0.2–1.2)
Total Protein: 6.8 g/dL (ref 6.1–8.1)

## 2019-08-07 LAB — URINALYSIS, ROUTINE W REFLEX MICROSCOPIC
Bilirubin Urine: NEGATIVE
Glucose, UA: NEGATIVE
Hgb urine dipstick: NEGATIVE
Ketones, ur: NEGATIVE
Leukocytes,Ua: NEGATIVE
Nitrite: NEGATIVE
Protein, ur: NEGATIVE
Specific Gravity, Urine: 1.014 (ref 1.001–1.03)
pH: 7.5 (ref 5.0–8.0)

## 2019-08-07 LAB — CBC WITH DIFFERENTIAL/PLATELET
Absolute Monocytes: 731 cells/uL (ref 200–950)
Basophils Absolute: 48 cells/uL (ref 0–200)
Basophils Relative: 0.7 %
Eosinophils Absolute: 104 cells/uL (ref 15–500)
Eosinophils Relative: 1.5 %
HCT: 50.2 % — ABNORMAL HIGH (ref 38.5–50.0)
Hemoglobin: 17 g/dL (ref 13.2–17.1)
Lymphs Abs: 1263 cells/uL (ref 850–3900)
MCH: 31 pg (ref 27.0–33.0)
MCHC: 33.9 g/dL (ref 32.0–36.0)
MCV: 91.6 fL (ref 80.0–100.0)
MPV: 12 fL (ref 7.5–12.5)
Monocytes Relative: 10.6 %
Neutro Abs: 4754 cells/uL (ref 1500–7800)
Neutrophils Relative %: 68.9 %
Platelets: 240 10*3/uL (ref 140–400)
RBC: 5.48 10*6/uL (ref 4.20–5.80)
RDW: 12.1 % (ref 11.0–15.0)
Total Lymphocyte: 18.3 %
WBC: 6.9 10*3/uL (ref 3.8–10.8)

## 2019-08-07 LAB — PSA: PSA: 1.1 ng/mL (ref <=4.0)

## 2019-08-07 LAB — IRON, TOTAL/TOTAL IRON BINDING CAP
%SAT: 37 % (calc) (ref 20–48)
Iron: 106 ug/dL (ref 50–180)
TIBC: 287 mcg/dL (calc) (ref 250–425)

## 2019-08-07 LAB — LIPID PANEL
Cholesterol: 145 mg/dL (ref <200)
HDL: 41 mg/dL (ref >=40)
LDL Cholesterol (Calc): 85 mg/dL (calc)
Non-HDL Cholesterol (Calc): 104 mg/dL (calc) (ref <130)
Total CHOL/HDL Ratio: 3.5 (calc) (ref <5.0)
Triglycerides: 91 mg/dL (ref <150)

## 2019-08-07 LAB — MICROALBUMIN / CREATININE URINE RATIO
Creatinine, Urine: 92 mg/dL (ref 20–320)
Microalb, Ur: <0.2 mg/dL

## 2019-08-07 LAB — VITAMIN D 25 HYDROXY (VIT D DEFICIENCY, FRACTURES): Vit D, 25-Hydroxy: 35 ng/mL (ref 30–100)

## 2019-08-07 LAB — HEMOGLOBIN A1C
Hgb A1c MFr Bld: 5.1 % of total Hgb (ref <5.7)
Mean Plasma Glucose: 100 (calc)
eAG (mmol/L): 5.5 (calc)

## 2019-08-07 LAB — TESTOSTERONE: Testosterone: 665 ng/dL (ref 250–827)

## 2019-08-07 LAB — MAGNESIUM: Magnesium: 2.1 mg/dL (ref 1.5–2.5)

## 2019-08-07 LAB — TSH: TSH: 1.42 mIU/L (ref 0.40–4.50)

## 2019-08-07 LAB — VITAMIN B12: Vitamin B-12: 409 pg/mL (ref 200–1100)

## 2019-08-07 LAB — INSULIN, RANDOM: Insulin: 6 u[IU]/mL

## 2019-08-11 LAB — TB SKIN TEST
Induration: 0 mm
TB Skin Test: NEGATIVE

## 2019-11-03 NOTE — Progress Notes (Signed)
FOLLOW UP  Assessment and Plan:   Hypertension Fairly controlled at this time off of medications Monitor blood pressure at home; patient to call if consistently greater than 130/80 Continue DASH diet.   Reminder to go to the ER if any CP, SOB, nausea, dizziness, severe HA, changes vision/speech, left arm numbness and tingling and jaw pain.  Cholesterol Currently at goal on atorvastatin LDL goal <100 Continue low cholesterol diet and exercise.  Check lipid panel.   Other abnormal glucose Recent A1Cs normalized Continue diet and exercise.  Perform daily foot/skin check, notify office of any concerning changes.  Defer A1C; check CMP for serum glucose   Overweight Long discussion about weight loss, diet, and exercise Recommended diet heavy in fruits and veggies and low in animal meats, cheeses, and dairy products, appropriate calorie intake Discussed ideal weight for height and initial weight goal (185 lb) Patient will work on continuing exercise, work on portion control  Will follow up in 3 months  Vitamin D Def Below goal at last visit; has restarted previous supplement continue supplementation to maintain goal of 70-100 Check Vit D level  Testosterone deficiency Continue to monitor, states medication is helping with symptoms of low T.  Suggested addition of zinc 50 mg daily, work on weight loss  Tobacco use Discussed risks associated with tobacco use and advised to reduce or quit Patient is not ready to do so, but advised to consider strongly Will follow up at the next visit     Continue diet and meds as discussed. Further disposition pending results of labs. Discussed med's effects and SE's.   Over 30 minutes of exam, counseling, chart review, and critical decision making was performed.   Future Appointments  Date Time Provider [place]  02/18/2020  9:30 AM Unk Pinto, MD [facility] None  08/17/2020  9:00 AM Unk Pinto, MD [facility] None     ----------------------------------------------------------------------------------------------------------------------  HPI 44 y.o. male  presents for 3 month follow up on hypertension, cholesterol, glucose management (hx of prediabetes), obesity, testosterone deficiency and vitamin D deficiency.   Difficulty falling asleep, takes melatonin which works well intermittently. Ongoing since childhood. Admits watching TV in bedroom prior to bedtime - discussed sleep hygiene.   he currently continues to smoke 1 pack a day; since 1989, 32 pack year history, discussed risks associated with smoking, patient is not ready to quit, but has quit in the past with chantix, might consider later this year.   BMI is Body mass index is 28.33 kg/m., he has new job, more physically active, delivering uniforms, lifting and walking. Admits eating bad food, fast food, snacking, but cutting down on portions and stopping eating later.  Wt Readings from Last 3 Encounters:  11/05/19 194 lb 9.6 oz (88.3 kg)  08/06/19 200 lb (90.7 kg)  11/07/17 201 lb 12.8 oz (91.5 kg)   He BP is controlled at home, 120s/80s, today their BP is BP: 120/82  He does not workout. He denies chest pain, shortness of breath, dizziness.   He is on cholesterol medication (atorvastatin 40 mg daily) and denies myalgias. His cholesterol is at goal. The cholesterol last visit was:   Lab Results  Component Value Date   CHOL 145 08/06/2019   HDL 41 08/06/2019   LDLCALC 85 08/06/2019   TRIG 91 08/06/2019   CHOLHDL 3.5 08/06/2019    He has been working on diet and exercise for glucose management (hx of prediabetes), and denies increased appetite, nausea, paresthesia of the feet, polydipsia, polyuria, visual  disturbances and vomiting. Last A1C in the office was:  Lab Results  Component Value Date   HGBA1C 5.1 08/06/2019   Patient is on Vitamin D supplement and below goal at recent check, was off of supplement, back taking 10000 IU which had  him at goal previous:    Lab Results  Component Value Date   VD25OH 35 08/06/2019     He has a history of testosterone deficiency and is on testosterone replacement, taking 200 mg IM every 2 weeks. He states that the testosterone helps with his energy, libido, muscle mass. Lab Results  Component Value Date   TESTOSTERONE 665 08/06/2019      Current Medications:  Current Outpatient Medications on File Prior to Visit  Medication Sig  . acetaminophen (TYLENOL) 325 MG tablet Take 650 mg by mouth every 6 (six) hours as needed.  Marland Kitchen aspirin EC 81 MG tablet Take 81 mg by mouth daily.  Marland Kitchen atorvastatin (LIPITOR) 80 MG tablet TAKE 1 TABLET DAILY OR AS DIRECTED FOR CHOLESTEROL  . cholecalciferol (VITAMIN D) 1000 units tablet Take 1,000 Units by mouth daily.  . tadalafil (CIALIS) 20 MG tablet Take 1/2 to 1 tablet every 2 to 3 days as needed for XXXX  . testosterone cypionate (DEPOTESTOSTERONE CYPIONATE) 200 MG/ML injection 2MLS INTRAMUSCULARLY EVERY 2 WEEKS OR AS DIRECTED  . lisinopril (ZESTRIL) 40 MG tablet Take 1 tablet daily for blood pressure. (Patient not taking: Reported on 11/05/2019)   No current facility-administered medications on file prior to visit.     Allergies: Not on File   Medical History:  Past Medical History:  Diagnosis Date  . Hyperlipidemia   . Hypertension   . Hypogonadism male   . Obesity   . Prediabetes   . Vitamin D deficiency    Family history- Reviewed and unchanged Social history- Reviewed and unchanged   Review of Systems:  Review of Systems  Constitutional: Negative for chills, diaphoresis, fever, malaise/fatigue and weight loss.  HENT: Negative for congestion, ear discharge, ear pain, hearing loss, sinus pain, sore throat and tinnitus.   Eyes: Negative for blurred vision and double vision.  Respiratory: Negative for cough, sputum production, shortness of breath and wheezing.   Cardiovascular: Negative for chest pain, palpitations, orthopnea,  claudication and leg swelling.  Gastrointestinal: Negative for abdominal pain, blood in stool, constipation, diarrhea, heartburn, melena, nausea and vomiting.  Genitourinary: Negative.   Musculoskeletal: Negative for joint pain and myalgias.  Skin: Negative for rash.  Neurological: Negative for dizziness, tingling, sensory change, weakness and headaches.  Endo/Heme/Allergies: Negative for environmental allergies and polydipsia.  Psychiatric/Behavioral: Negative for depression, substance abuse and suicidal ideas. The patient has insomnia (chronic difficulty falling asleep). The patient is not nervous/anxious.   All other systems reviewed and are negative.     Physical Exam: BP 120/82   Pulse 68   Temp (!) 97.2 F (36.2 C)   Wt 194 lb 9.6 oz (88.3 kg)   SpO2 97%   BMI 28.33 kg/m  Wt Readings from Last 3 Encounters:  11/05/19 194 lb 9.6 oz (88.3 kg)  08/06/19 200 lb (90.7 kg)  11/07/17 201 lb 12.8 oz (91.5 kg)   General Appearance: Well nourished, in no apparent distress. Eyes: PERRLA, EOMs, conjunctiva no swelling or erythema Sinuses: No Frontal/maxillary tenderness ENT/Mouth: Ext aud canals clear, TMs without erythema, bulging. No erythema, swelling, or exudate on post pharynx.  Tonsils not swollen or erythematous. Hearing normal.  Neck: Supple, thyroid normal.  Respiratory: Respiratory effort normal, BS equal  bilaterally without rales, rhonchi, wheezing or stridor.  Cardio: RRR with no MRGs. Brisk peripheral pulses without edema.  Abdomen: Soft, + BS.  Non tender, no guarding, rebound, hernias, masses. Lymphatics: Non tender without lymphadenopathy.  Musculoskeletal: Full ROM, 5/5 strength, Normal gait Skin: Warm, dry without rashes, lesions, ecchymosis.  Neuro: Cranial nerves intact. No cerebellar symptoms.  Psych: Awake and oriented X 3, normal affect, Insight and Judgment appropriate.    Izora Ribas, NP 8:54 AM [facility]

## 2019-11-05 ENCOUNTER — Encounter: Payer: Self-pay | Admitting: Adult Health

## 2019-11-05 ENCOUNTER — Other Ambulatory Visit: Payer: Self-pay

## 2019-11-05 ENCOUNTER — Ambulatory Visit: Payer: BC Managed Care – PPO | Admitting: Adult Health

## 2019-11-05 VITALS — BP 120/82 | HR 68 | Temp 97.2°F | Wt 194.6 lb

## 2019-11-05 DIAGNOSIS — R7309 Other abnormal glucose: Secondary | ICD-10-CM

## 2019-11-05 DIAGNOSIS — E782 Mixed hyperlipidemia: Secondary | ICD-10-CM

## 2019-11-05 DIAGNOSIS — E559 Vitamin D deficiency, unspecified: Secondary | ICD-10-CM

## 2019-11-05 DIAGNOSIS — E349 Endocrine disorder, unspecified: Secondary | ICD-10-CM

## 2019-11-05 DIAGNOSIS — Z79899 Other long term (current) drug therapy: Secondary | ICD-10-CM | POA: Diagnosis not present

## 2019-11-05 DIAGNOSIS — E669 Obesity, unspecified: Secondary | ICD-10-CM | POA: Diagnosis not present

## 2019-11-05 DIAGNOSIS — N5202 Corporo-venous occlusive erectile dysfunction: Secondary | ICD-10-CM

## 2019-11-05 DIAGNOSIS — E291 Testicular hypofunction: Secondary | ICD-10-CM | POA: Diagnosis not present

## 2019-11-05 DIAGNOSIS — I10 Essential (primary) hypertension: Secondary | ICD-10-CM | POA: Diagnosis not present

## 2019-11-05 DIAGNOSIS — F17200 Nicotine dependence, unspecified, uncomplicated: Secondary | ICD-10-CM

## 2019-11-05 MED ORDER — TESTOSTERONE CYPIONATE 200 MG/ML IM SOLN: 200.0000 mg | INTRAMUSCULAR | 0 refills | Status: DC

## 2019-11-05 MED ORDER — TADALAFIL 20 MG PO TABS: ORAL_TABLET | ORAL | 3 refills | Status: DC

## 2019-11-05 NOTE — Patient Instructions (Addendum)
Goals    . Blood Pressure < 130/80    . LDL CALC < 100    . Quit Smoking        Consider adding zinc 40-50 mg daily    9 Ways to Naturally Increase Testosterone Levels  1.   Lose Weight If you're overweight, shedding the excess pounds may increase your testosterone levels, according to research presented at the Endocrine Society's 2012 meeting. Overweight men are more likely to have low testosterone levels to begin with, so this is an important trick to increase your body's testosterone production when you need it most.  2.   High-Intensity Exercise like Peak Fitness  Short intense exercise has a proven positive effect on increasing testosterone levels and preventing its decline. That's unlike aerobics or prolonged moderate exercise, which have shown to have negative or no effect on testosterone levels. Having a whey protein meal after exercise can further enhance the satiety/testosterone-boosting impact (hunger hormones cause the opposite effect on your testosterone and libido). Here's a summary of what a typical high-intensity Peak Fitness routine might look like: " Warm up for three minutes  " Exercise as hard and fast as you can for 30 seconds. You should feel like you couldn't possibly go on another few seconds  " Recover at a slow to moderate pace for 90 seconds  " Repeat the high intensity exercise and recovery 7 more times .  3.   Consume Plenty of Zinc The mineral zinc is important for testosterone production, and supplementing your diet for as little as six weeks has been shown to cause a marked improvement in testosterone among men with low levels.1 Likewise, research has shown that restricting dietary sources of zinc leads to a significant decrease in testosterone, while zinc supplementation increases it2 -- and even protects men from exercised-induced reductions in testosterone levels.3 It's estimated that up to 79 percent of adults over the age of 60 may have lower than  recommended zinc intakes; even when dietary supplements were added in, an estimated 20-25 percent of older adults still had inadequate zinc intakes, according to a [employer] and Nutrition Examination Survey.4 Your diet is the best source of zinc; along with protein-rich foods like meats and fish, other good dietary sources of zinc include raw milk, raw cheese, beans, and yogurt or kefir made from raw milk. It can be difficult to obtain enough dietary zinc if you're a vegetarian, and also for meat-eaters as well, largely because of conventional farming methods that rely heavily on chemical fertilizers and pesticides. These chemicals deplete the soil of nutrients ... nutrients like zinc that must be absorbed by plants in order to be passed on to you. In many cases, you may further deplete the nutrients in your food by the way you prepare it. For most food, cooking it will drastically reduce its levels of nutrients like zinc ... particularly over-cooking, which many people do. If you decide to use a zinc supplement, stick to a dosage of less than 40 mg a day, as this is the recommended adult upper limit. Taking too much zinc can interfere with your body's ability to absorb other minerals, especially copper, and may cause nausea as a side effect.  4.   Strength Training In addition to Peak Fitness, strength training is also known to boost testosterone levels, provided you are doing so intensely enough. When strength training to boost testosterone, you'll want to increase the weight and lower your number of reps, and then focus on  exercises that work a large number of muscles, such as dead lifts or squats.  You can "turbo-charge" your weight training by going slower. By slowing down your movement, you're actually turning it into a high-intensity exercise. Super Slow movement allows your muscle, at the microscopic level, to access the maximum number of cross-bridges between the protein filaments that produce  movement in the muscle.   5.   Optimize Your Vitamin D Levels Vitamin D, a steroid hormone, is essential for the healthy development of the nucleus of the sperm cell, and helps maintain semen quality and sperm count. Vitamin D also increases levels of testosterone, which may boost libido. In one study, overweight men who were given vitamin D supplements had a significant increase in testosterone levels after one year.5   6.   Reduce Stress When you're under a lot of stress, your body releases high levels of the stress hormone cortisol. This hormone actually blocks the effects of testosterone,6 presumably because, from a biological standpoint, testosterone-associated behaviors (mating, competing, aggression) may have lowered your chances of survival in an emergency (hence, the "fight or flight" response is dominant, courtesy of cortisol).  7.   Limit or Eliminate Sugar from Your Diet Testosterone levels decrease after you eat sugar, which is likely because the sugar leads to a high insulin level, another factor leading to low testosterone.7 Based on USDA estimates, the average American consumes 12 teaspoons of sugar a day, which equates to about TWO TONS of sugar during a lifetime.  8.   Eat Healthy Fats By healthy, this means not only mon- and polyunsaturated fats, like that found in avocadoes and nuts, but also saturated, as these are essential for building testosterone. Research shows that a diet with less than 40 percent of energy as fat (and that mainly from animal sources, i.e. saturated) lead to a decrease in testosterone levels.8 My personal diet is about 60-70 percent healthy fat, and other experts agree that the ideal diet includes somewhere between 50-70 percent fat.  It's important to understand that your body requires saturated fats from animal and vegetable sources (such as meat, dairy, certain oils, and tropical plants like coconut) for optimal functioning, and if you neglect this  important food group in favor of sugar, grains and other starchy carbs, your health and weight are almost guaranteed to suffer. Examples of healthy fats you can eat more of to give your testosterone levels a boost include: Olives and Olive oil  Coconuts and coconut oil Butter made from raw grass-fed organic milk Raw nuts, such as, almonds or pecans Organic pastured egg yolks Avocados Grass-fed meats Palm oil Unheated organic nut oils   9.   Boost Your Intake of Branch Chain Amino Acids (BCAA) from Foods Like [place] suggests that BCAAs result in higher testosterone levels, particularly when taken along with resistance training.9 While BCAAs are available in supplement form, you'll find the highest concentrations of BCAAs like leucine in dairy products - especially quality cheeses and whey protein. Even when getting leucine from your natural food supply, it's often wasted or used as a building block instead of an anabolic agent. So to create the correct anabolic environment, you need to boost leucine consumption way beyond mere maintenance levels. That said, keep in mind that using leucine as a free form amino acid can be highly counterproductive as when free form amino acids are artificially administrated, they rapidly enter your circulation while disrupting insulin function, and impairing your body's glycemic control. Food-based leucine  is really the ideal form that can benefit your muscles without side effects.      SMOKING CESSATION  [employer] cancer society  [phone] for more information or for a free program for smoking cessation help.   You can call QUIT SMART 1-800-QUIT-NOW for free nicotine patches or replacement therapy- if they are out- keep calling  [facility] Can call for smoking cessation classes, [phone]  If you have a smart phone, please look up Smoke Free app, this will help you stay on track and give you information about money you have  saved, life that you have gained back and a ton of more information.     ADVANTAGES OF QUITTING SMOKING  Within 20 minutes, blood pressure decreases. Your pulse is at normal level.  After 8 hours, carbon monoxide levels in the blood return to normal. Your oxygen level increases.  After 24 hours, the chance of having a heart attack starts to decrease. Your breath, hair, and body stop smelling like smoke.  After 48 hours, damaged nerve endings begin to recover. Your sense of taste and smell improve.  After 72 hours, the body is virtually free of nicotine. Your bronchial tubes relax and breathing becomes easier.  After 2 to 12 weeks, lungs can hold more air. Exercise becomes easier and circulation improves.  After 1 year, the risk of coronary heart disease is cut in half.  After 5 years, the risk of stroke falls to the same as a nonsmoker.  After 10 years, the risk of lung cancer is cut in half and the risk of other cancers decreases significantly.  After 15 years, the risk of coronary heart disease drops, usually to the level of a nonsmoker.  You will have extra money to spend on things other than cigarettes.

## 2019-11-06 LAB — COMPLETE METABOLIC PANEL WITH GFR
AG Ratio: 2 (calc) (ref 1.0–2.5)
ALT: 20 U/L (ref 9–46)
AST: 18 U/L (ref 10–40)
Albumin: 4.7 g/dL (ref 3.6–5.1)
Alkaline phosphatase (APISO): 79 U/L (ref 36–130)
BUN: 18 mg/dL (ref 7–25)
CO2: 26 mmol/L (ref 20–32)
Calcium: 10.1 mg/dL (ref 8.6–10.3)
Chloride: 105 mmol/L (ref 98–110)
Creat: 0.89 mg/dL (ref 0.60–1.35)
GFR, Est African American: 120 mL/min/{1.73_m2} (ref >=60)
GFR, Est Non African American: 103 mL/min/{1.73_m2} (ref >=60)
Globulin: 2.3 g/dL (calc) (ref 1.9–3.7)
Glucose, Bld: 87 mg/dL (ref 65–99)
Potassium: 4.7 mmol/L (ref 3.5–5.3)
Sodium: 139 mmol/L (ref 135–146)
Total Bilirubin: 0.7 mg/dL (ref 0.2–1.2)
Total Protein: 7 g/dL (ref 6.1–8.1)

## 2019-11-06 LAB — CBC WITH DIFFERENTIAL/PLATELET
Absolute Monocytes: 679 cells/uL (ref 200–950)
Basophils Absolute: 88 cells/uL (ref 0–200)
Basophils Relative: 1.2 %
Eosinophils Absolute: 197 cells/uL (ref 15–500)
Eosinophils Relative: 2.7 %
HCT: 54.1 % — ABNORMAL HIGH (ref 38.5–50.0)
Hemoglobin: 18.1 g/dL — ABNORMAL HIGH (ref 13.2–17.1)
Lymphs Abs: 1584 cells/uL (ref 850–3900)
MCH: 30.3 pg (ref 27.0–33.0)
MCHC: 33.5 g/dL (ref 32.0–36.0)
MCV: 90.6 fL (ref 80.0–100.0)
MPV: 11.5 fL (ref 7.5–12.5)
Monocytes Relative: 9.3 %
Neutro Abs: 4752 cells/uL (ref 1500–7800)
Neutrophils Relative %: 65.1 %
Platelets: 266 10*3/uL (ref 140–400)
RBC: 5.97 10*6/uL — ABNORMAL HIGH (ref 4.20–5.80)
RDW: 12.3 % (ref 11.0–15.0)
Total Lymphocyte: 21.7 %
WBC: 7.3 10*3/uL (ref 3.8–10.8)

## 2019-11-06 LAB — LIPID PANEL
Cholesterol: 177 mg/dL (ref <200)
HDL: 45 mg/dL (ref >=40)
LDL Cholesterol (Calc): 105 mg/dL (calc) — ABNORMAL HIGH
Non-HDL Cholesterol (Calc): 132 mg/dL (calc) — ABNORMAL HIGH (ref <130)
Total CHOL/HDL Ratio: 3.9 (calc) (ref <5.0)
Triglycerides: 158 mg/dL — ABNORMAL HIGH (ref <150)

## 2019-11-06 LAB — MAGNESIUM: Magnesium: 2.2 mg/dL (ref 1.5–2.5)

## 2019-11-06 LAB — TSH: TSH: 1.4 mIU/L (ref 0.40–4.50)

## 2019-11-06 LAB — VITAMIN D 25 HYDROXY (VIT D DEFICIENCY, FRACTURES): Vit D, 25-Hydroxy: 66 ng/mL (ref 30–100)

## 2019-12-04 ENCOUNTER — Encounter: Payer: Self-pay | Admitting: Internal Medicine

## 2020-01-14 ENCOUNTER — Other Ambulatory Visit: Payer: Self-pay | Admitting: Adult Health

## 2020-02-17 ENCOUNTER — Encounter: Payer: Self-pay | Admitting: Internal Medicine

## 2020-02-17 NOTE — Progress Notes (Signed)
History of Present Illness:       This very nice 44 y.o.  MWM presents for 6 month follow up with HTN, HLD, Pre-Diabetes and Vitamin D Deficiency.       Patient was treated for HTN (2007) & currently under expectant monitoring.  BP has been controlled at home. Today's BP: is at goal - 124/82. Patient has had no complaints of any cardiac type chest pain, palpitations, dyspnea / orthopnea / PND, dizziness, claudication, or dependent edema.      Hyperlipidemia is controlled with diet &  Atorvastatin. Patient denies myalgias or other med SE's. Last Lipids were near goal:  Lab Results  Component Value Date   CHOL 177 11/05/2019   HDL 45 11/05/2019   LDLCALC 105 (H) 11/05/2019   TRIG 158 (H) 11/05/2019   CHOLHDL 3.9 11/05/2019    Also, the patient has history of PreDiabetes (A1c 5.7% / 2015)  and has had no symptoms of reactive hypoglycemia, diabetic polys, paresthesias or visual blurring.  Last A1c was  Normal & at goal:  Lab Results  Component Value Date   HGBA1C 5.1 08/06/2019        Patient has hx/o Low Testosterone of 218 in 2012 and 201 in 2014 and is on Depo-Testosterone injections with improved sense of well-being.           Further, the patient also has history of Vitamin D Deficiency  ("13" / 2008)  and supplements vitamin D without any suspected side-effects. Last vitamin D was at goal:  Lab Results  Component Value Date   VD25OH 66 11/05/2019    Current Outpatient Medications on File Prior to Visit  Medication Sig  . acetaminophen (TYLENOL) 325 MG tablet Take 650 mg by mouth every 6 (six) hours as needed.  Marland Kitchen aspirin EC 81 MG tablet Take 81 mg by mouth daily.  Marland Kitchen atorvastatin (LIPITOR) 80 MG tablet TAKE 1 TABLET DAILY OR AS DIRECTED FOR CHOLESTEROL  . cholecalciferol (VITAMIN D) 1000 units tablet Take 10,000 Units by mouth daily.  . tadalafil (CIALIS) 20 MG tablet Take 1/2 to 1 tablet every 2 to 3 days as needed for XXXX  . testosterone cypionate  (DEPOTESTOSTERONE CYPIONATE) 200 MG/ML injection Inject 1 mL (200 mg total) into the muscle every 14 (fourteen) days.   No current facility-administered medications on file prior to visit.    PMHx:   Past Medical History:  Diagnosis Date  . Hyperlipidemia   . Hypertension   . Hypogonadism male   . Obesity   . Prediabetes   . Vitamin D deficiency     Immunization History  Administered Date(s) Administered  . Influenza Split 03/31/2014, 04/14/2015  . PFIZER SARS-COV-2 Vaccination 09/28/2019, 10/29/2019  . PPD Test 12/24/2013, 12/29/2014, 01/25/2016, 04/16/2017, 08/06/2019  . Pneumococcal-Unspecified 12/05/2011  . Tdap 12/05/2011    Past Surgical History:  Procedure Laterality Date  . CERVICAL FUSION      FHx:    Reviewed / unchanged  SHx:    Reviewed / unchanged   Systems Review:  Constitutional: Denies fever, chills, wt changes, headaches, insomnia, fatigue, night sweats, change in appetite. Eyes: Denies redness, blurred vision, diplopia, discharge, itchy, watery eyes.  ENT: Denies discharge, congestion, post nasal drip, epistaxis, sore throat, earache, hearing loss, dental pain, tinnitus, vertigo, sinus pain, snoring.  CV: Denies chest pain, palpitations, irregular heartbeat, syncope, dyspnea, diaphoresis, orthopnea, PND, claudication or edema. Respiratory: denies cough, dyspnea, DOE, pleurisy, hoarseness, laryngitis, wheezing.  Gastrointestinal: Denies dysphagia,  odynophagia, heartburn, reflux, water brash, abdominal pain or cramps, nausea, vomiting, bloating, diarrhea, constipation, hematemesis, melena, hematochezia  or hemorrhoids. Genitourinary: Denies dysuria, frequency, urgency, nocturia, hesitancy, discharge, hematuria or flank pain. Musculoskeletal: Denies arthralgias, myalgias, stiffness, jt. swelling, pain, limping or strain/sprain.  Skin: Denies pruritus, rash, hives, warts, acne, eczema or change in skin lesion(s). Neuro: No weakness, tremor, incoordination,  spasms, paresthesia or pain. Psychiatric: Denies confusion, memory loss or sensory loss. Endo: Denies change in weight, skin or hair change.  Heme/Lymph: No excessive bleeding, bruising or enlarged lymph nodes.  Physical Exam  BP 124/82   Pulse 72   Temp (!) 97.2 F (36.2 C)   Resp 16   Ht 5' 9.5" (1.765 m)   Wt 190 lb 1.6 oz (86.2 kg)   BMI 27.67 kg/m   Appears  well nourished, well groomed  and in no distress.  Eyes: PERRLA, EOMs, conjunctiva no swelling or erythema. Sinuses: No frontal/maxillary tenderness ENT/Mouth: EAC's clear, TM's nl w/o erythema, bulging. Nares clear w/o erythema, swelling, exudates. Oropharynx clear without erythema or exudates. Oral hygiene is good. Tongue normal, non obstructing. Hearing intact.  Neck: Supple. Thyroid not palpable. Car 2+/2+ without bruits, nodes or JVD. Chest: Respirations nl with BS clear & equal w/o rales, rhonchi, wheezing or stridor.  Cor: Heart sounds normal w/ regular rate and rhythm without sig. murmurs, gallops, clicks or rubs. Peripheral pulses normal and equal  without edema.  Abdomen: Soft & bowel sounds normal. Non-tender w/o guarding, rebound, hernias, masses or organomegaly.  Lymphatics: Unremarkable.  Musculoskeletal: Full ROM all peripheral extremities, joint stability, 5/5 strength and normal gait.  Skin: Warm, dry without exposed rashes, lesions or ecchymosis apparent.  Neuro: Cranial nerves intact, reflexes equal bilaterally. Sensory-motor testing grossly intact. Tendon reflexes grossly intact.  Pysch: Alert & oriented x 3.  Insight and judgement nl & appropriate. No ideations.  Assessment and Plan:  1. Labile hypertension  - Continue medication, monitor blood pressure at home.  - Continue DASH diet.  Reminder to go to the ER if any CP,  SOB, nausea, dizziness, severe HA, changes vision/speech.  - CBC with Differential/Platelet - COMPLETE METABOLIC PANEL WITH GFR - Magnesium - TSH  2. Hyperlipidemia,  mixed  - Continue diet/meds, exercise,& lifestyle modifications.  - Continue monitor periodic cholesterol/liver & renal functions   - Lipid panel - TSH  3. Abnormal glucose  - Continue diet, exercise  - Lifestyle modifications.  - Monitor appropriate labs.  - Hemoglobin A1c - Insulin, random  4. Vitamin D deficiency  - Continue supplementation.  - VITAMIN D 25 Hydroxy  5. Testosterone deficiency  - Testosterone  6. Medication management  - CBC with Differential/Platelet - COMPLETE METABOLIC PANEL WITH GFR - Magnesium - Lipid panel - TSH - Hemoglobin A1c - Insulin, random - VITAMIN D 25 Hydroxy - Testosterone       Discussed  regular exercise, BP monitoring, weight control to achieve/maintain BMI less than 25 and discussed med and SE's. Recommended labs to assess and monitor clinical status with further disposition pending results of labs.  I discussed the assessment and treatment plan with the patient. The patient was provided an opportunity to ask questions and all were answered. The patient agreed with the plan and demonstrated an understanding of the instructions.  I provided over 30 minutes of exam, counseling, chart review and  complex critical decision making.         The patient was advised to call back or seek an in-person evaluation if  the symptoms worsen or if the condition fails to improve as anticipated.   Kirtland Bouchard, MD

## 2020-02-17 NOTE — Patient Instructions (Signed)

## 2020-02-18 ENCOUNTER — Other Ambulatory Visit: Payer: Self-pay

## 2020-02-18 ENCOUNTER — Ambulatory Visit: Payer: BC Managed Care – PPO | Admitting: Internal Medicine

## 2020-02-18 VITALS — BP 124/82 | HR 72 | Temp 97.2°F | Resp 16 | Ht 69.5 in | Wt 190.1 lb

## 2020-02-18 DIAGNOSIS — E782 Mixed hyperlipidemia: Secondary | ICD-10-CM

## 2020-02-18 DIAGNOSIS — R0989 Other specified symptoms and signs involving the circulatory and respiratory systems: Secondary | ICD-10-CM

## 2020-02-18 DIAGNOSIS — R7309 Other abnormal glucose: Secondary | ICD-10-CM | POA: Diagnosis not present

## 2020-02-18 DIAGNOSIS — E349 Endocrine disorder, unspecified: Secondary | ICD-10-CM

## 2020-02-18 DIAGNOSIS — E559 Vitamin D deficiency, unspecified: Secondary | ICD-10-CM

## 2020-02-18 DIAGNOSIS — Z79899 Other long term (current) drug therapy: Secondary | ICD-10-CM

## 2020-02-19 LAB — COMPLETE METABOLIC PANEL WITH GFR
AG Ratio: 2.1 (calc) (ref 1.0–2.5)
ALT: 22 U/L (ref 9–46)
AST: 20 U/L (ref 10–40)
Albumin: 4.6 g/dL (ref 3.6–5.1)
Alkaline phosphatase (APISO): 81 U/L (ref 36–130)
BUN: 12 mg/dL (ref 7–25)
CO2: 29 mmol/L (ref 20–32)
Calcium: 10.1 mg/dL (ref 8.6–10.3)
Chloride: 103 mmol/L (ref 98–110)
Creat: 0.93 mg/dL (ref 0.60–1.35)
GFR, Est African American: 114 mL/min/{1.73_m2} (ref >=60)
GFR, Est Non African American: 98 mL/min/{1.73_m2} (ref >=60)
Globulin: 2.2 g/dL (calc) (ref 1.9–3.7)
Glucose, Bld: 78 mg/dL (ref 65–99)
Potassium: 4.6 mmol/L (ref 3.5–5.3)
Sodium: 139 mmol/L (ref 135–146)
Total Bilirubin: 0.5 mg/dL (ref 0.2–1.2)
Total Protein: 6.8 g/dL (ref 6.1–8.1)

## 2020-02-19 LAB — HEMOGLOBIN A1C
Hgb A1c MFr Bld: 5.1 % of total Hgb (ref <5.7)
Mean Plasma Glucose: 100 (calc)
eAG (mmol/L): 5.5 (calc)

## 2020-02-19 LAB — CBC WITH DIFFERENTIAL/PLATELET
Absolute Monocytes: 694 cells/uL (ref 200–950)
Basophils Absolute: 41 cells/uL (ref 0–200)
Basophils Relative: 0.6 %
Eosinophils Absolute: 129 cells/uL (ref 15–500)
Eosinophils Relative: 1.9 %
HCT: 55.3 % — ABNORMAL HIGH (ref 38.5–50.0)
Hemoglobin: 18.5 g/dL — ABNORMAL HIGH (ref 13.2–17.1)
Lymphs Abs: 1455 cells/uL (ref 850–3900)
MCH: 30.6 pg (ref 27.0–33.0)
MCHC: 33.5 g/dL (ref 32.0–36.0)
MCV: 91.6 fL (ref 80.0–100.0)
MPV: 12.2 fL (ref 7.5–12.5)
Monocytes Relative: 10.2 %
Neutro Abs: 4481 cells/uL (ref 1500–7800)
Neutrophils Relative %: 65.9 %
Platelets: 224 10*3/uL (ref 140–400)
RBC: 6.04 10*6/uL — ABNORMAL HIGH (ref 4.20–5.80)
RDW: 12 % (ref 11.0–15.0)
Total Lymphocyte: 21.4 %
WBC: 6.8 10*3/uL (ref 3.8–10.8)

## 2020-02-19 LAB — MAGNESIUM: Magnesium: 2.2 mg/dL (ref 1.5–2.5)

## 2020-02-19 LAB — LIPID PANEL
Cholesterol: 141 mg/dL (ref <200)
HDL: 39 mg/dL — ABNORMAL LOW (ref >=40)
LDL Cholesterol (Calc): 79 mg/dL (calc)
Non-HDL Cholesterol (Calc): 102 mg/dL (calc) (ref <130)
Total CHOL/HDL Ratio: 3.6 (calc) (ref <5.0)
Triglycerides: 133 mg/dL (ref <150)

## 2020-02-19 LAB — VITAMIN D 25 HYDROXY (VIT D DEFICIENCY, FRACTURES): Vit D, 25-Hydroxy: 78 ng/mL (ref 30–100)

## 2020-02-19 LAB — TSH: TSH: 1.08 mIU/L (ref 0.40–4.50)

## 2020-02-19 LAB — TESTOSTERONE: Testosterone: 1240 ng/dL — ABNORMAL HIGH (ref 250–827)

## 2020-02-19 LAB — INSULIN, RANDOM: Insulin: 7 u[IU]/mL

## 2020-02-19 NOTE — Progress Notes (Signed)
==================================================== -   Test results slightly outside the reference range are not unusual. If there is anything important, I will review this with you,  otherwise it is considered normal test values.  If you have further questions,  please do not hesitate to contact me at the office or via My Chart.  ====================================================  -  Total Chol = 141  and LDL Chol = 79     - Both much better    -  Excellent   - Very low risk for Heart Attack  / Stroke  - Please continue Meds & diet same  =============================================================  - A1c - Normal - Great  !    No Diabetes ====================================================  -  Vitamin D = 78 - Excellent  also ====================================================  -  Testosterone high from recent shot  ====================================================  -  All Else - CBC - Kidneys - Electrolytes - Liver - Magnesium & Thyroid    - all  Normal / OK ====================================================   - Keep up the [place] Work ! ====================================================

## 2020-02-21 ENCOUNTER — Encounter: Payer: Self-pay | Admitting: Internal Medicine

## 2020-02-29 ENCOUNTER — Other Ambulatory Visit: Payer: Self-pay

## 2020-02-29 DIAGNOSIS — E349 Endocrine disorder, unspecified: Secondary | ICD-10-CM

## 2020-02-29 MED ORDER — TESTOSTERONE CYPIONATE 200 MG/ML IM SOLN: INTRAMUSCULAR | 1 refills | Status: DC

## 2020-04-28 DIAGNOSIS — Z20822 Contact with and (suspected) exposure to covid-19: Secondary | ICD-10-CM | POA: Diagnosis not present

## 2020-04-28 DIAGNOSIS — Z03818 Encounter for observation for suspected exposure to other biological agents ruled out: Secondary | ICD-10-CM | POA: Diagnosis not present

## 2020-06-02 ENCOUNTER — Ambulatory Visit: Payer: BC Managed Care – PPO | Admitting: Adult Health Nurse Practitioner

## 2020-06-13 NOTE — Progress Notes (Deleted)
FOLLOW UP 3 MONTH  Assessment and Plan:  Essential hypertension Fairly controlled at this time off of medications Monitor blood pressure at home; patient to call if consistently greater than 130/80 Continue DASH diet.   Reminder to go to the ER if any CP, SOB, nausea, dizziness, severe HA, changes vision/speech, left arm numbness and tingling and jaw pain.  Hyperlipademia Currently at goal on atorvastatin LDL goal <100 Continue low cholesterol diet and exercise.  Check lipid panel.   Other abnormal glucose Recent A1Cs normalized Continue diet and exercise.  Perform daily foot/skin check, notify office of any concerning changes.  Defer A1C; check CMP for serum glucose   Obesity BMI Long discussion about weight loss, diet, and exercise Recommended diet heavy in fruits and veggies and low in animal meats, cheeses, and dairy products, appropriate calorie intake Discussed ideal weight for height and initial weight goal (185 lb) Patient will work on continuing exercise, work on portion control  Will follow up in 3 months  Vitamin D Def Below goal at last visit; has restarted previous supplement continue supplementation to maintain goal of 70-100 Check Vit D level  Testosterone deficiency Continue to monitor, states medication is helping with symptoms of low T.  Suggested addition of zinc 50 mg daily, work on weight loss  Tobacco use Discussed risks associated with tobacco use and advised to reduce or quit Patient is not ready to do so, but advised to consider strongly Will follow up at the next visit  Corporo-venous occlusive erectile dysfunction  Medication management Continued      Continue diet and meds as discussed. Further disposition pending results of labs. Discussed med's effects and SE's.   Over 30 minutes of face to face interview, exam, counseling, non-face to face chart review, and critical decision making was performed.   Future Appointments  Date Time  Provider [place]  06/14/2020  8:45 AM Garnet Sierras, NP [facility] None  10/20/2020 11:00 AM Unk Pinto, MD [facility] None    ----------------------------------------------------------------------------------------------------------------------  HPI 44 y.o. male  presents for 3 month follow up on HTN, HLD, glucose management (hx of prediabetes), obesity, testosterone deficiency and vitamin D deficiency.   Difficulty falling asleep, takes melatonin which works well intermittently. Ongoing since childhood. Admits watching TV in bedroom prior to bedtime - discussed sleep hygiene.   he currently continues to smoke 1 pack a day; since 1989, 32 pack year history, discussed risks associated with smoking, patient is not ready to quit, but has quit in the past with chantix, might consider later this year.   BMI is There is no height or weight on file to calculate BMI., he has new job, more physically active, delivering uniforms, lifting and walking. Admits eating bad food, fast food, snacking, but cutting down on portions and stopping eating later.  Wt Readings from Last 3 Encounters:  02/18/20 190 lb 1.6 oz (86.2 kg)  11/05/19 194 lb 9.6 oz (88.3 kg)  08/06/19 200 lb (90.7 kg)   He BP is controlled at home, 120s/80s, today their BP is    He does not workout. He denies chest pain, shortness of breath, dizziness.   He is on cholesterol medication (atorvastatin 40 mg daily) and denies myalgias. His cholesterol is at goal. The cholesterol last visit was:   Lab Results  Component Value Date   CHOL 141 02/18/2020   HDL 39 (L) 02/18/2020   LDLCALC 79 02/18/2020   TRIG 133 02/18/2020   CHOLHDL 3.6 02/18/2020    He  has been working on diet and exercise for glucose management (hx of prediabetes), and denies increased appetite, nausea, paresthesia of the feet, polydipsia, polyuria, visual disturbances and vomiting. Last A1C in the office was:  Lab Results  Component Value Date    HGBA1C 5.1 02/18/2020   Patient is on Vitamin D supplement and below goal at recent check, was off of supplement, back taking 10000 IU which had him at goal previous:    Lab Results  Component Value Date   VD25OH 78 02/18/2020     He has a history of testosterone deficiency and is on testosterone replacement, taking 200 mg IM every 2 weeks. He states that the testosterone helps with his energy, libido, muscle mass. Lab Results  Component Value Date   TESTOSTERONE 1,240 (H) 02/18/2020      Current Medications:  Current Outpatient Medications on File Prior to Visit  Medication Sig  . acetaminophen (TYLENOL) 325 MG tablet Take 650 mg by mouth every 6 (six) hours as needed.  Marland Kitchen aspirin EC 81 MG tablet Take 81 mg by mouth daily.  Marland Kitchen atorvastatin (LIPITOR) 80 MG tablet TAKE 1 TABLET DAILY OR AS DIRECTED FOR CHOLESTEROL  . cholecalciferol (VITAMIN D) 1000 units tablet Take 10,000 Units by mouth daily.  . tadalafil (CIALIS) 20 MG tablet Take 1/2 to 1 tablet every 2 to 3 days as needed for XXXX  . testosterone cypionate (DEPOTESTOSTERONE CYPIONATE) 200 MG/ML injection Inject 1 ml     Into Muscle     every 7 days   No current facility-administered medications on file prior to visit.     Allergies: No Known Allergies   Medical History:  Past Medical History:  Diagnosis Date  . Hyperlipidemia   . Hypertension   . Hypogonadism male   . Obesity   . Prediabetes   . Vitamin D deficiency    Family history- Reviewed and unchanged Social history- Reviewed and unchanged   Review of Systems:  Review of Systems  Constitutional: Negative for chills, diaphoresis, fever, malaise/fatigue and weight loss.  HENT: Negative for congestion, ear discharge, ear pain, hearing loss, sinus pain, sore throat and tinnitus.   Eyes: Negative for blurred vision and double vision.  Respiratory: Negative for cough, sputum production, shortness of breath and wheezing.   Cardiovascular: Negative for chest pain,  palpitations, orthopnea, claudication and leg swelling.  Gastrointestinal: Negative for abdominal pain, blood in stool, constipation, diarrhea, heartburn, melena, nausea and vomiting.  Genitourinary: Negative.   Musculoskeletal: Negative for joint pain and myalgias.  Skin: Negative for rash.  Neurological: Negative for dizziness, tingling, sensory change, weakness and headaches.  Endo/Heme/Allergies: Negative for environmental allergies and polydipsia.  Psychiatric/Behavioral: Negative for depression, substance abuse and suicidal ideas. The patient has insomnia (chronic difficulty falling asleep). The patient is not nervous/anxious.   All other systems reviewed and are negative.     Physical Exam: There were no vitals taken for this visit. Wt Readings from Last 3 Encounters:  02/18/20 190 lb 1.6 oz (86.2 kg)  11/05/19 194 lb 9.6 oz (88.3 kg)  08/06/19 200 lb (90.7 kg)   General Appearance: Well nourished, in no apparent distress. Eyes: PERRLA, EOMs, conjunctiva no swelling or erythema Sinuses: No Frontal/maxillary tenderness ENT/Mouth: Ext aud canals clear, TMs without erythema, bulging. No erythema, swelling, or exudate on post pharynx.  Tonsils not swollen or erythematous. Hearing normal.  Neck: Supple, thyroid normal.  Respiratory: Respiratory effort normal, BS equal bilaterally without rales, rhonchi, wheezing or stridor.  Cardio: RRR with  no MRGs. Brisk peripheral pulses without edema.  Abdomen: Soft, + BS.  Non tender, no guarding, rebound, hernias, masses. Lymphatics: Non tender without lymphadenopathy.  Musculoskeletal: Full ROM, 5/5 strength, Normal gait Skin: Warm, dry without rashes, lesions, ecchymosis.  Neuro: Cranial nerves intact. No cerebellar symptoms.  Psych: Awake and oriented X 3, normal affect, Insight and Judgment appropriate.   Garnet Sierras, Laqueta Jean, DNP [facility] 06/13/2020  11:39 PM

## 2020-06-14 ENCOUNTER — Ambulatory Visit: Payer: BC Managed Care – PPO | Admitting: Adult Health Nurse Practitioner

## 2020-06-30 DIAGNOSIS — R509 Fever, unspecified: Secondary | ICD-10-CM | POA: Diagnosis not present

## 2020-06-30 DIAGNOSIS — U071 COVID-19: Secondary | ICD-10-CM | POA: Diagnosis not present

## 2020-07-05 DIAGNOSIS — Z9189 Other specified personal risk factors, not elsewhere classified: Secondary | ICD-10-CM | POA: Diagnosis not present

## 2020-07-05 DIAGNOSIS — U071 COVID-19: Secondary | ICD-10-CM | POA: Diagnosis not present

## 2020-07-06 ENCOUNTER — Other Ambulatory Visit: Payer: Self-pay | Admitting: Adult Health

## 2020-08-17 ENCOUNTER — Encounter: Payer: Self-pay | Admitting: Internal Medicine

## 2020-09-18 ENCOUNTER — Other Ambulatory Visit: Payer: Self-pay | Admitting: Internal Medicine

## 2020-09-18 DIAGNOSIS — E349 Endocrine disorder, unspecified: Secondary | ICD-10-CM

## 2020-10-20 ENCOUNTER — Encounter: Payer: Self-pay | Admitting: Internal Medicine

## 2020-11-12 ENCOUNTER — Other Ambulatory Visit: Payer: Self-pay | Admitting: Internal Medicine

## 2020-11-12 DIAGNOSIS — N5202 Corporo-venous occlusive erectile dysfunction: Secondary | ICD-10-CM

## 2020-11-12 MED ORDER — TADALAFIL 20 MG PO TABS: ORAL_TABLET | ORAL | 0 refills | Status: DC

## 2020-11-15 ENCOUNTER — Other Ambulatory Visit: Payer: Self-pay | Admitting: Internal Medicine

## 2020-11-15 DIAGNOSIS — N5202 Corporo-venous occlusive erectile dysfunction: Secondary | ICD-10-CM

## 2020-11-15 MED ORDER — TADALAFIL 20 MG PO TABS: ORAL_TABLET | ORAL | 0 refills | Status: DC

## 2020-12-20 ENCOUNTER — Ambulatory Visit (INDEPENDENT_AMBULATORY_CARE_PROVIDER_SITE_OTHER): Payer: BC Managed Care – PPO | Admitting: Internal Medicine

## 2020-12-20 ENCOUNTER — Other Ambulatory Visit: Payer: Self-pay

## 2020-12-20 ENCOUNTER — Encounter: Payer: Self-pay | Admitting: Internal Medicine

## 2020-12-20 VITALS — BP 110/74 | HR 67 | Temp 97.7°F | Resp 17 | Ht 69.0 in | Wt 189.9 lb

## 2020-12-20 DIAGNOSIS — Z111 Encounter for screening for respiratory tuberculosis: Secondary | ICD-10-CM | POA: Diagnosis not present

## 2020-12-20 DIAGNOSIS — N401 Enlarged prostate with lower urinary tract symptoms: Secondary | ICD-10-CM | POA: Diagnosis not present

## 2020-12-20 DIAGNOSIS — Z130 Encounter for screening for diseases of the blood and blood-forming organs and certain disorders involving the immune mechanism: Secondary | ICD-10-CM

## 2020-12-20 DIAGNOSIS — Z1389 Encounter for screening for other disorder: Secondary | ICD-10-CM

## 2020-12-20 DIAGNOSIS — E559 Vitamin D deficiency, unspecified: Secondary | ICD-10-CM | POA: Diagnosis not present

## 2020-12-20 DIAGNOSIS — Z1211 Encounter for screening for malignant neoplasm of colon: Secondary | ICD-10-CM

## 2020-12-20 DIAGNOSIS — Z125 Encounter for screening for malignant neoplasm of prostate: Secondary | ICD-10-CM

## 2020-12-20 DIAGNOSIS — E782 Mixed hyperlipidemia: Secondary | ICD-10-CM

## 2020-12-20 DIAGNOSIS — Z8249 Family history of ischemic heart disease and other diseases of the circulatory system: Secondary | ICD-10-CM | POA: Diagnosis not present

## 2020-12-20 DIAGNOSIS — Z136 Encounter for screening for cardiovascular disorders: Secondary | ICD-10-CM

## 2020-12-20 DIAGNOSIS — Z1329 Encounter for screening for other suspected endocrine disorder: Secondary | ICD-10-CM

## 2020-12-20 DIAGNOSIS — Z0000 Encounter for general adult medical examination without abnormal findings: Secondary | ICD-10-CM

## 2020-12-20 DIAGNOSIS — Z0001 Encounter for general adult medical examination with abnormal findings: Secondary | ICD-10-CM

## 2020-12-20 DIAGNOSIS — Z13220 Encounter for screening for lipoid disorders: Secondary | ICD-10-CM

## 2020-12-20 DIAGNOSIS — Z131 Encounter for screening for diabetes mellitus: Secondary | ICD-10-CM | POA: Diagnosis not present

## 2020-12-20 DIAGNOSIS — Z79899 Other long term (current) drug therapy: Secondary | ICD-10-CM | POA: Diagnosis not present

## 2020-12-20 DIAGNOSIS — E349 Endocrine disorder, unspecified: Secondary | ICD-10-CM

## 2020-12-20 DIAGNOSIS — R0989 Other specified symptoms and signs involving the circulatory and respiratory systems: Secondary | ICD-10-CM

## 2020-12-20 DIAGNOSIS — R350 Frequency of micturition: Secondary | ICD-10-CM

## 2020-12-20 DIAGNOSIS — R7309 Other abnormal glucose: Secondary | ICD-10-CM

## 2020-12-20 DIAGNOSIS — R5383 Other fatigue: Secondary | ICD-10-CM

## 2020-12-20 NOTE — Progress Notes (Signed)
Annual  Screening/Preventative Visit  & Comprehensive Evaluation & Examination  Future Appointments  Date Time Provider [place]  12/20/2020 11:00 AM Unk Pinto, MD [facility] None  12/20/2021 11:00 AM Unk Pinto, MD [facility] None            This very nice 45 y.o. MWM presents for a Screening /Preventative Visit & comprehensive evaluation and management of multiple medical co-morbidities.  Patient has been followed for labile HTN, HLD, Prediabetes and Vitamin D Deficiency.       Labile HTN predates since 2007 now followed by active surveillance.  Patient's BP has been controlled at home.  Today's BP is at goal - 110/74. Patient denies any cardiac symptoms as chest pain, palpitations, shortness of breath, dizziness or ankle swelling.       Patient's hyperlipidemia is controlled with diet and Atorvastatin. Patient denies myalgias or other medication SE's. Last lipids were at goal:  Lab Results  Component Value Date   CHOL 141 02/18/2020   HDL 39 (L) 02/18/2020   LDLCALC 79 02/18/2020   TRIG 133 02/18/2020   CHOLHDL 3.6 02/18/2020                                                          Patient has hx/o Testosterone Deficiency ("218" /2012 and "201"/2014) and is on Depo-Testosterone injections with improved stamina & sense of well-being.         Patient has hx/o prediabetes (A1c 5.7% /2015) and patient denies reactive hypoglycemic symptoms, visual blurring, diabetic polys or paresthesias. Last A1c was normal  at goal:   Lab Results  Component Value Date   HGBA1C 5.1 02/18/2020          Finally, patient has history of Vitamin D Deficiency ("13" /2008) and last vitamin D was at goal:   Lab Results  Component Value Date   VD25OH 78 02/18/2020    Current Outpatient Medications on File Prior to Visit  Medication Sig   acetaminophen   325 MG tablet Take 650 mg every 6  hours as needed.   aspirin EC 81 MG tablet Take daily.   atorvastatin  80 MG tablet  TAKE 1 TABLET DAILY    VITAMIN D  1000 units tablet Take 10,000 Units daily.   tadalafil 20 MG tablet Take 1/2 to 1 tablet every 2 to 3 days as needed    testosterone cypio 200 MG/ML injec INJECT 1 ml  IM EVERY 7 DAYS    No Known Allergies   Past Medical History:  Diagnosis Date   Hyperlipidemia    Hypertension    Hypogonadism male    Obesity    Prediabetes    Vitamin D deficiency      Health Maintenance  Topic Date Due   Pneumococcal Vaccine 58-66 Years old (1 - PCV) Never done   COLONOSCOPY (Pts 45-69yrs Insurance coverage will need to be confirmed)  Never done   COVID-19 Vaccine (3 - Pfizer risk series) 11/26/2019   INFLUENZA VACCINE  01/24/2021   TETANUS/TDAP  12/04/2021   Hepatitis C Screening  Completed   HIV Screening  Completed   HPV VACCINES  Aged Out     Immunization History  Administered Date(s) Administered   Influenza Split 03/31/2014, 04/14/2015   PFIZER SARS-COV-2 Vacc 09/28/2019, 10/29/2019   PPD Test  01/25/2016, 04/16/2017, 08/06/2019   Pneumococcal-23 12/05/2011   Tdap 12/05/2011    Past Surgical History:  Procedure Laterality Date   CERVICAL FUSION      Family History  Problem Relation Age of Onset   Hyperlipidemia Mother    Hyperlipidemia Father    Cancer Paternal Grandfather        Lung    Social History   Socioeconomic History   Marital status: Married    Spouse name: Caren Griffins   Number of children: 1 son  &   1 daughter  Occupational History   Works Engineer, drilling - delivery   Tobacco Use   Smoking status: Every Day    Packs/day: 1.00    Years: 32.00    Pack years: 32.00    Types: Cigarettes    Start date: 1989   Smokeless tobacco: Never  Substance and Sexual Activity   Alcohol use: Yes    Alcohol/week: 4.0 standard drinks    Types: 4 Standard drinks or equivalent per week    Comment: moderate   Drug use: Yes    Frequency: 2.0 times per week    Types: Marijuana   Sexual activity: Not on file       ROS Constitutional: Denies fever, chills, weight loss/gain, headaches, insomnia,  night sweats or change in appetite. Does c/o fatigue. Eyes: Denies redness, blurred vision, diplopia, discharge, itchy or watery eyes.  ENT: Denies discharge, congestion, post nasal drip, epistaxis, sore throat, earache, hearing loss, dental pain, Tinnitus, Vertigo, Sinus pain or snoring.  Cardio: Denies chest pain, palpitations, irregular heartbeat, syncope, dyspnea, diaphoresis, orthopnea, PND, claudication or edema Respiratory: denies cough, dyspnea, DOE, pleurisy, hoarseness, laryngitis or wheezing.  Gastrointestinal: Denies dysphagia, heartburn, reflux, water brash, pain, cramps, nausea, vomiting, bloating, diarrhea, constipation, hematemesis, melena, hematochezia, jaundice or hemorrhoids Genitourinary: Denies dysuria, frequency, urgency, nocturia, hesitancy, discharge, hematuria or flank pain Musculoskeletal: Denies arthralgia, myalgia, stiffness, Jt. Swelling, pain, limp or strain/sprain. Denies Falls. Skin: Denies puritis, rash, hives, warts, acne, eczema or change in skin lesion Neuro: No weakness, tremor, incoordination, spasms, paresthesia or pain Psychiatric: Denies confusion, memory loss or sensory loss. Denies Depression. Endocrine: Denies change in weight, skin, hair change, nocturia, and paresthesia, diabetic polys, visual blurring or hyper / hypo glycemic episodes.  Heme/Lymph: No excessive bleeding, bruising or enlarged lymph nodes.   Physical Exam  BP 110/74   Pulse 67   Temp 97.7 F (36.5 C)   Resp 17   Ht 5\' 9"  (1.753 m)   Wt 189 lb 14.4 oz (86.1 kg)   SpO2 97%   BMI 28.04 kg/m   General Appearance: Well nourished and well groomed and in no apparent distress.  Eyes: PERRLA, EOMs, conjunctiva no swelling or erythema, normal fundi and vessels. Sinuses: No frontal/maxillary tenderness ENT/Mouth: EACs patent / TMs  nl. Nares clear without erythema, swelling, mucoid exudates. Oral  hygiene is good. No erythema, swelling, or exudate. Tongue normal, non-obstructing. Tonsils not swollen or erythematous. Hearing normal.  Neck: Supple, thyroid not palpable. No bruits, nodes or JVD. Respiratory: Respiratory effort normal.  BS equal and clear bilateral without rales, rhonci, wheezing or stridor. Cardio: Heart sounds are normal with regular rate and rhythm and no murmurs, rubs or gallops. Peripheral pulses are normal and equal bilaterally without edema. No aortic or femoral bruits. Chest: symmetric with normal excursions and percussion.  Abdomen: Soft, with Nl bowel sounds. Nontender, no guarding, rebound, hernias, masses, or organomegaly.  Lymphatics: Non tender without lymphadenopathy.  Musculoskeletal: Full ROM all peripheral  extremities, joint stability, 5/5 strength, and normal gait. Skin: Warm and dry without rashes, lesions, cyanosis, clubbing or  ecchymosis.  Neuro: Cranial nerves intact, reflexes equal bilaterally. Normal muscle tone, no cerebellar symptoms. Sensation intact.  Pysch: Alert and oriented X 3 with normal affect, insight and judgment appropriate.   Assessment and Plan  1. Annual Preventative/Screening Exam    2. Labile hypertension  - EKG 12-Lead - Urinalysis, Routine w reflex microscopic - Microalbumin / creatinine urine ratio - CBC with Differential/Platelet - COMPLETE METABOLIC PANEL WITH GFR - Magnesium - TSH  3. Hyperlipidemia, mixed  - Lipid panel - TSH  4. Abnormal glucose  - Insulin, random  5. Vitamin D deficiency  - VITAMIN D 25 Hydroxy   6. Testosterone deficiency  - Testosterone  7. Screening for colorectal cancer  - POC Hemoccult Bld/Stl   8. Prostate cancer screening  - PSA  9. Screening-pulmonary TB  - TB Skin Test  10. Screening for ischemic heart disease  - EKG 12-Lead  11. FH: hypertension  - EKG 12-Lead  12. Fatigue  - Iron, Total/Total Iron Binding Cap - Vitamin B12 - Testosterone - CBC with  Differential/Platelet - TSH  13. Medication management  - Urinalysis, Routine w reflex microscopic - Microalbumin / creatinine urine ratio - Testosterone - TB Skin Test - CBC with Differential/Platelet - COMPLETE METABOLIC PANEL WITH GFR - Magnesium - Lipid panel - TSH - Hemoglobin A1c        Patient was counseled in prudent diet, weight control to achieve/maintain BMI less than 25, BP monitoring, regular exercise and medications as discussed.  Discussed med effects and SE's. Routine screening labs and tests as requested with regular follow-up as recommended. Over 40 minutes of exam, counseling, chart review and high complex critical decision making was performed   Kirtland Bouchard, MD

## 2020-12-20 NOTE — Patient Instructions (Signed)

## 2020-12-21 LAB — URINALYSIS, ROUTINE W REFLEX MICROSCOPIC
Bilirubin Urine: NEGATIVE
Glucose, UA: NEGATIVE
Hgb urine dipstick: NEGATIVE
Ketones, ur: NEGATIVE
Leukocytes,Ua: NEGATIVE
Nitrite: NEGATIVE
Protein, ur: NEGATIVE
Specific Gravity, Urine: 1.01 (ref 1.001–1.035)
pH: 7.5 (ref 5.0–8.0)

## 2020-12-21 LAB — CBC WITH DIFFERENTIAL/PLATELET
Absolute Monocytes: 583 cells/uL (ref 200–950)
Basophils Absolute: 47 cells/uL (ref 0–200)
Basophils Relative: 0.7 %
Eosinophils Absolute: 101 cells/uL (ref 15–500)
Eosinophils Relative: 1.5 %
HCT: 55.7 % — ABNORMAL HIGH (ref 38.5–50.0)
Hemoglobin: 19.1 g/dL — ABNORMAL HIGH (ref 13.2–17.1)
Lymphs Abs: 1742 cells/uL (ref 850–3900)
MCH: 31.1 pg (ref 27.0–33.0)
MCHC: 34.3 g/dL (ref 32.0–36.0)
MCV: 90.7 fL (ref 80.0–100.0)
MPV: 11.6 fL (ref 7.5–12.5)
Monocytes Relative: 8.7 %
Neutro Abs: 4228 cells/uL (ref 1500–7800)
Neutrophils Relative %: 63.1 %
Platelets: 223 10*3/uL (ref 140–400)
RBC: 6.14 10*6/uL — ABNORMAL HIGH (ref 4.20–5.80)
RDW: 12.6 % (ref 11.0–15.0)
Total Lymphocyte: 26 %
WBC: 6.7 10*3/uL (ref 3.8–10.8)

## 2020-12-21 LAB — COMPLETE METABOLIC PANEL WITH GFR
AG Ratio: 1.8 (calc) (ref 1.0–2.5)
ALT: 26 U/L (ref 9–46)
AST: 23 U/L (ref 10–40)
Albumin: 4.6 g/dL (ref 3.6–5.1)
Alkaline phosphatase (APISO): 86 U/L (ref 36–130)
BUN: 11 mg/dL (ref 7–25)
CO2: 27 mmol/L (ref 20–32)
Calcium: 10.1 mg/dL (ref 8.6–10.3)
Chloride: 103 mmol/L (ref 98–110)
Creat: 0.89 mg/dL (ref 0.60–1.35)
GFR, Est African American: 118 mL/min/{1.73_m2} (ref >=60)
GFR, Est Non African American: 102 mL/min/{1.73_m2} (ref >=60)
Globulin: 2.6 g/dL (calc) (ref 1.9–3.7)
Glucose, Bld: 84 mg/dL (ref 65–99)
Potassium: 4.7 mmol/L (ref 3.5–5.3)
Sodium: 138 mmol/L (ref 135–146)
Total Bilirubin: 0.9 mg/dL (ref 0.2–1.2)
Total Protein: 7.2 g/dL (ref 6.1–8.1)

## 2020-12-21 LAB — TSH: TSH: 1.47 mIU/L (ref 0.40–4.50)

## 2020-12-21 LAB — LIPID PANEL
Cholesterol: 182 mg/dL (ref <200)
HDL: 43 mg/dL (ref >=40)
LDL Cholesterol (Calc): 106 mg/dL (calc) — ABNORMAL HIGH
Non-HDL Cholesterol (Calc): 139 mg/dL (calc) — ABNORMAL HIGH (ref <130)
Total CHOL/HDL Ratio: 4.2 (calc) (ref <5.0)
Triglycerides: 212 mg/dL — ABNORMAL HIGH (ref <150)

## 2020-12-21 LAB — MAGNESIUM: Magnesium: 2.1 mg/dL (ref 1.5–2.5)

## 2020-12-21 LAB — TESTOSTERONE: Testosterone: 617 ng/dL (ref 250–827)

## 2020-12-21 LAB — HEMOGLOBIN A1C
Hgb A1c MFr Bld: 4.7 % of total Hgb (ref <5.7)
Mean Plasma Glucose: 88 mg/dL
eAG (mmol/L): 4.9 mmol/L

## 2020-12-21 LAB — MICROALBUMIN / CREATININE URINE RATIO
Creatinine, Urine: 67 mg/dL (ref 20–320)
Microalb Creat Ratio: 4 mcg/mg creat (ref <30)
Microalb, Ur: 0.3 mg/dL

## 2020-12-21 LAB — PSA: PSA: 1.78 ng/mL (ref <=4.00)

## 2020-12-21 LAB — VITAMIN D 25 HYDROXY (VIT D DEFICIENCY, FRACTURES): Vit D, 25-Hydroxy: 80 ng/mL (ref 30–100)

## 2020-12-21 LAB — IRON, TOTAL/TOTAL IRON BINDING CAP
%SAT: 55 % (calc) — ABNORMAL HIGH (ref 20–48)
Iron: 183 ug/dL — ABNORMAL HIGH (ref 50–180)
TIBC: 334 mcg/dL (calc) (ref 250–425)

## 2020-12-21 LAB — INSULIN, RANDOM: Insulin: 8 u[IU]/mL

## 2020-12-21 LAB — VITAMIN B12: Vitamin B-12: 499 pg/mL (ref 200–1100)

## 2020-12-21 NOTE — Progress Notes (Signed)
============================================================ - Test results slightly outside the reference range are not unusual. If there is anything important, I will review this with you,  otherwise it is considered normal test values.  If you have further questions,  please do not hesitate to contact me at the office or via My Chart.  ============================================================ ============================================================  -  Iron is a little elevated , but K  - and -  Vitamin B12 = 499  = Great  ============================================================ ============================================================  -  PSA - Low - Great  ============================================================ ============================================================  -  Testosterone - Normal ============================================================ ============================================================  -  Total Chol = 182 - is OK   - But LDL Chol = 106 is considered too high - Ideal or goal is less than 70 !  - So, recommend  - Cholesterol is too high - Recommend a stricter                                                                                         low cholesterol diet   - Cholesterol only comes from animal sources  - ie. meat, dairy, egg yolks  - Eat all the vegetables you want.  - Avoid meat, especially red meat - Beef AND Pork .  - Avoid cheese & dairy - milk & ice cream.     - Cheese is the most concentrated form of trans-fats which  is the worst thing to clog up our arteries.   - Veggie cheese is OK which can be found in the fresh  produce section at [employer] or [employer] or [employer] ============================================================ ============================================================  -  A1c = 4.7% - Low - No Diabetes - Great  ! ============================================================ ============================================================  -  All Else - CBC - Kidneys - Electrolytes - Liver - Magnesium & Thyroid    - all  Normal / OK ============================================================ ============================================================  -              ============================================================ - Test results slightly outside the reference range are not unusual. If there is anything important, I will review this with you,  otherwise it is considered normal test values.  If you have further questions,  please do not hesitate to contact me at the office or via My Chart.  ============================================================ ============================================================  -  Iron borderline elevated, but OK  ( main concern or worry is if it's Low)   - Vitamin B12 -  Normal - Great  ============================================================ ============================================================  -  PSA - Low - Great  ============================================================ ============================================================  -  Testosterone - Normal ============================================================ ============================================================  -  Total Chol = 182 - Great   - But   - LDL Chol = 106 - is too high        (  Ideal or Goal is less than 70  !  )   - So  - Recommend a stricter  low cholesterol diet   - Cholesterol only comes from animal sources  - ie. meat, dairy, egg yolks  - Eat all the vegetables you want.  - Avoid meat, especially red meat - Beef AND Pork .  - Avoid cheese & dairy - milk &  ice cream.     - Cheese is the most concentrated form of trans-fats which  is the worst thing to clog up our arteries.   - Veggie cheese is OK which can be found in  the fresh  produce section at [employer] or [employer] or [employer] ============================================================ ============================================================  -  Also, Triglycerides (   212   ) or fats in blood are too high  (goal is less than 150)    - Recommend avoid fried & greasy foods,  sweets / candy,   - Avoid white rice  (brown or wild rice or Quinoa is OK),   - Avoid white potatoes  (sweet potatoes are OK)   - Avoid anything made from white flour  - bagels, doughnuts, rolls, buns, biscuits, white and   wheat breads, pizza crust and traditional  pasta made of white flour & egg white  - (vegetarian pasta or spinach or wheat pasta is OK).    - Multi-grain bread is OK - like multi-grain flat bread or  sandwich thins.   - Avoid alcohol in excess.   - Exercise is also important. ============================================================ ============================================================  -  A1c - Normal -Great - No Diabetes ============================================================ ============================================================  -  Vitamin D = 80 - Excellent  ============================================================ ============================================================  -  All Else - CBC - Kidneys - Electrolytes - Liver - Magnesium & Thyroid    - all  Normal / OK ============================================================ ============================================================  -  Keep up the [place] Work ! ============================================================ ============================================================

## 2021-01-09 ENCOUNTER — Other Ambulatory Visit: Payer: Self-pay | Admitting: Adult Health

## 2021-01-17 ENCOUNTER — Ambulatory Visit (INDEPENDENT_AMBULATORY_CARE_PROVIDER_SITE_OTHER): Payer: BC Managed Care – PPO | Admitting: Internal Medicine

## 2021-01-17 ENCOUNTER — Other Ambulatory Visit: Payer: Self-pay

## 2021-01-17 VITALS — BP 126/70 | HR 68 | Temp 98.0°F | Resp 16 | Ht 69.0 in | Wt 196.0 lb

## 2021-01-17 DIAGNOSIS — R0989 Other specified symptoms and signs involving the circulatory and respiratory systems: Secondary | ICD-10-CM

## 2021-01-17 DIAGNOSIS — L723 Sebaceous cyst: Secondary | ICD-10-CM | POA: Diagnosis not present

## 2021-01-17 NOTE — Progress Notes (Signed)
    Future Appointments  Date Time Provider [place]  07/04/2021  9:30 AM Magda Bernheim, NP [facility] None  12/20/2021 11:00 AM Unk Pinto, MD [facility] None    History of Present Illness:     Very nice 45 yo MWM with hx/o labile elevated BP 's who presents for check-up and HT systems review is negative. He also has concerns re: a "lump" of his Left upper chest which is tender and seems to be increasing in size.    Medications    testosterone cypionate 200 MG/ML injection, INJECT 1 ML IM EVERY 7 DAYS   atorvastatin 80 MG tablet, Take  1 tablet  Daily  for Cholesterol   tadalafil  20 MG tablet, Take 1/2 to 1 tablet every 2 to 3 days as needed   acetaminophen  325 MG tablet, Take 650 mg  every 6 hours as needed.   aspirin EC 81 MG tablet, Take  daily.   VITAMIN D  Take 10,000 Units by mouth daily.  Problem list He has Other abnormal glucose; Testosterone Deficiency; Obesity (BMI 30.0-34.9); Essential hypertension; Hyperlipidemia; Vitamin D deficiency; Medication management; and Smoking on their problem list.   Observations/Objective:  BP 126/70   Pulse 68   Temp 98 F (36.7 C)   Resp 16   Ht '5\' 9"'$  (1.753 m)   Wt 196 lb (88.9 kg)   SpO2 97%   BMI 28.94 kg/m   PE WNL - Chest clear . Cor- RRR w/o M. Abd - benign.   There is a soft 1.5  x 1 cm movable sub-cutaneous "mass" of the Left upper chest.  Procedure ( CPT  : 11401)     After informed consent and aseptic prep with alcohol,  approx 4 ml of Marcaine 0.5% was infiltrated around the lesion. Then with a #10 scalpel, a 1" transverse incision was made to expose the lesion. Then the sebaceous cyst was sharply excised and delivered. Then the wound edges were approximated  with # 3 vertical mattress sutures of Proline 3-0. Then a running lock  stitch  of Proline 3-0 x 4 was applied to align and secure the wound . Antibiotic ung and Tegaderm applied. Patient instructed in wound care.  Assessment and Plan:  1.  Labile hypertension  2. Sebaceous cyst  - advised ROV 10 days for suture removal   Follow Up Instructions:        I discussed the assessment and treatment plan with the patient. The patient was provided an opportunity to ask questions and all were answered. The patient agreed with the plan and demonstrated an understanding of the instructions.       The patient was advised to call back or seek an in-person evaluation if the symptoms worsen or if the condition fails to improve as anticipated.    Kirtland Bouchard, MD

## 2021-01-27 ENCOUNTER — Ambulatory Visit: Payer: BC Managed Care – PPO | Admitting: Adult Health

## 2021-01-27 ENCOUNTER — Other Ambulatory Visit: Payer: Self-pay

## 2021-01-27 ENCOUNTER — Encounter: Payer: Self-pay | Admitting: Internal Medicine

## 2021-01-27 VITALS — BP 132/78 | HR 71 | Temp 98.0°F | Resp 16 | Ht 69.0 in | Wt 191.2 lb

## 2021-01-27 DIAGNOSIS — Z4802 Encounter for removal of sutures: Secondary | ICD-10-CM | POA: Diagnosis not present

## 2021-01-27 DIAGNOSIS — L723 Sebaceous cyst: Secondary | ICD-10-CM

## 2021-01-27 DIAGNOSIS — I10 Essential (primary) hypertension: Secondary | ICD-10-CM | POA: Diagnosis not present

## 2021-01-27 NOTE — Progress Notes (Signed)
Assessment and Plan:  Neil Henderson was seen today for suture / staple removal.  Diagnoses and all orders for this visit:  Sebaceous cyst Visit for suture removal Well healed removal site. Sutures x 4 removed.  No signs of infection or complication. Follow up if any concern.   Essential hypertension Continue medications Monitor blood pressure at home; call if consistently over 130/80 Continue DASH diet.   Reminder to go to the ER if any CP, SOB, nausea, dizziness, severe HA, changes vision/speech, left arm numbness and tingling and jaw pain.  Further disposition pending results of labs. Discussed med's effects and SE's.   Over 15 minutes of exam, counseling, chart review, and critical decision making was performed.   Future Appointments  Date Time Provider [place]  07/04/2021  9:30 AM Magda Bernheim, NP [facility] None  12/20/2021 11:00 AM Unk Pinto, MD [facility] None    ------------------------------------------------------------------------------------------------------------------   HPI BP (!) 151/84   Pulse 71   Temp 98 F (36.7 C)   Resp 16   Ht '5\' 9"'$  (1.753 m)   Wt 191 lb 3.2 oz (86.7 kg)   SpO2 97%   BMI 28.24 kg/m  45 y.o.male presents for 10 day follow up on sebaceous cyst removal by Dr. Melford Aase on 01/17/2021. He denies discharge, pain, fever/chills or other concerns.   BP initially elevated today, improved on recheck:  BP: 132/78 He denies chest pain, shortness of breath, dizziness.   Past Medical History:  Diagnosis Date   Hyperlipidemia    Hypertension    Hypogonadism male    Obesity    Prediabetes    Vitamin D deficiency      No Known Allergies  Current Outpatient Medications on File Prior to Visit  Medication Sig   acetaminophen (TYLENOL) 325 MG tablet Take 650 mg by mouth every 6 (six) hours as needed.   aspirin EC 81 MG tablet Take 81 mg by mouth daily.   atorvastatin (LIPITOR) 80 MG tablet Take  1 tablet  Daily  for Cholesterol    cholecalciferol (VITAMIN D) 1000 units tablet Take 10,000 Units by mouth daily.   tadalafil (CIALIS) 20 MG tablet Take 1/2 to 1 tablet every 2 to 3 days as needed for XXXX   testosterone cypionate (DEPOTESTOSTERONE CYPIONATE) 200 MG/ML injection INJECT 1 ML INTO MUSCLE EVERY 7 DAYS   No current facility-administered medications on file prior to visit.    ROS: all negative except above.   Physical Exam:  BP (!) 151/84   Pulse 71   Temp 98 F (36.7 C)   Resp 16   Ht '5\' 9"'$  (1.753 m)   Wt 191 lb 3.2 oz (86.7 kg)   SpO2 97%   BMI 28.24 kg/m   General Appearance: Well nourished, in no apparent distress. Eyes: conjunctiva no swelling or erythema ENT/Mouth: Mask in place; Hearing normal.  Neck: Supple Respiratory: Respiratory effort normal Cardio: Appears well perfused Musculoskeletal: normal gait.  Skin: Warm, dry without rashes, lesions, ecchymosis. Well approximated and healing wound 1.5 cm to left chest. Sutures x 4 removed.  Neuro: Normal muscle tone Psych: Awake and oriented X 3, normal affect, Insight and Judgment appropriate.     Izora Ribas, NP 5:16 PM [facility]

## 2021-06-30 NOTE — Progress Notes (Signed)
FOLLOW UP  Assessment and Plan:   Hypertension Fairly controlled at this time off of medications Monitor blood pressure at home; patient to call if consistently greater than 130/80 Continue DASH diet.   Reminder to go to the ER if any CP, SOB, nausea, dizziness, severe HA, changes vision/speech, left arm numbness and tingling and jaw pain. Check CBC  Cholesterol Currently at goal on atorvastatin LDL goal <100 Continue low cholesterol diet and exercise.  Check lipid panel.  Check CMP, TSH  Other abnormal glucose Recent A1Cs normalized Continue diet and exercise.  Perform daily foot/skin check, notify office of any concerning changes.  Check A1c   Overweight Long discussion about weight loss, diet, and exercise Recommended diet heavy in fruits and veggies and low in animal meats, cheeses, and dairy products, appropriate calorie intake Discussed ideal weight for height and initial weight goal (185 lb) Patient will work on continuing exercise, work on portion control  Will follow up in 3 months  Vitamin D Def Below goal at last visit; has restarted previous supplement continue supplementation to maintain goal of 70-100 Check Vit D level  Testosterone deficiency Continue to monitor, states medication is helping with symptoms of low T.  Suggested addition of zinc 50 mg daily, work on weight loss  Tobacco use Discussed risks associated with tobacco use and advised to reduce or quit Patient is not ready to do so, but advised to consider strongly Will follow up at the next visit  Flu Vaccine need Flu vaccine QUAD 6+ MOS PF IM given   Continue diet and meds as discussed. Further disposition pending results of labs. Discussed med's effects and SE's.   Over 30 minutes of exam, counseling, chart review, and critical decision making was performed.   Future Appointments  Date Time Provider [place]  12/20/2021 11:00 AM Unk Pinto, MD [facility] None     ----------------------------------------------------------------------------------------------------------------------  HPI 46 y.o. male  presents for 3 month follow up on hypertension, cholesterol, glucose management (hx of prediabetes), obesity, testosterone deficiency and vitamin D deficiency.   Difficulty falling asleep, takes melatonin which works well intermittently. Ongoing since childhood. Admits watching TV in bedroom prior to bedtime - discussed sleep hygiene.   he currently continues to smoke 1- 11/2 pack a day; since 1989, 32 pack year history, discussed risks associated with smoking, patient is not ready to quit. Will consider in the future, father and sister had lung cancer 02/2021 and 04/2021  BMI is Body mass index is 29.42 kg/m., he has new job, more physically active, delivering uniforms, lifting and walking. Admits eating bad food, fast food, snacking, but cutting down on portions and stopping eating later.  Wt Readings from Last 3 Encounters:  07/04/21 199 lb 3.2 oz (90.4 kg)  01/27/21 191 lb 3.2 oz (86.7 kg)  01/17/21 196 lb (88.9 kg)   He BP is controlled at home, 120s/80s, today their BP is BP: 120/62 BP Readings from Last 3 Encounters:  07/04/21 120/62  01/27/21 132/78  01/17/21 126/70     He does not workout. He denies chest pain, shortness of breath, dizziness.   He is on cholesterol medication (atorvastatin 80 mg daily) and denies myalgias. His cholesterol is at goal. The cholesterol last visit was:   Lab Results  Component Value Date   CHOL 182 12/20/2020   HDL 43 12/20/2020   LDLCALC 106 (H) 12/20/2020   TRIG 212 (H) 12/20/2020   CHOLHDL 4.2 12/20/2020    He has been working on diet  and exercise for glucose management (hx of prediabetes), and denies increased appetite, nausea, paresthesia of the feet, polydipsia, polyuria, visual disturbances and vomiting. Last A1C in the office was:  Lab Results  Component Value Date   HGBA1C 4.7 12/20/2020    Patient is on Vitamin D supplement and below goal at recent check, was off of supplement, back taking 10000 IU which had him at goal previous:    Lab Results  Component Value Date   VD25OH 70 12/20/2020     He has a history of testosterone deficiency and is on testosterone replacement, taking 200 mg IM every 2 weeks. He states that the testosterone helps with his energy, libido, muscle mass. Lab Results  Component Value Date   TESTOSTERONE 617 12/20/2020      Current Medications:  Current Outpatient Medications on File Prior to Visit  Medication Sig   acetaminophen (TYLENOL) 325 MG tablet Take 650 mg by mouth every 6 (six) hours as needed.   aspirin EC 81 MG tablet Take 81 mg by mouth daily.   atorvastatin (LIPITOR) 80 MG tablet Take  1 tablet  Daily  for Cholesterol   cholecalciferol (VITAMIN D) 1000 units tablet Take 10,000 Units by mouth daily.   tadalafil (CIALIS) 20 MG tablet Take 1/2 to 1 tablet every 2 to 3 days as needed for XXXX   testosterone cypionate (DEPOTESTOSTERONE CYPIONATE) 200 MG/ML injection INJECT 1 ML INTO MUSCLE EVERY 7 DAYS   No current facility-administered medications on file prior to visit.     Allergies: No Known Allergies   Medical History:  Past Medical History:  Diagnosis Date   Hyperlipidemia    Hypertension    Hypogonadism male    Obesity    Prediabetes    Vitamin D deficiency    Family history- Reviewed and unchanged Social history- Reviewed and unchanged   Review of Systems:  Review of Systems  Constitutional:  Negative for chills, diaphoresis, fever, malaise/fatigue and weight loss.  HENT:  Negative for congestion, ear discharge, ear pain, hearing loss, sinus pain, sore throat and tinnitus.   Eyes:  Negative for blurred vision and double vision.  Respiratory:  Negative for cough, sputum production, shortness of breath and wheezing.   Cardiovascular:  Negative for chest pain, palpitations, orthopnea, claudication and leg swelling.   Gastrointestinal:  Negative for abdominal pain, blood in stool, constipation, diarrhea, heartburn, melena, nausea and vomiting.  Genitourinary: Negative.   Musculoskeletal:  Negative for joint pain and myalgias.  Skin:  Negative for rash.  Neurological:  Negative for dizziness, tingling, sensory change, weakness and headaches.  Endo/Heme/Allergies:  Negative for environmental allergies and polydipsia.  Psychiatric/Behavioral:  Negative for depression, substance abuse and suicidal ideas. The patient is not nervous/anxious and does not have insomnia.   All other systems reviewed and are negative.    Physical Exam: BP 120/62    Pulse 70    Temp 97.7 F (36.5 C)    Wt 199 lb 3.2 oz (90.4 kg)    SpO2 98%    BMI 29.42 kg/m  Wt Readings from Last 3 Encounters:  07/04/21 199 lb 3.2 oz (90.4 kg)  01/27/21 191 lb 3.2 oz (86.7 kg)  01/17/21 196 lb (88.9 kg)   General Appearance: Well nourished, in no apparent distress. Eyes: PERRLA, EOMs, conjunctiva no swelling or erythema Sinuses: No Frontal/maxillary tenderness ENT/Mouth: Ext aud canals clear, TMs without erythema, bulging. No erythema, swelling, or exudate on post pharynx.  Tonsils not swollen or erythematous. Hearing normal.  Neck: Supple, thyroid normal.  Respiratory: Respiratory effort normal, BS equal bilaterally without rales, rhonchi, wheezing or stridor.  Cardio: RRR with no MRGs. Brisk peripheral pulses without edema.  Abdomen: Soft, + BS.  Non tender, no guarding, rebound, hernias, masses. Lymphatics: Non tender without lymphadenopathy.  Musculoskeletal: Full ROM, 5/5 strength, Normal gait Skin: Warm, dry without rashes, lesions, ecchymosis.  Neuro: Cranial nerves intact. No cerebellar symptoms.  Psych: Awake and oriented X 3, normal affect, Insight and Judgment appropriate.    Magda Bernheim, NP 9:37 AM [facility]

## 2021-07-04 ENCOUNTER — Other Ambulatory Visit: Payer: Self-pay

## 2021-07-04 ENCOUNTER — Ambulatory Visit: Payer: BC Managed Care – PPO | Admitting: Nurse Practitioner

## 2021-07-04 ENCOUNTER — Encounter: Payer: Self-pay | Admitting: Nurse Practitioner

## 2021-07-04 VITALS — BP 120/62 | HR 70 | Temp 97.7°F | Wt 199.2 lb

## 2021-07-04 DIAGNOSIS — I10 Essential (primary) hypertension: Secondary | ICD-10-CM

## 2021-07-04 DIAGNOSIS — E559 Vitamin D deficiency, unspecified: Secondary | ICD-10-CM | POA: Diagnosis not present

## 2021-07-04 DIAGNOSIS — E291 Testicular hypofunction: Secondary | ICD-10-CM

## 2021-07-04 DIAGNOSIS — F17200 Nicotine dependence, unspecified, uncomplicated: Secondary | ICD-10-CM

## 2021-07-04 DIAGNOSIS — R7309 Other abnormal glucose: Secondary | ICD-10-CM

## 2021-07-04 DIAGNOSIS — E669 Obesity, unspecified: Secondary | ICD-10-CM

## 2021-07-04 DIAGNOSIS — E663 Overweight: Secondary | ICD-10-CM

## 2021-07-04 DIAGNOSIS — Z23 Encounter for immunization: Secondary | ICD-10-CM | POA: Diagnosis not present

## 2021-07-04 DIAGNOSIS — E782 Mixed hyperlipidemia: Secondary | ICD-10-CM | POA: Diagnosis not present

## 2021-07-05 LAB — COMPLETE METABOLIC PANEL WITH GFR
AG Ratio: 2 (calc) (ref 1.0–2.5)
ALT: 21 U/L (ref 9–46)
AST: 18 U/L (ref 10–40)
Albumin: 4.5 g/dL (ref 3.6–5.1)
Alkaline phosphatase (APISO): 70 U/L (ref 36–130)
BUN: 9 mg/dL (ref 7–25)
CO2: 28 mmol/L (ref 20–32)
Calcium: 10.2 mg/dL (ref 8.6–10.3)
Chloride: 106 mmol/L (ref 98–110)
Creat: 0.86 mg/dL (ref 0.60–1.29)
Globulin: 2.3 g/dL (calc) (ref 1.9–3.7)
Glucose, Bld: 82 mg/dL (ref 65–99)
Potassium: 4.7 mmol/L (ref 3.5–5.3)
Sodium: 141 mmol/L (ref 135–146)
Total Bilirubin: 0.8 mg/dL (ref 0.2–1.2)
Total Protein: 6.8 g/dL (ref 6.1–8.1)
eGFR: 107 mL/min/{1.73_m2} (ref >=60)

## 2021-07-05 LAB — LIPID PANEL
Cholesterol: 177 mg/dL (ref <200)
HDL: 38 mg/dL — ABNORMAL LOW (ref >=40)
LDL Cholesterol (Calc): 110 mg/dL (calc) — ABNORMAL HIGH
Non-HDL Cholesterol (Calc): 139 mg/dL (calc) — ABNORMAL HIGH (ref <130)
Total CHOL/HDL Ratio: 4.7 (calc) (ref <5.0)
Triglycerides: 173 mg/dL — ABNORMAL HIGH (ref <150)

## 2021-07-05 LAB — HEMOGLOBIN A1C
Hgb A1c MFr Bld: 5 % of total Hgb (ref <5.7)
Mean Plasma Glucose: 97 mg/dL
eAG (mmol/L): 5.4 mmol/L

## 2021-07-05 LAB — CBC WITH DIFFERENTIAL/PLATELET
Absolute Monocytes: 770 cells/uL (ref 200–950)
Basophils Absolute: 39 cells/uL (ref 0–200)
Basophils Relative: 0.5 %
Eosinophils Absolute: 131 cells/uL (ref 15–500)
Eosinophils Relative: 1.7 %
HCT: 49.8 % (ref 38.5–50.0)
Hemoglobin: 16.9 g/dL (ref 13.2–17.1)
Lymphs Abs: 1848 cells/uL (ref 850–3900)
MCH: 31.4 pg (ref 27.0–33.0)
MCHC: 33.9 g/dL (ref 32.0–36.0)
MCV: 92.6 fL (ref 80.0–100.0)
MPV: 11.5 fL (ref 7.5–12.5)
Monocytes Relative: 10 %
Neutro Abs: 4913 cells/uL (ref 1500–7800)
Neutrophils Relative %: 63.8 %
Platelets: 251 10*3/uL (ref 140–400)
RBC: 5.38 10*6/uL (ref 4.20–5.80)
RDW: 12.4 % (ref 11.0–15.0)
Total Lymphocyte: 24 %
WBC: 7.7 10*3/uL (ref 3.8–10.8)

## 2021-07-05 LAB — TSH: TSH: 1.3 mIU/L (ref 0.40–4.50)

## 2021-12-19 ENCOUNTER — Ambulatory Visit (INDEPENDENT_AMBULATORY_CARE_PROVIDER_SITE_OTHER): Payer: BC Managed Care – PPO | Admitting: Internal Medicine

## 2021-12-19 ENCOUNTER — Encounter: Payer: Self-pay | Admitting: Internal Medicine

## 2021-12-19 VITALS — BP 138/90 | HR 55 | Temp 97.7°F | Ht 69.0 in | Wt 197.8 lb

## 2021-12-19 DIAGNOSIS — R5383 Other fatigue: Secondary | ICD-10-CM

## 2021-12-19 DIAGNOSIS — F17200 Nicotine dependence, unspecified, uncomplicated: Secondary | ICD-10-CM

## 2021-12-19 DIAGNOSIS — Z13220 Encounter for screening for lipoid disorders: Secondary | ICD-10-CM | POA: Diagnosis not present

## 2021-12-19 DIAGNOSIS — Z131 Encounter for screening for diabetes mellitus: Secondary | ICD-10-CM

## 2021-12-19 DIAGNOSIS — R350 Frequency of micturition: Secondary | ICD-10-CM | POA: Diagnosis not present

## 2021-12-19 DIAGNOSIS — Z1389 Encounter for screening for other disorder: Secondary | ICD-10-CM

## 2021-12-19 DIAGNOSIS — N401 Enlarged prostate with lower urinary tract symptoms: Secondary | ICD-10-CM

## 2021-12-19 DIAGNOSIS — Z111 Encounter for screening for respiratory tuberculosis: Secondary | ICD-10-CM | POA: Diagnosis not present

## 2021-12-19 DIAGNOSIS — Z8249 Family history of ischemic heart disease and other diseases of the circulatory system: Secondary | ICD-10-CM

## 2021-12-19 DIAGNOSIS — R0989 Other specified symptoms and signs involving the circulatory and respiratory systems: Secondary | ICD-10-CM

## 2021-12-19 DIAGNOSIS — N5202 Corporo-venous occlusive erectile dysfunction: Secondary | ICD-10-CM

## 2021-12-19 DIAGNOSIS — Z0000 Encounter for general adult medical examination without abnormal findings: Secondary | ICD-10-CM | POA: Diagnosis not present

## 2021-12-19 DIAGNOSIS — E559 Vitamin D deficiency, unspecified: Secondary | ICD-10-CM

## 2021-12-19 DIAGNOSIS — IMO0001 Reserved for inherently not codable concepts without codable children: Secondary | ICD-10-CM

## 2021-12-19 DIAGNOSIS — Z136 Encounter for screening for cardiovascular disorders: Secondary | ICD-10-CM | POA: Diagnosis not present

## 2021-12-19 DIAGNOSIS — Z79899 Other long term (current) drug therapy: Secondary | ICD-10-CM

## 2021-12-19 DIAGNOSIS — Z1329 Encounter for screening for other suspected endocrine disorder: Secondary | ICD-10-CM | POA: Diagnosis not present

## 2021-12-19 DIAGNOSIS — E291 Testicular hypofunction: Secondary | ICD-10-CM

## 2021-12-19 DIAGNOSIS — E349 Endocrine disorder, unspecified: Secondary | ICD-10-CM

## 2021-12-19 DIAGNOSIS — Z130 Encounter for screening for diseases of the blood and blood-forming organs and certain disorders involving the immune mechanism: Secondary | ICD-10-CM | POA: Diagnosis not present

## 2021-12-19 DIAGNOSIS — R7309 Other abnormal glucose: Secondary | ICD-10-CM

## 2021-12-19 DIAGNOSIS — Z1211 Encounter for screening for malignant neoplasm of colon: Secondary | ICD-10-CM

## 2021-12-19 DIAGNOSIS — Z125 Encounter for screening for malignant neoplasm of prostate: Secondary | ICD-10-CM | POA: Diagnosis not present

## 2021-12-19 DIAGNOSIS — I10 Essential (primary) hypertension: Secondary | ICD-10-CM

## 2021-12-19 DIAGNOSIS — E782 Mixed hyperlipidemia: Secondary | ICD-10-CM

## 2021-12-19 MED ORDER — VARENICLINE TARTRATE 1 MG PO TABS: ORAL_TABLET | ORAL | 1 refills | Status: DC

## 2021-12-19 MED ORDER — TESTOSTERONE CYPIONATE 200 MG/ML IM SOLN: INTRAMUSCULAR | 2 refills | Status: DC

## 2021-12-19 MED ORDER — TADALAFIL 20 MG PO TABS: ORAL_TABLET | ORAL | 2 refills | Status: DC

## 2021-12-20 ENCOUNTER — Encounter: Payer: BC Managed Care – PPO | Admitting: Internal Medicine

## 2021-12-20 LAB — COMPLETE METABOLIC PANEL WITH GFR
AG Ratio: 1.7 (calc) (ref 1.0–2.5)
ALT: 24 U/L (ref 9–46)
AST: 22 U/L (ref 10–40)
Albumin: 4.7 g/dL (ref 3.6–5.1)
Alkaline phosphatase (APISO): 74 U/L (ref 36–130)
BUN: 14 mg/dL (ref 7–25)
CO2: 29 mmol/L (ref 20–32)
Calcium: 10.5 mg/dL — ABNORMAL HIGH (ref 8.6–10.3)
Chloride: 104 mmol/L (ref 98–110)
Creat: 1.08 mg/dL (ref 0.60–1.29)
Globulin: 2.7 g/dL (calc) (ref 1.9–3.7)
Glucose, Bld: 88 mg/dL (ref 65–99)
Potassium: 5.4 mmol/L — ABNORMAL HIGH (ref 3.5–5.3)
Sodium: 141 mmol/L (ref 135–146)
Total Bilirubin: 1 mg/dL (ref 0.2–1.2)
Total Protein: 7.4 g/dL (ref 6.1–8.1)
eGFR: 85 mL/min/{1.73_m2} (ref >=60)

## 2021-12-20 LAB — URINALYSIS, ROUTINE W REFLEX MICROSCOPIC
Bilirubin Urine: NEGATIVE
Glucose, UA: NEGATIVE
Hgb urine dipstick: NEGATIVE
Ketones, ur: NEGATIVE
Leukocytes,Ua: NEGATIVE
Nitrite: NEGATIVE
Protein, ur: NEGATIVE
Specific Gravity, Urine: 1.007 (ref 1.001–1.035)
pH: 8 (ref 5.0–8.0)

## 2021-12-20 LAB — CBC WITH DIFFERENTIAL/PLATELET
Absolute Monocytes: 627 cells/uL (ref 200–950)
Basophils Absolute: 40 cells/uL (ref 0–200)
Basophils Relative: 0.6 %
Eosinophils Absolute: 119 cells/uL (ref 15–500)
Eosinophils Relative: 1.8 %
HCT: 53.7 % — ABNORMAL HIGH (ref 38.5–50.0)
Hemoglobin: 18 g/dL — ABNORMAL HIGH (ref 13.2–17.1)
Lymphs Abs: 1498 cells/uL (ref 850–3900)
MCH: 30.8 pg (ref 27.0–33.0)
MCHC: 33.5 g/dL (ref 32.0–36.0)
MCV: 92 fL (ref 80.0–100.0)
MPV: 11.6 fL (ref 7.5–12.5)
Monocytes Relative: 9.5 %
Neutro Abs: 4316 cells/uL (ref 1500–7800)
Neutrophils Relative %: 65.4 %
Platelets: 226 10*3/uL (ref 140–400)
RBC: 5.84 10*6/uL — ABNORMAL HIGH (ref 4.20–5.80)
RDW: 13.1 % (ref 11.0–15.0)
Total Lymphocyte: 22.7 %
WBC: 6.6 10*3/uL (ref 3.8–10.8)

## 2021-12-20 LAB — MICROALBUMIN / CREATININE URINE RATIO
Creatinine, Urine: 41 mg/dL (ref 20–320)
Microalb, Ur: <0.2 mg/dL

## 2021-12-20 LAB — TSH: TSH: 1.45 mIU/L (ref 0.40–4.50)

## 2021-12-20 LAB — HEMOGLOBIN A1C
Hgb A1c MFr Bld: 5 % of total Hgb (ref <5.7)
Mean Plasma Glucose: 97 mg/dL
eAG (mmol/L): 5.4 mmol/L

## 2021-12-20 LAB — IRON, TOTAL/TOTAL IRON BINDING CAP
%SAT: 32 % (calc) (ref 20–48)
Iron: 99 ug/dL (ref 50–180)
TIBC: 312 mcg/dL (calc) (ref 250–425)

## 2021-12-20 LAB — LIPID PANEL
Cholesterol: 164 mg/dL (ref <200)
HDL: 45 mg/dL (ref >=40)
LDL Cholesterol (Calc): 92 mg/dL (calc)
Non-HDL Cholesterol (Calc): 119 mg/dL (calc) (ref <130)
Total CHOL/HDL Ratio: 3.6 (calc) (ref <5.0)
Triglycerides: 169 mg/dL — ABNORMAL HIGH (ref <150)

## 2021-12-20 LAB — INSULIN, RANDOM: Insulin: 18.5 u[IU]/mL — ABNORMAL HIGH

## 2021-12-20 LAB — VITAMIN D 25 HYDROXY (VIT D DEFICIENCY, FRACTURES): Vit D, 25-Hydroxy: 77 ng/mL (ref 30–100)

## 2021-12-20 LAB — PSA: PSA: 1.25 ng/mL (ref <=4.00)

## 2021-12-20 LAB — MAGNESIUM: Magnesium: 2.3 mg/dL (ref 1.5–2.5)

## 2021-12-20 LAB — VITAMIN B12: Vitamin B-12: 441 pg/mL (ref 200–1100)

## 2021-12-20 LAB — TESTOSTERONE: Testosterone: 695 ng/dL (ref 250–827)

## 2021-12-21 NOTE — Progress Notes (Signed)
<><><><><><><><><><><><><><><><><><><><><><><><><><><><><><><><><> <><><><><><><><><><><><><><><><><><><><><><><><><><><><><><><><><> -   Test results slightly outside the reference range are not unusual. If there is anything important, I will review this with you,  otherwise it is considered normal test values.  If you have further questions,  please do not hesitate to contact me at the office or via My Chart.  <><><><><><><><><><><><><><><><><><><><><><><><><><><><><><><><><> <><><><><><><><><><><><><><><><><><><><><><><><><><><><><><><><><>  -  Total Chol = 164  & LDL Chol = 92  - Excellent   - Very low risk for Heart Attack  / Stroke <><><><><><><><><><><><><><><><><><><><><><><><><><><><><><><><><>  -  Iron & Vit B12 levels - Both Normal & OK  <><><><><><><><><><><><><><><><><><><><><><><><><><><><><><><><><>  - PSA - Low - Great  <><><><><><><><><><><><><><><><><><><><><><><><><><><><><><><><><>  - Testosterone level - Normal - Great ! <><><><><><><><><><><><><><><><><><><><><><><><><><><><><><><><><>  - A1c - Normal  - No Diabetes  - Great ! <><><><><><><><><><><><><><><><><><><><><><><><><><><><><><><><><>  - Vitamin D = 77  - Excellent  - Please keep dose same  ! <><><><><><><><><><><><><><><><><><><><><><><><><><><><><><><><><>  - All Else - CBC - Kidneys - Electrolytes - Liver - Magnesium & Thyroid    - all  Normal / OK <><><><><><><><><><><><><><><><><><><><><><><><><><><><><><><><><> <><><><><><><><><><><><><><><><><><><><><><><><><><><><><><><><><>

## 2021-12-22 ENCOUNTER — Encounter: Payer: Self-pay | Admitting: Internal Medicine

## 2021-12-23 LAB — TB SKIN TEST
Induration: 0 mm
TB Skin Test: NEGATIVE

## 2022-04-03 ENCOUNTER — Ambulatory Visit: Payer: BC Managed Care – PPO | Admitting: Nurse Practitioner

## 2022-04-03 NOTE — Progress Notes (Deleted)
FOLLOW UP  Assessment and Plan:   Hypertension Discussed DASH (Dietary Approaches to Stop Hypertension) DASH diet is lower in sodium than a typical American diet. Cut back on foods that are high in saturated fat, cholesterol, and trans fats. Eat more whole-grain foods, fish, poultry, and nuts Remain active and exercise as tolerated daily.  Monitor BP at home-Call if greater than 130/80.  Check CMP/CBC   Cholesterol Discussed lifestyle modifications. Recommended diet heavy in fruits and veggies, omega 3's. Decrease consumption of animal meats, cheeses, and dairy products. Remain active and exercise as tolerated. Continue to monitor. Check lipids/TSH   Other abnormal glucose Education: Reviewed 'ABCs' of diabetes management  Discussed goals to be met and/or maintained include A1C (<7) Blood pressure (<130/80) Cholesterol (LDL <70) Continue Eye Exam yearly  Continue Dental Exam Q6 mo Discussed dietary recommendations Discussed Physical Activity recommendations Foot exam UTD Check A1C    Overweight Discussed appropriate BMI Goal of losing 1 lb per month. Diet modification. Physical activity. Encouraged/praised to build confidence.   Vitamin D Def Continue supplement Monitor levels  Testosterone deficiency Continue to monitor, states medication is helping with symptoms of low T.  Suggested addition of zinc 50 mg daily, work on weight loss  Tobacco use Smoking cessation instruction/counseling given:  counseled patient on the dangers of tobacco use, advised patient to stop smoking, and reviewed strategies to maximize success  Patient is not ready to do so, but advised to consider strongly Will follow up at the next visit    Continue diet and meds as discussed. Further disposition pending results of labs. Discussed med's effects and SE's.    Over 15 minutes of exam, counseling, chart review, and critical decision making was performed.   Future Appointments   Date Time Provider [place]  04/03/2022  9:30 AM Darrol Jump, NP [facility] None  07/10/2022  9:30 AM Unk Pinto, MD [facility] None  12/20/2022 10:00 AM Unk Pinto, MD [facility] None    ----------------------------------------------------------------------------------------------------------------------  HPI 46 y.o. male  presents for 3 month follow up on hypertension, cholesterol, glucose management (hx of prediabetes), obesity, testosterone deficiency and vitamin D deficiency.   Difficulty falling asleep, takes melatonin which works well intermittently. Ongoing since childhood. Admits watching TV in bedroom prior to bedtime - discussed sleep hygiene.   he currently continues to smoke 1- 11/2 pack a day; since 1989, 32 pack year history, discussed risks associated with smoking, patient is not ready to quit. Will consider in the future, father and sister had lung cancer 02/2021 and 04/2021  BMI is There is no height or weight on file to calculate BMI., he has new job, more physically active, delivering uniforms, lifting and walking. Admits eating bad food, fast food, snacking, but cutting down on portions and stopping eating later.  Wt Readings from Last 3 Encounters:  12/19/21 197 lb 12.8 oz (89.7 kg)  07/04/21 199 lb 3.2 oz (90.4 kg)  01/27/21 191 lb 3.2 oz (86.7 kg)   He BP is controlled at home, 120s/80s, today their BP is   BP Readings from Last 3 Encounters:  12/19/21 138/90  07/04/21 120/62  01/27/21 132/78     He does not workout. He denies chest pain, shortness of breath, dizziness.   He is on cholesterol medication (atorvastatin 80 mg daily) and denies myalgias. His cholesterol is at goal. The cholesterol last visit was:   Lab Results  Component Value Date   CHOL 164 12/19/2021   HDL 45 12/19/2021   LDLCALC 92  12/19/2021   TRIG 169 (H) 12/19/2021   CHOLHDL 3.6 12/19/2021    He has been working on diet and exercise for glucose management (hx of  prediabetes), and denies increased appetite, nausea, paresthesia of the feet, polydipsia, polyuria, visual disturbances and vomiting. Last A1C in the office was:  Lab Results  Component Value Date   HGBA1C 5.0 12/19/2021   Patient is on Vitamin D supplement and below goal at recent check, was off of supplement, back taking 10000 IU which had him at goal previous:    Lab Results  Component Value Date   VD25OH 77 12/19/2021     He has a history of testosterone deficiency and is on testosterone replacement, taking 200 mg IM every 2 weeks. He states that the testosterone helps with his energy, libido, muscle mass. Lab Results  Component Value Date   TESTOSTERONE 695 12/19/2021      Current Medications:  Current Outpatient Medications on File Prior to Visit  Medication Sig   acetaminophen (TYLENOL) 325 MG tablet Take 650 mg by mouth every 6 (six) hours as needed.   aspirin EC 81 MG tablet Take 81 mg by mouth daily.   atorvastatin (LIPITOR) 80 MG tablet Take  1 tablet  Daily  for Cholesterol   cholecalciferol (VITAMIN D) 1000 units tablet Take 10,000 Units by mouth daily.   tadalafil (CIALIS) 20 MG tablet Take 1/2 to 1 tablet every 2 to 3 days as needed for XXXX   testosterone cypionate (DEPOTESTOSTERONE CYPIONATE) 200 MG/ML injection Inject  1 ml  into Muscle  each Week (7 days)   varenicline (CHANTIX CONTINUING MONTH PAK) 1 MG tablet Take  1/2 to 1 tablet  2 x/day (every 12 hours) for Smoking Cessation   No current facility-administered medications on file prior to visit.     Allergies: No Known Allergies   Medical History:  Past Medical History:  Diagnosis Date   Hyperlipidemia    Hypertension    Hypogonadism male    Obesity    Prediabetes    Vitamin D deficiency    Family history- Reviewed and unchanged Social history- Reviewed and unchanged   Review of Systems:  Review of Systems  Constitutional:  Negative for chills, diaphoresis, fever, malaise/fatigue and weight  loss.  HENT:  Negative for congestion, ear discharge, ear pain, hearing loss, sinus pain, sore throat and tinnitus.   Eyes:  Negative for blurred vision and double vision.  Respiratory:  Negative for cough, sputum production, shortness of breath and wheezing.   Cardiovascular:  Negative for chest pain, palpitations, orthopnea, claudication and leg swelling.  Gastrointestinal:  Negative for abdominal pain, blood in stool, constipation, diarrhea, heartburn, melena, nausea and vomiting.  Genitourinary: Negative.   Musculoskeletal:  Negative for joint pain and myalgias.  Skin:  Negative for rash.  Neurological:  Negative for dizziness, tingling, sensory change, weakness and headaches.  Endo/Heme/Allergies:  Negative for environmental allergies and polydipsia.  Psychiatric/Behavioral:  Negative for depression, substance abuse and suicidal ideas. The patient is not nervous/anxious and does not have insomnia.   All other systems reviewed and are negative.     Physical Exam: There were no vitals taken for this visit. Wt Readings from Last 3 Encounters:  12/19/21 197 lb 12.8 oz (89.7 kg)  07/04/21 199 lb 3.2 oz (90.4 kg)  01/27/21 191 lb 3.2 oz (86.7 kg)   General Appearance: Well nourished, in no apparent distress. Eyes: PERRLA, EOMs, conjunctiva no swelling or erythema Sinuses: No Frontal/maxillary tenderness ENT/Mouth: Ext  aud canals clear, TMs without erythema, bulging. No erythema, swelling, or exudate on post pharynx.  Tonsils not swollen or erythematous. Hearing normal.  Neck: Supple, thyroid normal.  Respiratory: Respiratory effort normal, BS equal bilaterally without rales, rhonchi, wheezing or stridor.  Cardio: RRR with no MRGs. Brisk peripheral pulses without edema.  Abdomen: Soft, + BS.  Non tender, no guarding, rebound, hernias, masses. Lymphatics: Non tender without lymphadenopathy.  Musculoskeletal: Full ROM, 5/5 strength, Normal gait Skin: Warm, dry without rashes, lesions,  ecchymosis.  Neuro: Cranial nerves intact. No cerebellar symptoms.  Psych: Awake and oriented X 3, normal affect, Insight and Judgment appropriate.    Darrol Jump, NP 8:40 AM [facility]

## 2022-04-17 ENCOUNTER — Ambulatory Visit: Payer: BC Managed Care – PPO | Admitting: Nurse Practitioner

## 2022-06-23 ENCOUNTER — Other Ambulatory Visit: Payer: Self-pay | Admitting: Internal Medicine

## 2022-06-23 DIAGNOSIS — F17200 Nicotine dependence, unspecified, uncomplicated: Secondary | ICD-10-CM

## 2022-07-09 NOTE — Progress Notes (Unsigned)
Future Appointments  Date Time Provider Department  07/10/2022  9:30 AM Unk Pinto, MD [facility]  12/20/2022 10:00 AM Unk Pinto, MD [facility]    History of Present Illness:       This very nice 47 y.o. MWM presents for 3 month follow up with labile HTN, HLD, Prediabetes and Vitamin D Deficiency .        Patient is monitored expectantly for labile HTN  (2007)  & BP has been controlled at home. Today's  . Patient has had no complaints of any cardiac type chest pain, palpitations, dyspnea / orthopnea / PND, dizziness, claudication, or dependent edema.       Hyperlipidemia is controlled with diet & meds. Patient denies myalgias or other med SE's. Last Lipids were at goal :  Lab Results  Component Value Date   CHOL 164 12/19/2021   HDL 45 12/19/2021   LDLCALC 92 12/19/2021   TRIG 169 (H) 12/19/2021   CHOLHDL 3.6 12/19/2021     Also, the patient has history of PreDiabetes (A1c 5.7% /2015 and 5.8% /2016) ) and has had no symptoms of reactive hypoglycemia, diabetic polys, paresthesias or visual blurring.  Last A1c was normal & at goal :  Lab Results  Component Value Date   HGBA1C 5.0 12/19/2021                                                           Patient has hx/o Testosterone Deficiency ("218" /2012 and "201"/2014) and is on Depo-Testosterone injections with improved stamina & sense of well-being. Last testosterone level was 695 - in normal range .                                                      Further, the patient also has history of Vitamin D Deficiency  ("13" /2008) and supplements vitamin D . Last vitamin D was at goal :  Lab Results  Component Value Date   VD25OH 77 12/19/2021     Current Outpatient Medications on File Prior to Visit  Medication Sig   acetaminophen 325 MG tablet Take 650 mg  every 6  hours as needed.   aspirin EC 81 MG tablet Take daily.   atorvastatin ( 80 MG tablet Take  1 tablet  Daily  for Cholesterol   VITAMIN D   10,000 Unit Take s  daily.   tadalafil  20 MG tablet Take 1/2-1 tab  every 2-3 days as needed    testosterone cypio  200 MG/ML inject Inject  1 ml  into Muscle  each Week (7 days)   varenicline (CHANTIX) 1 MG tablet Take 1/2 - 1 tab 2 x/day            No Known Allergies   PMHx:   Past Medical History:  Diagnosis Date   Hyperlipidemia    Hypertension    Hypogonadism male    Obesity    Prediabetes    Vitamin D deficiency      Immunization History  Administered Date(s) Administered   Influenza   03/31/2014, 04/14/2015   Influenza,inj,Quad   07/04/2021   PFIZER SARS-COV-2  Vacc  09/28/2019, 10/29/2019   PPD Test 08/06/2019, 12/20/2020, 12/19/2021   Pneumococcal - 23 12/05/2011   Tdap 12/05/2011     Past Surgical History:  Procedure Laterality Date   CERVICAL FUSION      FHx:    Reviewed / unchanged  SHx:    Reviewed / unchanged   Systems Review:  Constitutional: Denies fever, chills, wt changes, headaches, insomnia, fatigue, night sweats, change in appetite. Eyes: Denies redness, blurred vision, diplopia, discharge, itchy, watery eyes.  ENT: Denies discharge, congestion, post nasal drip, epistaxis, sore throat, earache, hearing loss, dental pain, tinnitus, vertigo, sinus pain, snoring.  CV: Denies chest pain, palpitations, irregular heartbeat, syncope, dyspnea, diaphoresis, orthopnea, PND, claudication or edema. Respiratory: denies cough, dyspnea, DOE, pleurisy, hoarseness, laryngitis, wheezing.  Gastrointestinal: Denies dysphagia, odynophagia, heartburn, reflux, water brash, abdominal pain or cramps, nausea, vomiting, bloating, diarrhea, constipation, hematemesis, melena, hematochezia  or hemorrhoids. Genitourinary: Denies dysuria, frequency, urgency, nocturia, hesitancy, discharge, hematuria or flank pain. Musculoskeletal: Denies arthralgias, myalgias, stiffness, jt. swelling, pain, limping or strain/sprain.  Skin: Denies pruritus, rash, hives, warts, acne, eczema or  change in skin lesion(s). Neuro: No weakness, tremor, incoordination, spasms, paresthesia or pain. Psychiatric: Denies confusion, memory loss or sensory loss. Endo: Denies change in weight, skin or hair change.  Heme/Lymph: No excessive bleeding, bruising or enlarged lymph nodes.  Physical Exam  There were no vitals taken for this visit.  Appears  well nourished, well groomed  and in no distress.  Eyes: PERRLA, EOMs, conjunctiva no swelling or erythema. Sinuses: No frontal/maxillary tenderness ENT/Mouth: EAC's clear, TM's nl w/o erythema, bulging. Nares clear w/o erythema, swelling, exudates. Oropharynx clear without erythema or exudates. Oral hygiene is good. Tongue normal, non obstructing. Hearing intact.  Neck: Supple. Thyroid not palpable. Car 2+/2+ without bruits, nodes or JVD. Chest: Respirations nl with BS clear & equal w/o rales, rhonchi, wheezing or stridor.  Cor: Heart sounds normal w/ regular rate and rhythm without sig. murmurs, gallops, clicks or rubs. Peripheral pulses normal and equal  without edema.  Abdomen: Soft & bowel sounds normal. Non-tender w/o guarding, rebound, hernias, masses or organomegaly.  Lymphatics: Unremarkable.  Musculoskeletal: Full ROM all peripheral extremities, joint stability, 5/5 strength and normal gait.  Skin: Warm, dry without exposed rashes, lesions or ecchymosis apparent.  Neuro: Cranial nerves intact, reflexes equal bilaterally. Sensory-motor testing grossly intact. Tendon reflexes grossly intact.  Pysch: Alert & oriented x 3.  Insight and judgement nl & appropriate. No ideations.  Assessment and Plan:  1. Labile hypertension  - Continue medication, monitor blood pressure at home.  - Continue DASH diet.  Reminder to go to the ER if any CP,  SOB, nausea, dizziness, severe HA, changes vision/speech.   - CBC with Differential/Platelet - COMPLETE METABOLIC PANEL WITH GFR - Magnesium - TSH   2. Hyperlipidemia, mixed  - Continue  diet/meds, exercise,& lifestyle modifications.  - Continue monitor periodic cholesterol/liver & renal functions    - Lipid panel - TSH   3. Abnormal glucose  - Continue diet, exercise  - Lifestyle modifications.  - Monitor appropriate labs    - Hemoglobin A1c - Insulin, random   4. Vitamin D deficiency  - Continue supplementation   - VITAMIN D 25 Hydroxy    5. Testosterone Deficiency  - Testosterone  6. Medication management  - CBC with Differential/Platelet - COMPLETE METABOLIC PANEL WITH GFR - Magnesium - Lipid panel - TSH - Hemoglobin A1c - Insulin, random - VITAMIN D 25 Hydroxy  - Testosterone  Discussed  regular exercise, BP monitoring, weight control to achieve/maintain BMI less than 25 and discussed med and SE's. Recommended labs to assess /monitor clinical status .  I discussed the assessment and treatment plan with the patient. The patient was provided an opportunity to ask questions and all were answered. The patient agreed with the plan and demonstrated an understanding of the instructions.  I provided over 30 minutes of exam, counseling, chart review and  complex critical decision making.        The patient was advised to call back or seek an in-person evaluation if the symptoms worsen or if the condition fails to improve as anticipated.   Kirtland Bouchard, MD

## 2022-07-10 ENCOUNTER — Encounter: Payer: Self-pay | Admitting: Internal Medicine

## 2022-07-10 ENCOUNTER — Ambulatory Visit: Payer: BC Managed Care – PPO | Admitting: Internal Medicine

## 2022-07-10 VITALS — BP 138/80 | HR 65 | Temp 98.8°F | Resp 16 | Ht 69.0 in | Wt 217.0 lb

## 2022-07-10 DIAGNOSIS — E291 Testicular hypofunction: Secondary | ICD-10-CM | POA: Diagnosis not present

## 2022-07-10 DIAGNOSIS — R7309 Other abnormal glucose: Secondary | ICD-10-CM

## 2022-07-10 DIAGNOSIS — E782 Mixed hyperlipidemia: Secondary | ICD-10-CM

## 2022-07-10 DIAGNOSIS — E559 Vitamin D deficiency, unspecified: Secondary | ICD-10-CM | POA: Diagnosis not present

## 2022-07-10 DIAGNOSIS — F17211 Nicotine dependence, cigarettes, in remission: Secondary | ICD-10-CM

## 2022-07-10 DIAGNOSIS — K0401 Reversible pulpitis: Secondary | ICD-10-CM

## 2022-07-10 DIAGNOSIS — Z79899 Other long term (current) drug therapy: Secondary | ICD-10-CM

## 2022-07-10 DIAGNOSIS — R0989 Other specified symptoms and signs involving the circulatory and respiratory systems: Secondary | ICD-10-CM

## 2022-07-10 MED ORDER — BUPROPION HCL ER (XL) 300 MG PO TB24: ORAL_TABLET | ORAL | 3 refills | Status: DC

## 2022-07-10 MED ORDER — CEPHALEXIN 500 MG PO CAPS: ORAL_CAPSULE | ORAL | 0 refills | Status: DC

## 2022-07-10 NOTE — Patient Instructions (Addendum)

## 2022-07-11 LAB — CBC WITH DIFFERENTIAL/PLATELET
Absolute Monocytes: 792 cells/uL (ref 200–950)
Basophils Absolute: 50 cells/uL (ref 0–200)
Basophils Relative: 0.7 %
Eosinophils Absolute: 151 cells/uL (ref 15–500)
Eosinophils Relative: 2.1 %
HCT: 52.7 % — ABNORMAL HIGH (ref 38.5–50.0)
Hemoglobin: 17.7 g/dL — ABNORMAL HIGH (ref 13.2–17.1)
Lymphs Abs: 1800 cells/uL (ref 850–3900)
MCH: 30.8 pg (ref 27.0–33.0)
MCHC: 33.6 g/dL (ref 32.0–36.0)
MCV: 91.7 fL (ref 80.0–100.0)
MPV: 11.8 fL (ref 7.5–12.5)
Monocytes Relative: 11 %
Neutro Abs: 4406 cells/uL (ref 1500–7800)
Neutrophils Relative %: 61.2 %
Platelets: 205 10*3/uL (ref 140–400)
RBC: 5.75 10*6/uL (ref 4.20–5.80)
RDW: 12.4 % (ref 11.0–15.0)
Total Lymphocyte: 25 %
WBC: 7.2 10*3/uL (ref 3.8–10.8)

## 2022-07-11 LAB — LIPID PANEL
Cholesterol: 144 mg/dL (ref <200)
HDL: 39 mg/dL — ABNORMAL LOW (ref >=40)
LDL Cholesterol (Calc): 76 mg/dL (calc)
Non-HDL Cholesterol (Calc): 105 mg/dL (calc) (ref <130)
Total CHOL/HDL Ratio: 3.7 (calc) (ref <5.0)
Triglycerides: 203 mg/dL — ABNORMAL HIGH (ref <150)

## 2022-07-11 LAB — COMPLETE METABOLIC PANEL WITH GFR
AG Ratio: 1.9 (calc) (ref 1.0–2.5)
ALT: 21 U/L (ref 9–46)
AST: 21 U/L (ref 10–40)
Albumin: 4.4 g/dL (ref 3.6–5.1)
Alkaline phosphatase (APISO): 72 U/L (ref 36–130)
BUN: 8 mg/dL (ref 7–25)
CO2: 27 mmol/L (ref 20–32)
Calcium: 9.9 mg/dL (ref 8.6–10.3)
Chloride: 106 mmol/L (ref 98–110)
Creat: 0.99 mg/dL (ref 0.60–1.29)
Globulin: 2.3 g/dL (calc) (ref 1.9–3.7)
Glucose, Bld: 75 mg/dL (ref 65–99)
Potassium: 4.6 mmol/L (ref 3.5–5.3)
Sodium: 140 mmol/L (ref 135–146)
Total Bilirubin: 0.6 mg/dL (ref 0.2–1.2)
Total Protein: 6.7 g/dL (ref 6.1–8.1)
eGFR: 94 mL/min/{1.73_m2} (ref >=60)

## 2022-07-11 LAB — INSULIN, RANDOM: Insulin: 8.9 u[IU]/mL

## 2022-07-11 LAB — MAGNESIUM: Magnesium: 2.2 mg/dL (ref 1.5–2.5)

## 2022-07-11 LAB — TESTOSTERONE: Testosterone: 659 ng/dL (ref 250–827)

## 2022-07-11 LAB — VITAMIN D 25 HYDROXY (VIT D DEFICIENCY, FRACTURES): Vit D, 25-Hydroxy: 72 ng/mL (ref 30–100)

## 2022-07-11 LAB — HEMOGLOBIN A1C
Hgb A1c MFr Bld: 5.2 % of total Hgb (ref <5.7)
Mean Plasma Glucose: 103 mg/dL
eAG (mmol/L): 5.7 mmol/L

## 2022-07-11 LAB — TSH: TSH: 2.22 mIU/L (ref 0.40–4.50)

## 2022-07-11 NOTE — Progress Notes (Signed)
<><><><><><><><><><><><><><><><><><><><><><><><><><><><><><><><><> <><><><><><><><><><><><><><><><><><><><><><><><><><><><><><><><><> -   Test results slightly outside the reference range are not unusual. If there is anything important, I will review this with you,  otherwise it is considered normal test values.  If you have further questions,  please do not hesitate to contact me at the office or via My Chart.  <><><><><><><><><><><><><><><><><><><><><><><><><><><><><><><><><> <><><><><><><><><><><><><><><><><><><><><><><><><><><><><><><><><>  -  Total Chol  = 144         &              LDL Chol = 76               - Both      Excellent   - Very low risk for Heart Attack  / Stroke <><><><><><><><><><><><><><><><><><><><><><><><><><><><><><><><><>  -  But Triglycerides (Blood Fats) 203 slightly elevated  (goal is less than 180)   - Recommend avoid fried & greasy foods,  sweets / candy,   - Avoid white rice  (brown or wild rice or Quinoa is OK),   - Avoid white potatoes  (sweet potatoes are OK)   - Avoid anything made from white flour  - bagels, doughnuts, rolls, buns, biscuits, white and   wheat breads, pizza crust and traditional  pasta made of white flour & egg white  - (vegetarian pasta or spinach or wheat pasta is OK).    - Multi-grain bread is OK - like multi-grain flat bread or  sandwich thins.   - Avoid alcohol in excess.   - Exercise is also important. <><><><><><><><><><><><><><><><><><><><><><><><><><><><><><><><><> <><><><><><><><><><><><><><><><><><><><><><><><><><><><><><><><><>  -  Testosterone is Normal     <><><><><><><><><><><><><><><><><><><><><><><><><><><><><><><><><>  -  A1c  - Normal - No Diabetes - Great ! <><><><><><><><><><><><><><><><><><><><><><><><><><><><><><><><><>  -  Vitamin D = 72 - Excellent - Please keep dose same  <><><><><><><><><><><><><><><><><><><><><><><><><><><><><><><><><>  -  All Else - CBC - Kidneys - Electrolytes - Liver -  Magnesium & Thyroid    - all  Normal / OK <><><><><><><><><><><><><><><><><><><><><><><><><><><><><><><><><> <><><><><><><><><><><><><><><><><><><><><><><><><><><><><><><><><>  -  Keep up the Great Work !  <><><><><><><><><><><><><><><><><><><><><><><><><><><><><><><><><> <><><><><><><><><><><><><><><><><><><><><><><><><><><><><><><><><>

## 2022-10-02 ENCOUNTER — Other Ambulatory Visit: Payer: Self-pay | Admitting: Nurse Practitioner

## 2022-10-02 ENCOUNTER — Encounter: Payer: Self-pay | Admitting: Internal Medicine

## 2022-10-02 DIAGNOSIS — E782 Mixed hyperlipidemia: Secondary | ICD-10-CM

## 2022-10-02 MED ORDER — ATORVASTATIN CALCIUM 80 MG PO TABS: ORAL_TABLET | ORAL | 3 refills | Status: AC

## 2022-11-07 ENCOUNTER — Encounter: Payer: Self-pay | Admitting: Internal Medicine

## 2022-11-07 ENCOUNTER — Other Ambulatory Visit: Payer: Self-pay | Admitting: Internal Medicine

## 2022-11-07 DIAGNOSIS — K0401 Reversible pulpitis: Secondary | ICD-10-CM

## 2022-11-07 MED ORDER — CEPHALEXIN 500 MG PO CAPS
ORAL_CAPSULE | ORAL | 2 refills | Status: DC
Start: 2022-11-07 — End: 2023-08-14

## 2022-12-20 ENCOUNTER — Encounter: Payer: BC Managed Care – PPO | Admitting: Internal Medicine

## 2023-02-11 ENCOUNTER — Encounter: Payer: Self-pay | Admitting: Internal Medicine

## 2023-02-11 ENCOUNTER — Other Ambulatory Visit: Payer: Self-pay | Admitting: Internal Medicine

## 2023-02-11 DIAGNOSIS — N5202 Corporo-venous occlusive erectile dysfunction: Secondary | ICD-10-CM

## 2023-02-11 MED ORDER — TADALAFIL 20 MG PO TABS
ORAL_TABLET | ORAL | 3 refills | Status: AC
Start: 2023-02-11 — End: ?

## 2023-04-01 ENCOUNTER — Encounter: Payer: Self-pay | Admitting: Internal Medicine

## 2023-04-01 NOTE — Progress Notes (Unsigned)
Annual  Screening/Preventative Visit  & Comprehensive Evaluation & Examination   Future Appointments  Date Time Provider Department  04/02/2023                         cpe  3:00 PM Lucky Cowboy, MD [facility]  04/14/2024                       cpe  3:00 PM Lucky Cowboy, MD [facility]              This very nice 47 y.o. MWM  with  labile HTN, HLD, Prediabetes and Vitamin D Deficienc   presents for a Screening /Preventative Visit & comprehensive evaluation and management of multiple medical co-morbidities.          Labile HTN predates since 2007 now followed by active surveillance.  Patient's BP has been controlled at home.  Today's BP is at goal - 138/90 .   Patient denies any cardiac symptoms as chest pain, palpitations, shortness of breath, dizziness or ankle swelling.        Patient's hyperlipidemia is controlled with diet and Atorvastatin. Patient denies myalgias or other medication SE's. Last lipids were at goal except elevated Trig's ::  Lab Results  Component Value Date   CHOL 144 07/10/2022   HDL 39 (L) 07/10/2022   LDLCALC 76 07/10/2022   TRIG 203 (H) 07/10/2022   CHOLHDL 3.7 07/10/2022                                                         Patient has hx/o Testosterone Deficiency ("218" /2012 and "201"/2014) and is on Depo-Testosterone injections with improved stamina & sense of well-being. Last testosterone level was 659  - in normal range .        Patient has hx/o prediabetes (A1c 5.7% /2015) and patient denies reactive hypoglycemic symptoms, visual blurring, diabetic polys or paresthesias. Last A1c was normal  at goal:   Lab Results  Component Value Date   HGBA1C 5.2 07/10/2022        Finally, patient has history of Vitamin D Deficiency ("13" /2008) and last vitamin D was at goal:   Lab Results  Component Value Date   VD25OH 72 07/10/2022       Current Outpatient Medications  Medication Instructions   Acetaminophen  650 mg Every 6 hours  PRN   aspirin EC  81 mg Daily   atorvastatin 80 MG tablet Take  1 tablet  Daily     buPROPion XL 300 MG  Take  1 tablet  Daily     cephALEXin  500 MG capsule Take  1 capsule  3 x /day  with Meals    VITAMIN D   10,000 Units Daily   tadalafil  20 MG tablet Take 1/2 to 1 tablet every 2 to 3 days as needed    testosterone cypio  200 MG/ML injec Inject  1 ml  into Muscle  each Week (7 days)   varenicline 1 MG tablet TAKE 1/2 TO 1 TABLET 2 X/DAY      No Known Allergies   Past Medical History:  Diagnosis Date   Hyperlipidemia    Hypertension    Hypogonadism male    Obesity  Prediabetes    Vitamin D deficiency    Health Maintenance  Topic Date Due   Pneumococcal Vaccine 63-42 Years old (1 - PCV) Never done   COVID-19 Vaccine (3 - Pfizer risk series) 11/26/2019   INFLUENZA VACCINE  01/24/2021   TETANUS/TDAP  12/04/2021   Hepatitis C Screening  Completed   HIV Screening  Completed   HPV VACCINES  Aged Out     Immunization History  Administered Date(s) Administered   Influenza Split 03/31/2014, 04/14/2015   PFIZER SARS-COV-2 Vacc 09/28/2019, 10/29/2019   PPD Test 01/25/2016, 04/16/2017, 08/06/2019   Pneumococcal-23 12/05/2011   Tdap 12/05/2011    Past Surgical History:  Procedure Laterality Date   CERVICAL FUSION      Family History  Problem Relation Age of Onset   Hyperlipidemia Mother    Hyperlipidemia Father    Cancer Paternal Grandfather        Lung    Social History   Socioeconomic History   Marital status: Married    Spouse name: Aram Beecham   Number of children: 1 son  &   1 daughter  Occupational History   Works Radio broadcast assistant - delivery   Tobacco Use   Smoking status: Every Day    Packs/day: 1.00    Years: 32.00    Pack years: 32.00    Types: Cigarettes    Start date: 1989   Smokeless tobacco: Never  Substance and Sexual Activity   Alcohol use: Yes    Alcohol/week: 4.0 standard drinks    Types: 4 Standard drinks or equivalent per week     Comment: moderate   Drug use: Yes    Frequency: 2.0 times per week    Types: Marijuana   Sexual activity: Not on file      ROS Constitutional: Denies fever, chills, weight loss/gain, headaches, insomnia,  night sweats or change in appetite. Does c/o fatigue. Eyes: Denies redness, blurred vision, diplopia, discharge, itchy or watery eyes.  ENT: Denies discharge, congestion, post nasal drip, epistaxis, sore throat, earache, hearing loss, dental pain, Tinnitus, Vertigo, Sinus pain or snoring.  Cardio: Denies chest pain, palpitations, irregular heartbeat, syncope, dyspnea, diaphoresis, orthopnea, PND, claudication or edema Respiratory: denies cough, dyspnea, DOE, pleurisy, hoarseness, laryngitis or wheezing.  Gastrointestinal: Denies dysphagia, heartburn, reflux, water brash, pain, cramps, nausea, vomiting, bloating, diarrhea, constipation, hematemesis, melena, hematochezia, jaundice or hemorrhoids Genitourinary: Denies dysuria, frequency, urgency, nocturia, hesitancy, discharge, hematuria or flank pain Musculoskeletal: Denies arthralgia, myalgia, stiffness, Jt. Swelling, pain, limp or strain/sprain. Denies Falls. Skin: Denies puritis, rash, hives, warts, acne, eczema or change in skin lesion Neuro: No weakness, tremor, incoordination, spasms, paresthesia or pain Psychiatric: Denies confusion, memory loss or sensory loss. Denies Depression. Endocrine: Denies change in weight, skin, hair change, nocturia, and paresthesia, diabetic polys, visual blurring or hyper / hypo glycemic episodes.  Heme/Lymph: No excessive bleeding, bruising or enlarged lymph nodes.   Physical Exam  There were no vitals taken for this visit.  General Appearance: Over nourished and well groomed and in no apparent distress.  Eyes: PERRLA, EOMs, conjunctiva no swelling or erythema, normal fundi and vessels. Sinuses: No frontal/maxillary tenderness ENT/Mouth: EACs patent / TMs  nl. Nares clear without erythema,  swelling, mucoid exudates. Oral hygiene is good. No erythema, swelling, or exudate. Tongue normal, non-obstructing. Tonsils not swollen or erythematous. Hearing normal.  Neck: Supple, thyroid not palpable. No bruits, nodes or JVD. Respiratory: Respiratory effort normal.  BS equal and clear bilateral without rales, rhonci, wheezing or stridor. Cardio: Heart sounds  are normal with regular rate and rhythm and no murmurs, rubs or gallops. Peripheral pulses are normal and equal bilaterally without edema. No aortic or femoral bruits. Chest: symmetric with normal excursions and percussion.  Abdomen: Soft, with Nl bowel sounds. Nontender, no guarding, rebound, hernias, masses, or organomegaly.  Lymphatics: Non tender without lymphadenopathy.  Musculoskeletal: Full ROM all peripheral extremities, joint stability, 5/5 strength, and normal gait. Skin: Warm and dry without rashes, lesions, cyanosis, clubbing or  ecchymosis.  Neuro: Cranial nerves intact, reflexes equal bilaterally. Normal muscle tone, no cerebellar symptoms. Sensation intact.  Pysch: Alert and oriented x 3 with normal affect, insight and judgment appropriate.   Assessment and Plan  1. Annual Preventative/Screening Exam   2. Labile hypertension  - EKG 12-Lead - Urinalysis, Routine w reflex microscopic - Microalbumin / creatinine urine ratio - CBC with Differential/Platelet - COMPLETE METABOLIC PANEL WITH GFR - Magnesium - TSH  3. Hyperlipidemia, mixed  - Lipid panel - TSH  4. Abnormal glucose  - Insulin, random  5. Vitamin D deficiency  - VITAMIN D 25 Hydroxy   6. Testosterone deficiency  - Testosterone  7. Screening for colorectal cancer  - POC Hemoccult Bld/Stl   8. Prostate cancer screening  - PSA  9. Screening-pulmonary TB  - TB Skin Test  10. Screening for ischemic heart disease  - EKG 12-Lead  11. FH: hypertension  - EKG 12-Lead  12. Fatigue  - Iron, Total/Total Iron Binding Cap - Vitamin  B12 - Testosterone - CBC with Differential/Platelet - TSH  13. Medication management  - Urinalysis, Routine w reflex microscopic - Microalbumin / creatinine urine ratio - Testosterone - TB Skin Test - CBC with Differential/Platelet - COMPLETE METABOLIC PANEL WITH GFR - Magnesium - Lipid panel - TSH - Hemoglobin A1c        Patient was counseled in prudent diet, weight control to achieve/maintain BMI less than 25, BP monitoring, regular exercise and medications as discussed.  Discussed med effects and SE's. Routine screening labs and tests as requested with regular follow-up as recommended. Over 40 minutes of exam, counseling, chart review and high complex critical decision making was performed   Marinus Maw, MD

## 2023-04-01 NOTE — Patient Instructions (Signed)

## 2023-04-02 ENCOUNTER — Ambulatory Visit (INDEPENDENT_AMBULATORY_CARE_PROVIDER_SITE_OTHER): Payer: BC Managed Care – PPO | Admitting: Internal Medicine

## 2023-04-02 ENCOUNTER — Encounter: Payer: Self-pay | Admitting: Internal Medicine

## 2023-04-02 VITALS — BP 118/70 | HR 69 | Temp 97.7°F | Resp 16 | Ht 69.0 in | Wt 212.4 lb

## 2023-04-02 DIAGNOSIS — R350 Frequency of micturition: Secondary | ICD-10-CM

## 2023-04-02 DIAGNOSIS — Z1389 Encounter for screening for other disorder: Secondary | ICD-10-CM

## 2023-04-02 DIAGNOSIS — E559 Vitamin D deficiency, unspecified: Secondary | ICD-10-CM

## 2023-04-02 DIAGNOSIS — Z79899 Other long term (current) drug therapy: Secondary | ICD-10-CM

## 2023-04-02 DIAGNOSIS — E291 Testicular hypofunction: Secondary | ICD-10-CM

## 2023-04-02 DIAGNOSIS — Z130 Encounter for screening for diseases of the blood and blood-forming organs and certain disorders involving the immune mechanism: Secondary | ICD-10-CM | POA: Diagnosis not present

## 2023-04-02 DIAGNOSIS — Z136 Encounter for screening for cardiovascular disorders: Secondary | ICD-10-CM

## 2023-04-02 DIAGNOSIS — Z0001 Encounter for general adult medical examination with abnormal findings: Secondary | ICD-10-CM

## 2023-04-02 DIAGNOSIS — Z0000 Encounter for general adult medical examination without abnormal findings: Secondary | ICD-10-CM

## 2023-04-02 DIAGNOSIS — Z8249 Family history of ischemic heart disease and other diseases of the circulatory system: Secondary | ICD-10-CM

## 2023-04-02 DIAGNOSIS — Z13220 Encounter for screening for lipoid disorders: Secondary | ICD-10-CM

## 2023-04-02 DIAGNOSIS — E782 Mixed hyperlipidemia: Secondary | ICD-10-CM

## 2023-04-02 DIAGNOSIS — R0989 Other specified symptoms and signs involving the circulatory and respiratory systems: Secondary | ICD-10-CM | POA: Diagnosis not present

## 2023-04-02 DIAGNOSIS — Z1211 Encounter for screening for malignant neoplasm of colon: Secondary | ICD-10-CM

## 2023-04-02 DIAGNOSIS — Z111 Encounter for screening for respiratory tuberculosis: Secondary | ICD-10-CM

## 2023-04-02 DIAGNOSIS — R5383 Other fatigue: Secondary | ICD-10-CM

## 2023-04-02 DIAGNOSIS — R7309 Other abnormal glucose: Secondary | ICD-10-CM

## 2023-04-02 DIAGNOSIS — Z125 Encounter for screening for malignant neoplasm of prostate: Secondary | ICD-10-CM

## 2023-04-02 DIAGNOSIS — N401 Enlarged prostate with lower urinary tract symptoms: Secondary | ICD-10-CM

## 2023-04-02 DIAGNOSIS — Z131 Encounter for screening for diabetes mellitus: Secondary | ICD-10-CM

## 2023-04-02 DIAGNOSIS — Z1329 Encounter for screening for other suspected endocrine disorder: Secondary | ICD-10-CM

## 2023-04-03 ENCOUNTER — Other Ambulatory Visit: Payer: Self-pay | Admitting: Internal Medicine

## 2023-04-03 LAB — MICROALBUMIN / CREATININE URINE RATIO
Creatinine, Urine: 160 mg/dL (ref 20–320)
Microalb Creat Ratio: 4 mg/g{creat} (ref <30)
Microalb, Ur: 0.6 mg/dL

## 2023-04-03 LAB — URINALYSIS, ROUTINE W REFLEX MICROSCOPIC
Bacteria, UA: NONE SEEN /[HPF]
Bilirubin Urine: NEGATIVE
Glucose, UA: NEGATIVE
Hgb urine dipstick: NEGATIVE
Hyaline Cast: NONE SEEN /[LPF]
Ketones, ur: NEGATIVE
Leukocytes,Ua: NEGATIVE
Nitrite: NEGATIVE
Protein, ur: NEGATIVE
RBC / HPF: NONE SEEN /[HPF] (ref 0–2)
Specific Gravity, Urine: 1.016 (ref 1.001–1.035)
Squamous Epithelial / HPF: NONE SEEN /[HPF] (ref <=5)
WBC, UA: NONE SEEN /[HPF] (ref 0–5)
pH: 6 (ref 5.0–8.0)

## 2023-04-03 LAB — TSH: TSH: 0.7 m[IU]/L (ref 0.40–4.50)

## 2023-04-03 LAB — COMPLETE METABOLIC PANEL WITH GFR
AG Ratio: 1.9 (calc) (ref 1.0–2.5)
ALT: 18 U/L (ref 9–46)
AST: 19 U/L (ref 10–40)
Albumin: 4.4 g/dL (ref 3.6–5.1)
Alkaline phosphatase (APISO): 71 U/L (ref 36–130)
BUN: 10 mg/dL (ref 7–25)
CO2: 29 mmol/L (ref 20–32)
Calcium: 9.8 mg/dL (ref 8.6–10.3)
Chloride: 105 mmol/L (ref 98–110)
Creat: 1.08 mg/dL (ref 0.60–1.29)
Globulin: 2.3 g/dL (ref 1.9–3.7)
Glucose, Bld: 126 mg/dL — ABNORMAL HIGH (ref 65–99)
Potassium: 3.9 mmol/L (ref 3.5–5.3)
Sodium: 139 mmol/L (ref 135–146)
Total Bilirubin: 0.6 mg/dL (ref 0.2–1.2)
Total Protein: 6.7 g/dL (ref 6.1–8.1)
eGFR: 84 mL/min/{1.73_m2} (ref >=60)

## 2023-04-03 LAB — CBC WITH DIFFERENTIAL/PLATELET
Absolute Monocytes: 435 {cells}/uL (ref 200–950)
Basophils Absolute: 53 {cells}/uL (ref 0–200)
Basophils Relative: 0.7 %
Eosinophils Absolute: 90 {cells}/uL (ref 15–500)
Eosinophils Relative: 1.2 %
HCT: 50.1 % — ABNORMAL HIGH (ref 38.5–50.0)
Hemoglobin: 16.9 g/dL (ref 13.2–17.1)
Lymphs Abs: 1733 {cells}/uL (ref 850–3900)
MCH: 30.7 pg (ref 27.0–33.0)
MCHC: 33.7 g/dL (ref 32.0–36.0)
MCV: 90.9 fL (ref 80.0–100.0)
MPV: 12 fL (ref 7.5–12.5)
Monocytes Relative: 5.8 %
Neutro Abs: 5190 {cells}/uL (ref 1500–7800)
Neutrophils Relative %: 69.2 %
Platelets: 227 10*3/uL (ref 140–400)
RBC: 5.51 10*6/uL (ref 4.20–5.80)
RDW: 11.8 % (ref 11.0–15.0)
Total Lymphocyte: 23.1 %
WBC: 7.5 10*3/uL (ref 3.8–10.8)

## 2023-04-03 LAB — PSA: PSA: 2.03 ng/mL (ref <=4.00)

## 2023-04-03 LAB — HEMOGLOBIN A1C
Hgb A1c MFr Bld: 5.6 %{Hb} (ref <5.7)
Mean Plasma Glucose: 114 mg/dL
eAG (mmol/L): 6.3 mmol/L

## 2023-04-03 LAB — VITAMIN D 25 HYDROXY (VIT D DEFICIENCY, FRACTURES): Vit D, 25-Hydroxy: 83 ng/mL (ref 30–100)

## 2023-04-03 LAB — TESTOSTERONE: Testosterone: 933 ng/dL — ABNORMAL HIGH (ref 250–827)

## 2023-04-03 LAB — LIPID PANEL
Cholesterol: 137 mg/dL (ref <200)
HDL: 31 mg/dL — ABNORMAL LOW (ref >=40)
LDL Cholesterol (Calc): 74 mg/dL
Non-HDL Cholesterol (Calc): 106 mg/dL (ref <130)
Total CHOL/HDL Ratio: 4.4 (calc) (ref <5.0)
Triglycerides: 230 mg/dL — ABNORMAL HIGH (ref <150)

## 2023-04-03 LAB — IRON,TIBC AND FERRITIN PANEL
%SAT: 29 % (ref 20–48)
Ferritin: 104 ng/mL (ref 38–380)
Iron: 80 ug/dL (ref 50–180)
TIBC: 273 ug/dL (ref 250–425)

## 2023-04-03 LAB — VITAMIN B12: Vitamin B-12: 391 pg/mL (ref 200–1100)

## 2023-04-03 LAB — INSULIN, RANDOM: Insulin: 254 u[IU]/mL — ABNORMAL HIGH

## 2023-04-03 LAB — MAGNESIUM: Magnesium: 2 mg/dL (ref 1.5–2.5)

## 2023-04-03 NOTE — Progress Notes (Signed)
<>*<>*<>*<>*<>*<>*<>*<>*<>*<>*<>*<>*<>*<>*<>*<>*<>*<>*<>*<>*<>*<>*<>*<>*<> <>*<>*<>*<>*<>*<>*<>*<>*<>*<>*<>*<>*<>*<>*<>*<>*<>*<>*<>*<>*<>*<>*<>*<>*<>  -Test results slightly outside the reference range are not unusual. If there is anything important, I will review this with you,  otherwise it is considered normal test values.  If you have further questions,  please do not hesitate to contact me at the office or via My Chart.   <>*<>*<>*<>*<>*<>*<>*<>*<>*<>*<>*<>*<>*<>*<>*<>*<>*<>*<>*<>*<>*<>*<>*<>*<> <>*<>*<>*<>*<>*<>*<>*<>*<>*<>*<>*<>*<>*<>*<>*<>*<>*<>*<>*<>*<>*<>*<>*<>*<>  - Testosterone level  = 933  is slightly elevated which increases risk for   increased blood clotting as phlebitis in legs, blood clots to lungs, heart or brain or stimulating Prostate Cancer when level is too high .  - So recommend decrease your dose to 1/2 ml or 1/2 cc  every week or 7 days.   <>*<>*<>*<>*<>*<>*<>*<>*<>*<>*<>*<>*<>*<>*<>*<>*<>*<>*<>*<>*<>*<>*<>*<>*<> <>*<>*<>*<>*<>*<>*<>*<>*<>*<>*<>*<>*<>*<>*<>*<>*<>*<>*<>*<>*<>*<>*<>*<>*<>  -  Chol = 137  &  LDL = 74   - Both    Excellent   - Very low risk for Heart Attack  / Stroke  <>*<>*<>*<>*<>*<>*<>*<>*<>*<>*<>*<>*<>*<>*<>*<>*<>*<>*<>*<>*<>*<>*<>*<>*<> <>*<>*<>*<>*<>*<>*<>*<>*<>*<>*<>*<>*<>*<>*<>*<>*<>*<>*<>*<>*<>*<>*<>*<>*<>  -  But Triglycerides (  =  230  ) or fats in blood are too high                 (   Ideal or  Goal is less than 150  !  )    - Recommend avoid fried & greasy foods,  sweets / candy,   - Avoid white rice  (brown or wild rice or Quinoa is OK),   - Avoid white potatoes  (sweet potatoes are OK)   - Avoid anything made from white flour  - bagels, doughnuts, rolls, buns, biscuits, white and   wheat breads, pizza crust and traditional  pasta made of white flour & egg white  - (vegetarian pasta or spinach or wheat pasta is OK).    - Multi-grain bread is OK - like multi-grain flat bread or sandwich thins.   - Avoid alcohol in  excess.   - Exercise is also important.  <>*<>*<>*<>*<>*<>*<>*<>*<>*<>*<>*<>*<>*<>*<>*<>*<>*<>*<>*<>*<>*<>*<>*<>*<> <>*<>*<>*<>*<>*<>*<>*<>*<>*<>*<>*<>*<>*<>*<>*<>*<>*<>*<>*<>*<>*<>*<>*<>*<>  - Insulin = 254  ( normal is less than 20 )  shows insulin resistance   - a sign of early diabetes and associated with a 300 % greater risk for      heart attacks, strokes, cancer & Alzheimer type vascular dementia   - All this can be cured  and prevented with losing weight   - get Dr Francis Dowse Fuhrman's book 'the End of Diabetes" and   "the End of Dieting"  - Also   Cinnamon helps lower blood sugar, Insulin levels  & A1c   - Recommend "naturebell"  on [employer] Cinnamon                             Dose is 2 capsules = 9,000 mg                              240 capsules  is 4 month supply for $16.95   is about $4 / month  !    Also, taking 1/2 ounce  = 1 tablespoonful of Apple cider vinegar  in some liquid 2 x /day                                        Helps lower blood sugar  & Insulin levels !  <>*<>*<>*<>*<>*<>*<>*<>*<>*<>*<>*<>*<>*<>*<>*<>*<>*<>*<>*<>*<>*<>*<>*<>*<> <>*<>*<>*<>*<>*<>*<>*<>*<>*<>*<>*<>*<>*<>*<>*<>*<>*<>*<>*<>*<>*<>*<>*<>*<>  -  Vitamin B12 =   391  is   STILL    Very Low  (Ideal or Goal Vit B12 is between 450 - 1,100)   Low Vit B12 may be associated with Anemia , Fatigue,   Peripheral Neuropathy, Dementia, "Brain Fog", & Depression  - Recommend take a sub-lingual form of Vitamin B12 tablet   1,000 to 5,000 mcg tab that you dissolve under your tongue /Daily   - Can get Lavonia Dana - best price at [employer] or on [employer]  <>*<>*<>*<>*<>*<>*<>*<>*<>*<>*<>*<>*<>*<>*<>*<>*<>*<>*<>*<>*<>*<>*<>*<>*<> <>*<>*<>*<>*<>*<>*<>*<>*<>*<>*<>*<>*<>*<>*<>*<>*<>*<>*<>*<>*<>*<>*<>*<>*<>  -  Iron levels Normal /  OK  <>*<>*<>*<>*<>*<>*<>*<>*<>*<>*<>*<>*<>*<>*<>*<>*<>*<>*<>*<>*<>*<>*<>*<>*<> <>*<>*<>*<>*<>*<>*<>*<>*<>*<>*<>*<>*<>*<>*<>*<>*<>*<>*<>*<>*<>*<>*<>*<>*<>  - PSA - Low - No Prostate Cancer - Great  !  <>*<>*<>*<>*<>*<>*<>*<>*<>*<>*<>*<>*<>*<>*<>*<>*<>*<>*<>*<>*<>*<>*<>*<>*<> <>*<>*<>*<>*<>*<>*<>*<>*<>*<>*<>*<>*<>*<>*<>*<>*<>*<>*<>*<>*<>*<>*<>*<>*<>  -  A1c = 5.6 % is Normal & OK   <>*<>*<>*<>*<>*<>*<>*<>*<>*<>*<>*<>*<>*<>*<>*<>*<>*<>*<>*<>*<>*<>*<>*<>*<> <>*<>*<>*<>*<>*<>*<>*<>*<>*<>*<>*<>*<>*<>*<>*<>*<>*<>*<>*<>*<>*<>*<>*<>*<>  -  Vitamin D = 83 - Excellent    !       Please keep dose same   <>*<>*<>*<>*<>*<>*<>*<>*<>*<>*<>*<>*<>*<>*<>*<>*<>*<>*<>*<>*<>*<>*<>*<>*<> <>*<>*<>*<>*<>*<>*<>*<>*<>*<>*<>*<>*<>*<>*<>*<>*<>*<>*<>*<>*<>*<>*<>*<>*<>  -  All Else - CBC - Kidneys - Electrolytes - Liver - Magnesium & Thyroid    - all  Normal / OK  <>*<>*<>*<>*<>*<>*<>*<>*<>*<>*<>*<>*<>*<>*<>*<>*<>*<>*<>*<>*<>*<>*<>*<>*<> <>*<>*<>*<>*<>*<>*<>*<>*<>*<>*<>*<>*<>*<>*<>*<>*<>*<>*<>*<>*<>*<>*<>*<>*<>

## 2023-05-01 ENCOUNTER — Other Ambulatory Visit: Payer: Self-pay

## 2023-05-01 DIAGNOSIS — Z1211 Encounter for screening for malignant neoplasm of colon: Secondary | ICD-10-CM

## 2023-05-01 LAB — POC HEMOCCULT BLD/STL (HOME/3-CARD/SCREEN)
Card #2 Fecal Occult Blod, POC: NEGATIVE
Card #3 Fecal Occult Blood, POC: NEGATIVE
Fecal Occult Blood, POC: NEGATIVE

## 2023-05-02 DIAGNOSIS — Z1212 Encounter for screening for malignant neoplasm of rectum: Secondary | ICD-10-CM | POA: Diagnosis not present

## 2023-05-02 DIAGNOSIS — Z1211 Encounter for screening for malignant neoplasm of colon: Secondary | ICD-10-CM | POA: Diagnosis not present

## 2023-07-06 NOTE — Progress Notes (Deleted)
 FOLLOW UP  Assessment and Plan:   Hypertension Fairly controlled at this time off of medications Monitor blood pressure at home; patient to call if consistently greater than 130/80 Continue DASH diet.   Reminder to go to the ER if any CP, SOB, nausea, dizziness, severe HA, changes vision/speech, left arm numbness and tingling and jaw pain. Check CBC  Cholesterol Currently at goal on atorvastatin  LDL goal <100 Continue low cholesterol diet and exercise.  Check lipid panel.  Check CMP, TSH  Other abnormal glucose Recent A1Cs normalized Continue diet and exercise.  Perform daily foot/skin check, notify office of any concerning changes.  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  Suggested addition of zinc 50 mg daily, work on weight loss  Tobacco use Discussed risks associated with tobacco use and advised to reduce or quit Patient is not ready to do so, but advised to consider strongly Will follow up at the next visit  Flu Vaccine need Flu vaccine QUAD 6+ MOS PF IM given   Continue diet and meds as discussed. Further disposition pending results of labs. Discussed med's effects and SE's.   Over 30 minutes of exam, counseling, chart review, and critical decision making was performed.   Future Appointments  Date Time Provider Department Center  07/09/2023  3:30 PM Neil Pickar E, NP [facility] None  10/08/2023   3:30 PM Neil Fallow, MD [facility] None  01/14/2024  3:30 PM Neil Henderson E, NP [facility] None  04/28/2024  3:00 PM Neil Fallow, MD [facility] None    ----------------------------------------------------------------------------------------------------------------------  HPI 48 y.o. male  presents for 3 month follow up on hypertension, cholesterol, glucose management (hx of prediabetes), obesity, testosterone  deficiency and vitamin D  deficiency.   Difficulty falling asleep, takes melatonin which works well intermittently. Ongoing since childhood. Admits watching TV in bedroom prior to bedtime - discussed sleep hygiene.   he currently continues to smoke 1- 11/2 pack a day; since 1989, 32 pack year history, discussed risks associated with smoking, patient is not ready to quit. Will consider in the future, father and sister had lung cancer 02/2021 and 04/2021  BMI is There is no height or weight on file to calculate BMI., he has new job, more physically active, delivering uniforms, lifting and walking. Admits eating bad food, fast food, snacking, but cutting down on portions and stopping eating later.  Wt Readings from Last 3 Encounters:  04/02/23 212 lb 6.4 oz (96.3 kg)  07/10/22 217 lb (98.4 kg)  12/19/21 197 lb 12.8 oz (89.7 kg)   He BP is controlled at home, 120s/80s, today their BP is   BP Readings from Last 3 Encounters:  04/02/23 118/70  07/10/22 138/80  12/19/21 138/90     He does not workout. He denies chest pain, shortness of breath, dizziness.   He is on cholesterol medication (atorvastatin  80 mg daily) and denies myalgias. His cholesterol is at goal. The cholesterol last visit was:   Lab Results  Component  Value Date   CHOL 137 04/02/2023   HDL 31 (L) 04/02/2023   LDLCALC 74 04/02/2023   TRIG 230 (H) 04/02/2023   CHOLHDL 4.4 04/02/2023    He has been working on diet and exercise for glucose management (hx of prediabetes), and denies increased appetite, nausea,  paresthesia of the feet, polydipsia, polyuria, visual disturbances and vomiting. Last A1C in the office was:  Lab Results  Component Value Date   HGBA1C 5.6 04/02/2023   Patient is on Vitamin D  supplement and below goal at recent check, was off of supplement, back taking 89999 IU which had him at goal previous:    Lab Results  Component Value Date   VD25OH 23 04/02/2023     He has a history of testosterone  deficiency and is on testosterone  replacement, taking 200 mg IM every 2 weeks. He states that the testosterone  helps with his energy, libido, muscle mass. Lab Results  Component Value Date   TESTOSTERONE  933 (H) 04/02/2023      Current Medications:  Current Outpatient Medications on File Prior to Visit  Medication Sig   acetaminophen  (TYLENOL ) 325 MG tablet Take 650 mg by mouth every 6 (six) hours as needed.   aspirin  EC 81 MG tablet Take 81 mg by mouth daily.   atorvastatin  (LIPITOR) 80 MG tablet Take  1 tablet  Daily  for Cholesterol   cephALEXin  (KEFLEX ) 500 MG capsule Take  1 capsule  3 x /day  with Meals for Infection   cholecalciferol  (VITAMIN D ) 1000 units tablet Take 10,000 Units by mouth daily.   tadalafil  (CIALIS ) 20 MG tablet Take 1/2 to 1 tablet every 2 to 3 days as needed for XXXX   testosterone  cypionate (DEPOTESTOSTERONE CYPIONATE) 200 MG/ML injection Inject  1 ml  into Muscle  each Week (7 days)   No current facility-administered medications on file prior to visit.     Allergies: No Known Allergies   Medical History:  Past Medical History:  Diagnosis Date   Hyperlipidemia    Hypertension    Hypogonadism male    Obesity    Prediabetes    Vitamin D  deficiency    Family history- Reviewed and unchanged Social history- Reviewed and unchanged   Review of Systems:  Review of Systems  Constitutional:  Negative for chills, diaphoresis, fever, malaise/fatigue and weight loss.  HENT:  Negative for congestion, ear discharge, ear pain, hearing loss, sinus  pain, sore throat and tinnitus.   Eyes:  Negative for blurred vision and double vision.  Respiratory:  Negative for cough, sputum production, shortness of breath and wheezing.   Cardiovascular:  Negative for chest pain, palpitations, orthopnea, claudication and leg swelling.  Gastrointestinal:  Negative for abdominal pain, blood in stool, constipation, diarrhea, heartburn, melena, nausea and vomiting.  Genitourinary: Negative.   Musculoskeletal:  Negative for joint pain and myalgias.  Skin:  Negative for rash.  Neurological:  Negative for dizziness, tingling, sensory change, weakness and headaches.  Endo/Heme/Allergies:  Negative for environmental allergies and polydipsia.  Psychiatric/Behavioral:  Negative for depression, substance abuse and suicidal ideas. The patient is not nervous/anxious and does not have insomnia.   All other systems reviewed and are negative.     Physical Exam: There were no vitals taken for this visit. Wt Readings from Last 3 Encounters:  04/02/23 212 lb 6.4 oz (96.3 kg)  07/10/22 217 lb (98.4 kg)  12/19/21 197 lb 12.8 oz (89.7 kg)   General Appearance: Well nourished, in no apparent distress. Eyes: PERRLA, EOMs,  conjunctiva no swelling or erythema Sinuses: No Frontal/maxillary tenderness ENT/Mouth: Ext aud canals clear, TMs without erythema, bulging. No erythema, swelling, or exudate on post pharynx.  Tonsils not swollen or erythematous. Hearing normal.  Neck: Supple, thyroid  normal.  Respiratory: Respiratory effort normal, BS equal bilaterally without rales, rhonchi, wheezing or stridor.  Cardio: RRR with no MRGs. Brisk peripheral pulses without edema.  Abdomen: Soft, + BS.  Non tender, no guarding, rebound, hernias, masses. Lymphatics: Non tender without lymphadenopathy.  Musculoskeletal: Full ROM, 5/5 strength, Normal gait Skin: Warm, dry without rashes, lesions, ecchymosis.  Neuro: Cranial nerves intact. No cerebellar symptoms.  Psych: Awake and  oriented X 3, normal affect, Insight and Judgment appropriate.    Bonny Egger Henderson Araf Clugston, NP 11:37 AM [facility]

## 2023-07-09 ENCOUNTER — Ambulatory Visit: Payer: BC Managed Care – PPO | Admitting: Nurse Practitioner

## 2023-07-09 DIAGNOSIS — R0989 Other specified symptoms and signs involving the circulatory and respiratory systems: Secondary | ICD-10-CM

## 2023-07-09 DIAGNOSIS — E782 Mixed hyperlipidemia: Secondary | ICD-10-CM

## 2023-07-09 DIAGNOSIS — E559 Vitamin D deficiency, unspecified: Secondary | ICD-10-CM

## 2023-07-09 DIAGNOSIS — Z79899 Other long term (current) drug therapy: Secondary | ICD-10-CM

## 2023-07-09 DIAGNOSIS — E291 Testicular hypofunction: Secondary | ICD-10-CM

## 2023-07-09 DIAGNOSIS — F17211 Nicotine dependence, cigarettes, in remission: Secondary | ICD-10-CM

## 2023-07-09 DIAGNOSIS — R7309 Other abnormal glucose: Secondary | ICD-10-CM

## 2023-07-10 ENCOUNTER — Ambulatory Visit: Payer: BC Managed Care – PPO | Admitting: Nurse Practitioner

## 2023-08-13 ENCOUNTER — Emergency Department (HOSPITAL_COMMUNITY): Payer: BC Managed Care – PPO

## 2023-08-13 ENCOUNTER — Other Ambulatory Visit: Payer: Self-pay

## 2023-08-13 ENCOUNTER — Encounter (HOSPITAL_COMMUNITY): Payer: Self-pay

## 2023-08-13 ENCOUNTER — Inpatient Hospital Stay (HOSPITAL_COMMUNITY)
Admission: EM | Admit: 2023-08-13 | Discharge: 2023-08-17 | DRG: 417 | Disposition: A | Discharge status: Home / Self Care | Payer: BC Managed Care – PPO | Attending: Internal Medicine | Admitting: Internal Medicine

## 2023-08-13 DIAGNOSIS — K810 Acute cholecystitis: Secondary | ICD-10-CM | POA: Diagnosis not present

## 2023-08-13 DIAGNOSIS — Z981 Arthrodesis status: Secondary | ICD-10-CM | POA: Diagnosis not present

## 2023-08-13 DIAGNOSIS — N10 Acute tubulo-interstitial nephritis: Secondary | ICD-10-CM

## 2023-08-13 DIAGNOSIS — Z79899 Other long term (current) drug therapy: Secondary | ICD-10-CM | POA: Diagnosis not present

## 2023-08-13 DIAGNOSIS — Z83438 Family history of other disorder of lipoprotein metabolism and other lipidemia: Secondary | ICD-10-CM

## 2023-08-13 DIAGNOSIS — R7303 Prediabetes: Secondary | ICD-10-CM | POA: Diagnosis present

## 2023-08-13 DIAGNOSIS — K812 Acute cholecystitis with chronic cholecystitis: Secondary | ICD-10-CM | POA: Diagnosis not present

## 2023-08-13 DIAGNOSIS — K8012 Calculus of gallbladder with acute and chronic cholecystitis without obstruction: Principal | ICD-10-CM | POA: Diagnosis present

## 2023-08-13 DIAGNOSIS — Z1152 Encounter for screening for COVID-19: Secondary | ICD-10-CM

## 2023-08-13 DIAGNOSIS — K760 Fatty (change of) liver, not elsewhere classified: Secondary | ICD-10-CM | POA: Diagnosis not present

## 2023-08-13 DIAGNOSIS — E785 Hyperlipidemia, unspecified: Secondary | ICD-10-CM | POA: Diagnosis present

## 2023-08-13 DIAGNOSIS — K824 Cholesterolosis of gallbladder: Secondary | ICD-10-CM | POA: Diagnosis not present

## 2023-08-13 DIAGNOSIS — E290 Testicular hyperfunction: Secondary | ICD-10-CM | POA: Diagnosis not present

## 2023-08-13 DIAGNOSIS — N289 Disorder of kidney and ureter, unspecified: Secondary | ICD-10-CM | POA: Diagnosis not present

## 2023-08-13 DIAGNOSIS — K819 Cholecystitis, unspecified: Secondary | ICD-10-CM | POA: Diagnosis not present

## 2023-08-13 DIAGNOSIS — E669 Obesity, unspecified: Secondary | ICD-10-CM | POA: Diagnosis not present

## 2023-08-13 DIAGNOSIS — N2889 Other specified disorders of kidney and ureter: Secondary | ICD-10-CM | POA: Diagnosis not present

## 2023-08-13 DIAGNOSIS — N281 Cyst of kidney, acquired: Secondary | ICD-10-CM | POA: Diagnosis present

## 2023-08-13 DIAGNOSIS — K8020 Calculus of gallbladder without cholecystitis without obstruction: Secondary | ICD-10-CM | POA: Diagnosis not present

## 2023-08-13 DIAGNOSIS — Z6831 Body mass index (BMI) 31.0-31.9, adult: Secondary | ICD-10-CM

## 2023-08-13 DIAGNOSIS — N12 Tubulo-interstitial nephritis, not specified as acute or chronic: Secondary | ICD-10-CM | POA: Diagnosis not present

## 2023-08-13 DIAGNOSIS — F1010 Alcohol abuse, uncomplicated: Secondary | ICD-10-CM | POA: Diagnosis not present

## 2023-08-13 DIAGNOSIS — F17210 Nicotine dependence, cigarettes, uncomplicated: Secondary | ICD-10-CM | POA: Diagnosis not present

## 2023-08-13 DIAGNOSIS — Z801 Family history of malignant neoplasm of trachea, bronchus and lung: Secondary | ICD-10-CM | POA: Diagnosis not present

## 2023-08-13 DIAGNOSIS — I10 Essential (primary) hypertension: Secondary | ICD-10-CM | POA: Diagnosis present

## 2023-08-13 DIAGNOSIS — K828 Other specified diseases of gallbladder: Secondary | ICD-10-CM | POA: Diagnosis not present

## 2023-08-13 DIAGNOSIS — N151 Renal and perinephric abscess: Secondary | ICD-10-CM | POA: Diagnosis present

## 2023-08-13 DIAGNOSIS — Z7982 Long term (current) use of aspirin: Secondary | ICD-10-CM | POA: Diagnosis not present

## 2023-08-13 DIAGNOSIS — R109 Unspecified abdominal pain: Secondary | ICD-10-CM | POA: Diagnosis not present

## 2023-08-13 DIAGNOSIS — K8010 Calculus of gallbladder with chronic cholecystitis without obstruction: Secondary | ICD-10-CM | POA: Diagnosis not present

## 2023-08-13 DIAGNOSIS — E782 Mixed hyperlipidemia: Secondary | ICD-10-CM | POA: Diagnosis present

## 2023-08-13 DIAGNOSIS — R1084 Generalized abdominal pain: Secondary | ICD-10-CM | POA: Diagnosis not present

## 2023-08-13 LAB — COMPREHENSIVE METABOLIC PANEL
ALT: 18 U/L (ref 0–44)
AST: 20 U/L (ref 15–41)
Albumin: 4.4 g/dL (ref 3.5–5.0)
Alkaline Phosphatase: 84 U/L (ref 38–126)
Anion gap: 9 (ref 5–15)
BUN: 12 mg/dL (ref 6–20)
CO2: 25 mmol/L (ref 22–32)
Calcium: 9.7 mg/dL (ref 8.9–10.3)
Chloride: 102 mmol/L (ref 98–111)
Creatinine, Ser: 0.85 mg/dL (ref 0.61–1.24)
GFR, Estimated: >60 mL/min (ref >60)
Glucose, Bld: 114 mg/dL — ABNORMAL HIGH (ref 70–99)
Potassium: 4.2 mmol/L (ref 3.5–5.1)
Sodium: 136 mmol/L (ref 135–145)
Total Bilirubin: 1.2 mg/dL (ref 0.0–1.2)
Total Protein: 7.9 g/dL (ref 6.5–8.1)

## 2023-08-13 LAB — CBC WITH DIFFERENTIAL/PLATELET
Abs Immature Granulocytes: 0.04 10*3/uL (ref 0.00–0.07)
Basophils Absolute: 0.1 10*3/uL (ref 0.0–0.1)
Basophils Relative: 0 %
Eosinophils Absolute: 0.1 10*3/uL (ref 0.0–0.5)
Eosinophils Relative: 1 %
HCT: 48 % (ref 39.0–52.0)
Hemoglobin: 16.4 g/dL (ref 13.0–17.0)
Immature Granulocytes: 0 %
Lymphocytes Relative: 11 %
Lymphs Abs: 1.2 10*3/uL (ref 0.7–4.0)
MCH: 30.9 pg (ref 26.0–34.0)
MCHC: 34.2 g/dL (ref 30.0–36.0)
MCV: 90.4 fL (ref 80.0–100.0)
Monocytes Absolute: 1 10*3/uL (ref 0.1–1.0)
Monocytes Relative: 9 %
Neutro Abs: 8.8 10*3/uL — ABNORMAL HIGH (ref 1.7–7.7)
Neutrophils Relative %: 79 %
Platelets: 239 10*3/uL (ref 150–400)
RBC: 5.31 MIL/uL (ref 4.22–5.81)
RDW: 11.9 % (ref 11.5–15.5)
WBC: 11.2 10*3/uL — ABNORMAL HIGH (ref 4.0–10.5)
nRBC: 0 % (ref 0.0–0.2)

## 2023-08-13 LAB — URINALYSIS, W/ REFLEX TO CULTURE (INFECTION SUSPECTED)
Bacteria, UA: NONE SEEN
Bilirubin Urine: NEGATIVE
Glucose, UA: NEGATIVE mg/dL
Hgb urine dipstick: NEGATIVE
Ketones, ur: NEGATIVE mg/dL
Leukocytes,Ua: NEGATIVE
Nitrite: NEGATIVE
Protein, ur: NEGATIVE mg/dL
Specific Gravity, Urine: >1.046 — ABNORMAL HIGH (ref 1.005–1.030)
pH: 6 (ref 5.0–8.0)

## 2023-08-13 LAB — RESP PANEL BY RT-PCR (RSV, FLU A&B, COVID)  RVPGX2
Influenza A by PCR: NEGATIVE
Influenza B by PCR: NEGATIVE
Resp Syncytial Virus by PCR: NEGATIVE
SARS Coronavirus 2 by RT PCR: NEGATIVE

## 2023-08-13 LAB — LIPASE, BLOOD: Lipase: 43 U/L (ref 11–51)

## 2023-08-13 MED ORDER — ONDANSETRON HCL 4 MG PO TABS: 4.0000 mg | ORAL_TABLET | Freq: Four times a day (QID) | ORAL | Status: DC | PRN

## 2023-08-13 MED ORDER — HYDROMORPHONE HCL 1 MG/ML IJ SOLN
0.5000 mg | Freq: Once | INTRAMUSCULAR | Status: AC
  Administered 2023-08-13: 0.5 mg via INTRAVENOUS
  Filled 2023-08-13: qty 1

## 2023-08-13 MED ORDER — LACTATED RINGERS IV SOLN: INTRAVENOUS | Status: DC

## 2023-08-13 MED ORDER — SODIUM CHLORIDE 0.9 % IV SOLN
2.0000 g | INTRAVENOUS | Status: DC
  Administered 2023-08-13 – 2023-08-16 (×5): 2 g via INTRAVENOUS
  Filled 2023-08-13 (×5): qty 20

## 2023-08-13 MED ORDER — MORPHINE SULFATE (PF) 4 MG/ML IV SOLN
4.0000 mg | INTRAVENOUS | Status: DC | PRN
  Administered 2023-08-13 – 2023-08-14 (×3): 4 mg via INTRAVENOUS
  Filled 2023-08-13 (×3): qty 1

## 2023-08-13 MED ORDER — ONDANSETRON HCL 4 MG/2ML IJ SOLN
4.0000 mg | Freq: Once | INTRAMUSCULAR | Status: AC
  Administered 2023-08-13: 4 mg via INTRAVENOUS
  Filled 2023-08-13: qty 2

## 2023-08-13 MED ORDER — ACETAMINOPHEN 650 MG RE SUPP: 650.0000 mg | Freq: Four times a day (QID) | RECTAL | Status: DC | PRN

## 2023-08-13 MED ORDER — IOHEXOL 300 MG/ML  SOLN
100.0000 mL | Freq: Once | INTRAMUSCULAR | Status: AC | PRN
  Administered 2023-08-13: 100 mL via INTRAVENOUS

## 2023-08-13 MED ORDER — OXYCODONE-ACETAMINOPHEN 5-325 MG PO TABS
1.0000 | ORAL_TABLET | Freq: Once | ORAL | Status: AC
  Administered 2023-08-13: 1 via ORAL
  Filled 2023-08-13: qty 1

## 2023-08-13 MED ORDER — ACETAMINOPHEN 325 MG PO TABS: 650.0000 mg | ORAL_TABLET | Freq: Four times a day (QID) | ORAL | Status: DC | PRN

## 2023-08-13 MED ORDER — FENTANYL CITRATE PF 50 MCG/ML IJ SOSY
50.0000 ug | PREFILLED_SYRINGE | Freq: Once | INTRAMUSCULAR | Status: AC
  Administered 2023-08-13: 50 ug via INTRAVENOUS
  Filled 2023-08-13: qty 1

## 2023-08-13 MED ORDER — ATORVASTATIN CALCIUM 20 MG PO TABS
80.0000 mg | ORAL_TABLET | Freq: Every day | ORAL | Status: DC
  Administered 2023-08-14 – 2023-08-17 (×4): 80 mg via ORAL
  Filled 2023-08-13 (×4): qty 4

## 2023-08-13 MED ORDER — ONDANSETRON HCL 4 MG/2ML IJ SOLN
4.0000 mg | Freq: Four times a day (QID) | INTRAMUSCULAR | Status: DC | PRN
  Administered 2023-08-15: 4 mg via INTRAVENOUS
  Filled 2023-08-13: qty 2

## 2023-08-13 NOTE — ED Provider Triage Note (Signed)
Emergency Medicine Provider Triage Evaluation Note  Neil Henderson , a 48 y.o. male  was evaluated in triage.  Pt complains of abd pain x 1 day, R sided.  Review of Systems  Positive: Abd pain Negative: diarrhea  Physical Exam  BP (!) 151/86 (BP Location: Left Arm)   Pulse 85   Temp 97.9 F (36.6 C) (Oral)   Resp 18   Ht 5\' 9"  (1.753 m)   Wt 96.3 kg   SpO2 99%   BMI 31.35 kg/m  Gen:   Awake, no distress   Resp:  Normal effort  MSK:   Moves extremities without difficulty  Other:    Medical Decision Making  Medically screening exam initiated at 8:55 AM.  Appropriate orders placed.  Neil Henderson was informed that the remainder of the evaluation will be completed by another provider, this initial triage assessment does not replace that evaluation, and the importance of remaining in the ED until their evaluation is complete.  RUQ [place] ordered.   Derwood Kaplan, MD 08/13/23 [phone]

## 2023-08-13 NOTE — H&P (Addendum)
Neil Henderson 1975/11/17  [id number].    Requesting MD: Rhunette Croft, MD Chief Complaint/Reason for Consult: abdominal pain, possible cholecystitis   HPI:  Neil Henderson is a 48 y/o M with PMH HLD, hypergonadism, HTN, Obesity, prediabetes and alcohol use disorder who presents with abdominal pain. Pain started two days ago. Initially reports pain was epigastric/periumbilical but now more localized to right side. Associated diarrhea x 1 along with nausea without vomiting. No hx of similar episodes. Pain is not postprandial pain. Also with some R flank pain and feels his urine has been different but is unable to describe how and denies dysuria or hematuria. No fever, cp, sob. Does have a dry cough. No prior abdominal surgery. Not on blood thinners. NKDA.  Reports heavy alcohol use in the past but over the last few years has decreased intake to 1-2 drinks a month.  No frequent NSAID use but has been using ibuprofen over the last few days.  Quit smoking in January.  Works for [employer]. No prior colonoscopy.  No recent travel, sick contacts or recent antibiotic use.  ROS: Negative other than HPI  Family History  Problem Relation Age of Onset   Hyperlipidemia Mother    Hyperlipidemia Father    Cancer Paternal Grandfather        Lung    Past Medical History:  Diagnosis Date   Hyperlipidemia    Hypertension    Hypogonadism male    Obesity    Prediabetes    Vitamin D deficiency     Past Surgical History:  Procedure Laterality Date   CERVICAL FUSION      Social History:  reports that he has quit smoking. His smoking use included cigarettes. He started smoking about 36 years ago. He has a 36.1 pack-year smoking history. He has never used smokeless tobacco. He reports current alcohol use of about 4.0 standard drinks of alcohol per week. He reports that he does not currently use drugs after having used the following drugs: Marijuana. Frequency: 2.00 times per week.  Allergies: No  Known Allergies  (Not in a hospital admission)    Physical Exam: Blood pressure (!) 150/82, pulse 63, temperature 97.6 F (36.4 C), temperature source Oral, resp. rate 15, height 5\' 9"  (1.753 m), weight 96.3 kg, SpO2 98%. General: pleasant, WD, male who is laying in bed in NAD HEENT: head is normocephalic, atraumatic.  Sclera are noninjected.   Heart: regular, rate, and rhythm.   Lungs: CTAB, no wheezes, rhonchi, or rales noted.  Respiratory effort nonlabored Abd: Soft, RLQ>RUQ abdominal pain, no peritonitis. ND, +BS, no masses, hernias, or organomegaly MS: No LE edema Skin: warm and dry with no masses, lesions, or rashes Neuro: Cranial nerves 2-12 grossly intact, sensation is normal throughout Psych: A&Ox3 with an appropriate affect.   Results for orders placed or performed during the hospital encounter of 08/13/23 (from the past 48 hours)  Resp panel by RT-PCR (RSV, Flu A&B, Covid) Anterior Nasal Swab     Status: None   Collection Time: 08/13/23  8:58 AM   Specimen: Anterior Nasal Swab  Result Value Ref Range   SARS Coronavirus 2 by RT PCR NEGATIVE NEGATIVE    Comment: (NOTE) SARS-CoV-2 target nucleic acids are NOT DETECTED.  The SARS-CoV-2 RNA is generally detectable in upper respiratory specimens during the acute phase of infection. The lowest concentration of SARS-CoV-2 viral copies this assay can detect is 138 copies/mL. A negative result does not preclude SARS-Cov-2 infection and should not  be used as the sole basis for treatment or other patient management decisions. A negative result may occur with  improper specimen collection/handling, submission of specimen other than nasopharyngeal swab, presence of viral mutation(s) within the areas targeted by this assay, and inadequate number of viral copies(<138 copies/mL). A negative result must be combined with clinical observations, patient history, and epidemiological information. The expected result is Negative.  Fact  Sheet for Patients:  BloggerCourse.com  Fact Sheet for Healthcare Providers:  SeriousBroker.it  This test is no t yet approved or cleared by the [place] FDA and  has been authorized for detection and/or diagnosis of SARS-CoV-2 by FDA under an Emergency Use Authorization (EUA). This EUA will remain  in effect (meaning this test can be used) for the duration of the COVID-19 declaration under Section 564(b)(1) of the Act, 21 U.S.C.section 360bbb-3(b)(1), unless the authorization is terminated  or revoked sooner.       Influenza A by PCR NEGATIVE NEGATIVE   Influenza B by PCR NEGATIVE NEGATIVE    Comment: (NOTE) The Xpert Xpress SARS-CoV-2/FLU/RSV plus assay is intended as an aid in the diagnosis of influenza from Nasopharyngeal swab specimens and should not be used as a sole basis for treatment. Nasal washings and aspirates are unacceptable for Xpert Xpress SARS-CoV-2/FLU/RSV testing.  Fact Sheet for Patients: BloggerCourse.com  Fact Sheet for Healthcare Providers: SeriousBroker.it  This test is not yet approved or cleared by the [place] FDA and has been authorized for detection and/or diagnosis of SARS-CoV-2 by FDA under an Emergency Use Authorization (EUA). This EUA will remain in effect (meaning this test can be used) for the duration of the COVID-19 declaration under Section 564(b)(1) of the Act, 21 U.S.C. section 360bbb-3(b)(1), unless the authorization is terminated or revoked.     Resp Syncytial Virus by PCR NEGATIVE NEGATIVE    Comment: (NOTE) Fact Sheet for Patients: BloggerCourse.com  Fact Sheet for Healthcare Providers: SeriousBroker.it  This test is not yet approved or cleared by the [place] FDA and has been authorized for detection and/or diagnosis of SARS-CoV-2 by FDA under an Emergency Use  Authorization (EUA). This EUA will remain in effect (meaning this test can be used) for the duration of the COVID-19 declaration under Section 564(b)(1) of the Act, 21 U.S.C. section 360bbb-3(b)(1), unless the authorization is terminated or revoked.  Performed at [facility], [address] [place], [place] [address]   Comprehensive metabolic panel     Status: Abnormal   Collection Time: 08/13/23  9:03 AM  Result Value Ref Range   Sodium 136 135 - 145 mmol/L   Potassium 4.2 3.5 - 5.1 mmol/L   Chloride 102 98 - 111 mmol/L   CO2 25 22 - 32 mmol/L   Glucose, Bld 114 (H) 70 - 99 mg/dL    Comment: Glucose reference range applies only to samples taken after fasting for at least 8 hours.   BUN 12 6 - 20 mg/dL   Creatinine, Ser 6.04 0.61 - 1.24 mg/dL   Calcium 9.7 8.9 - 54.0 mg/dL   Total Protein 7.9 6.5 - 8.1 g/dL   Albumin 4.4 3.5 - 5.0 g/dL   AST 20 15 - 41 U/L   ALT 18 0 - 44 U/L   Alkaline Phosphatase 84 38 - 126 U/L   Total Bilirubin 1.2 0.0 - 1.2 mg/dL   GFR, Estimated >98 >11 mL/min    Comment: (NOTE) Calculated using the CKD-EPI Creatinine Equation (2021)    Anion gap 9  5 - 15    Comment: Performed at [facility], [address], [place], [place] [address]  Lipase, blood     Status: None   Collection Time: 08/13/23  9:03 AM  Result Value Ref Range   Lipase 43 11 - 51 U/L    Comment: Performed at [facility], [address], [place], [place] [address]  CBC with Differential     Status: Abnormal   Collection Time: 08/13/23  9:03 AM  Result Value Ref Range   WBC 11.2 (H) 4.0 - 10.5 K/uL   RBC 5.31 4.22 - 5.81 MIL/uL   Hemoglobin 16.4 13.0 - 17.0 g/dL   HCT 14.7 82.9 - 56.2 %   MCV 90.4 80.0 - 100.0 fL   MCH 30.9 26.0 - 34.0 pg   MCHC 34.2 30.0 - 36.0 g/dL   RDW 13.0 86.5 - 78.4 %   Platelets 239 150 - 400 K/uL   nRBC 0.0 0.0 - 0.2 %   Neutrophils Relative % 79 %   Neutro Abs 8.8 (H) 1.7 - 7.7 K/uL    Lymphocytes Relative 11 %   Lymphs Abs 1.2 0.7 - 4.0 K/uL   Monocytes Relative 9 %   Monocytes Absolute 1.0 0.1 - 1.0 K/uL   Eosinophils Relative 1 %   Eosinophils Absolute 0.1 0.0 - 0.5 K/uL   Basophils Relative 0 %   Basophils Absolute 0.1 0.0 - 0.1 K/uL   Immature Granulocytes 0 %   Abs Immature Granulocytes 0.04 0.00 - 0.07 K/uL    Comment: Performed at [facility], [address], [place], [place] [address]   US Abdomen Limited RUQ (LIVER/GB) Result Date: 08/13/2023 CLINICAL DATA:  [id number] RUQ abdominal pain [id number] EXAM: ULTRASOUND ABDOMEN LIMITED RIGHT UPPER QUADRANT COMPARISON:  None Available. FINDINGS: Gallbladder: Physiologically distended. There are multiple sessile gallbladder polyps with largest measuring up to 5 x 7 mm. No associated focal wall thickening. However, the gallbladder wall is diffusely thickened measuring up to 3.5 mm. No pericholecystic free fluid. Sonographic Murphy's sign was positive as per the technologist. Common bile duct: Diameter: Less than 4 mm.  No intrahepatic bile duct dilation. Liver: No focal lesion identified. Within normal limits in parenchymal echogenicity. Portal vein is patent on color Doppler imaging with normal direction of blood flow towards the liver. Other: None. IMPRESSION: 1. There are multiple sessile gallbladder polyps, which can be characterized as low risk polyps. Largest polyp measured up to 5 x 7 mm. Follow-up ultrasound is recommended in 1 year to document stability. Please see gallbladder follow-up reference below. 2. Partially distended gallbladder and no pericholecystic free fluid; however, exhibiting mild diffuse wall thickening and technologist noted positive sonographic Murphy's sign. Findings are equivocal for acute cholecystitis but considered less likely. Correlate clinically to determine the need for additional imaging. 3. Otherwise unremarkable exam. Management of Incidentally Detected Gallbladder Polyps: Society  of Radiologists in Ultrasound Consensus Conference Recommendations (published online December 28, 2020): SkateboardingTeam.com.[email or web address].528413 Extremely low-risk polyps, i.e. pedunculated ball-on-the-wall or thin stalk: <9 mm: no follow-up 10-14 mm: follow-up at 6, 12 and 24 months >15 mm: surgical consult Low-risk polyps, i.e. pedunculated with a thick or wide stalk, or sessile: ?6 mm: no follow-up 7-9 mm follow-up ultrasound at 12 months 10-14 mm: follow-up ultrasound at 6, 12, 24, and 36 months vs surgical consult >15 mm: surgical consult Intermediate risk: focal wall-thickening >15mm adjacent to polyp: <6 mm: follow-up at 6, 12, 24, 36 months vs surgical consult >7 mm:  surgical consult Electronically Signed   By: Jules Schick M.D.   On: 08/13/2023 11:12   Anti-infectives (From admission, onward)    None         Assessment/Plan R sided abdominal pain  Gallbladder polyps, possible cholecystitis - [place] with mild diffuse wall thickening and multiple sessile polyps, positive sonographic murphy sign - Agree with CT to r/o other etiologies. This is pending. Will follow up - Mild leukocytosis of 11.2, afebrile and HD stable - lipase and LFTs normal  - Case reviewed with attending. Recommend admission to [facility] while patient undergoes further workup.  Okay to cover with antibiotics.  Pending results from CT will have further discussions with patient about possible OR during this admission. Discussed w/ EDP. Please await CT, if this is negative, okay to start cld and make npo at midnight.   I reviewed ED provider notes, last 24 h vitals and pain scores, last 48 h intake and output, last 24 h labs and trends, and last 24 h imaging results.  Leary Roca, [facility] 08/13/2023, 2:52 PM Please see Amion for pager number during day hours 7:00am-4:30pm or 7:00am -11:30am on weekends

## 2023-08-13 NOTE — ED Provider Notes (Signed)
[place] EMERGENCY DEPARTMENT AT [facility] Provider Note   CSN: [id number] Arrival date & time: 08/13/23  8657     History  Chief Complaint  Patient presents with   Abdominal Pain   Nausea    Neil Henderson is a 48 y.o. male.  HPI     48 year old male comes in with chief complaint of abdominal pain and nausea.  Patient has remote history of heavy alcohol use disorder, but he quit drinking 2 years ago.  He also has a history of hyperlipidemia.  No abdominal surgical history.  Patient comes in with chief complaint of abdominal pain that started yesterday.  Initially abdominal pain was generalized, but over time has moved to the right side.  Patient has associated nausea without vomiting.  He has no history of postprandial abdominal pain.  Pain is currently not radiating to the back.  Patient denies any illicit drug use history.  No history of pancreatitis.  Denies any urinary symptoms including no burning with urination or urinary frequency.  No history of kidney stones.  Home Medications Prior to Admission medications   Medication Sig Start Date End Date Taking? Authorizing Provider  acetaminophen (TYLENOL) 325 MG tablet Take 650 mg by mouth every 6 (six) hours as needed.    [provider]  aspirin EC 81 MG tablet Take 81 mg by mouth daily.    [provider]  atorvastatin (LIPITOR) 80 MG tablet Take  1 tablet  Daily  for Cholesterol 10/02/22   Raynelle Dick, NP  cephALEXin (KEFLEX) 500 MG capsule Take  1 capsule  3 x /day  with Meals for Infection 11/07/22   Lucky Cowboy, MD  cholecalciferol (VITAMIN D) 1000 units tablet Take 10,000 Units by mouth daily.    [provider]  tadalafil (CIALIS) 20 MG tablet Take 1/2 to 1 tablet every 2 to 3 days as needed for XXXX 02/11/23   Lucky Cowboy, MD  testosterone cypionate (DEPOTESTOSTERONE CYPIONATE) 200 MG/ML injection Inject  1 ml  into Muscle  each Week (7 days) 12/19/21   Lucky Cowboy, MD      Allergies    Patient has no known allergies.    Review of Systems   Review of Systems  All other systems reviewed and are negative.   Physical Exam Updated Vital Signs BP 136/77   Pulse 64   Temp 98.3 F (36.8 C) (Oral)   Resp 17   Ht 5\' 9"  (1.753 m)   Wt 96.3 kg   SpO2 96%   BMI 31.35 kg/m  Physical Exam Vitals and nursing note reviewed.  Constitutional:      Appearance: He is well-developed.  HENT:     Head: Atraumatic.  Cardiovascular:     Rate and Rhythm: Normal rate.  Pulmonary:     Effort: Pulmonary effort is normal.  Abdominal:     Tenderness: There is abdominal tenderness in the right upper quadrant and epigastric area. There is no guarding. Negative signs include Murphy's sign and Rovsing's sign.  Musculoskeletal:     Cervical back: Neck supple.  Skin:    General: Skin is warm.  Neurological:     Mental Status: He is alert and oriented to person, place, and time.     ED Results / Procedures / Treatments   Labs (all labs ordered are listed, but only abnormal results are displayed) Labs Reviewed  COMPREHENSIVE METABOLIC PANEL - Abnormal; Notable for the following components:  Result Value   Glucose, Bld 114 (*)    All other components within normal limits  CBC WITH DIFFERENTIAL/PLATELET - Abnormal; Notable for the following components:   WBC 11.2 (*)    Neutro Abs 8.8 (*)    All other components within normal limits  RESP PANEL BY RT-PCR (RSV, FLU A&B, COVID)  [id number]  LIPASE, BLOOD    EKG None  Radiology US Abdomen Limited RUQ (LIVER/GB) Result Date: 08/13/2023 CLINICAL DATA:  151470 RUQ abdominal pain 151470 EXAM: ULTRASOUND ABDOMEN LIMITED RIGHT UPPER QUADRANT COMPARISON:  None Available. FINDINGS: Gallbladder: Physiologically distended. There are multiple sessile gallbladder polyps with largest measuring up to 5 x 7 mm. No associated focal wall thickening. However, the gallbladder wall is diffusely thickened measuring up  to 3.5 mm. No pericholecystic free fluid. Sonographic Murphy's sign was positive as per the technologist. Common bile duct: Diameter: Less than 4 mm.  No intrahepatic bile duct dilation. Liver: No focal lesion identified. Within normal limits in parenchymal echogenicity. Portal vein is patent on color Doppler imaging with normal direction of blood flow towards the liver. Other: None. IMPRESSION: 1. There are multiple sessile gallbladder polyps, which can be characterized as low risk polyps. Largest polyp measured up to 5 x 7 mm. Follow-up ultrasound is recommended in 1 year to document stability. Please see gallbladder follow-up reference below. 2. Partially distended gallbladder and no pericholecystic free fluid; however, exhibiting mild diffuse wall thickening and technologist noted positive sonographic Murphy's sign. Findings are equivocal for acute cholecystitis but considered less likely. Correlate clinically to determine the need for additional imaging. 3. Otherwise unremarkable exam. Management of Incidentally Detected Gallbladder Polyps: Society of Radiologists in Ultrasound Consensus Conference Recommendations (published online December 28, 2020): [email or web address] Extremely low-risk polyps, i.e. pedunculated ball-on-the-wall or thin stalk: <9 mm: no follow-up 10-14 mm: follow-up at 6, 12 and 24 months >15 mm: surgical consult Low-risk polyps, i.e. pedunculated with a thick or wide stalk, or sessile: ?6 mm: no follow-up 7-9 mm follow-up ultrasound at 12 months 10-14 mm: follow-up ultrasound at 6, 12, 24, and 36 months vs surgical consult >15 mm: surgical consult Intermediate risk: focal wall-thickening >84mm adjacent to polyp: <6 mm: follow-up at 6, 12, 24, 36 months vs surgical consult >7 mm: surgical consult Electronically Signed   By: Jules Schick M.D.   On: 08/13/2023 11:12    Procedures Procedures    Medications Ordered in ED Medications  oxyCODONE-acetaminophen  (PERCOCET/ROXICET) 5-325 MG per tablet 1 tablet (1 tablet Oral Given 08/13/23 1306)  iohexol (OMNIPAQUE) 300 MG/ML solution 100 mL (100 mLs Intravenous Contrast Given 08/13/23 1531)  fentaNYL (SUBLIMAZE) injection 50 mcg (50 mcg Intravenous Given 08/13/23 1640)    ED Course/ Medical Decision Making/ A&P Clinical Course as of 08/13/23 1701  Mon Aug 13, 2023  1700 Surgery team has seen the patient.  CT results pending at this time.  They have recommended that medicine admit the patient.  They will continue to follow. NPO after midnight.  [AN]    Clinical Course User Index [AN] Derwood Kaplan, MD                                 Medical Decision Making Amount and/or Complexity of Data Reviewed Labs: ordered. Radiology: ordered.  Risk Prescription drug management.   This patient presents to the ED with chief complaint(s) of upper quadrant abdominal pain with pertinent past medical history of remote history of alcohol  use disorder.The complaint involves an extensive differential diagnosis and also carries with it a high risk of complications and morbidity.    The differential diagnosis includes : Pancreatitis, Hepatobiliary pathology including cholelithiasis and cholecystitis, Gastritis/peptic ulcer disease, small bowel obstruction, Acute coronary syndrome, Aortic Dissection   The initial plan is to get basic labs, right upper quadrant ultrasound.  Independent labs interpretation:  The following labs were independently interpreted: Patient CBC, CMP is reassuring.  Independent visualization and interpretation of imaging: - I independently visualized the following imaging with scope of interpretation limited to determining acute life threatening conditions related to emergency care: Ultrasound right upper quadrant, which revealed clear evidence of opacity in the ultrasound.  No evidence of stone however.  Ultrasound eventually read as multiple gallbladder polyps.  Patient does have  gallbladder wall thickening.  Consultation: - Consulted or discussed management/test interpretation with external professional: Discussed case with Elmer Sow.  With general surgery.  Discussed patient's presenting symptoms and the ultrasound findings.  He has staffed the case with Dr. Michaell Cowing and recommends that we get CT abdomen pelvis followed with p.o. challenge.  Results of the ED workup has been discussed with the patient. On repeat assessment, he states that his pain is increasing.  We will give him oral pain medicine right now.  Will keep him n.p.o. otherwise.  CT has been ordered.   Final Clinical Impression(s) / ED Diagnoses Final diagnoses:  Gall bladder polyp  Symptomatic cholelithiasis    Rx / DC Orders ED Discharge Orders     None         Derwood Kaplan, MD 08/13/23 1701

## 2023-08-13 NOTE — H&P (Signed)
History and Physical    Neil Henderson:[id number] DOB: [birth year] DOA: 08/13/2023  PCP: Oneita Hurt, No   Chief Complaint:  abd pain  HPI: Neil Henderson is a 48 y.o. male with medical history significant of hypertension, hyperlipidemia, obesity presents emergency department due to abdominal pain.  Patient has been having suprapubic abdominal tenderness for the last 24 hours.  Pain is moved from his suprapubic to right flank.  Due to severity of symptoms he presents emergency department where he is found to be afebrile hemodynamically stable.  Labs were obtained on presentation which demonstrated CMP unrevealing, WBC 11.2, hemoglobin 16.4, platelets 239, urinalysis and negative for infection.  Patient underwent ultrasound of abdomen which demonstrated gallbladder polyps without evidence of acute cholecystitis.  CT abdomen pelvis was obtained which showed infected cyst versus abscess of right kidney.  On evaluation patient was endorsing severe right-sided pain required IV morphine.  He was started on ceftriaxone IV fluids.  He denied any infection.  No burning when he pees.   Review of Systems: Review of Systems  Constitutional:  Negative for chills and fever.  HENT: Negative.    Eyes: Negative.   Respiratory: Negative.    Cardiovascular: Negative.   Gastrointestinal: Negative.   Genitourinary: Negative.   Musculoskeletal: Negative.   Skin: Negative.   Neurological: Negative.   Endo/Heme/Allergies: Negative.   Psychiatric/Behavioral: Negative.       As per HPI otherwise 10 point review of systems negative.   No Known Allergies  Past Medical History:  Diagnosis Date   Hyperlipidemia    Hypertension    Hypogonadism male    Obesity    Prediabetes    Vitamin D deficiency     Past Surgical History:  Procedure Laterality Date   CERVICAL FUSION       reports that he has quit smoking. His smoking use included cigarettes. He started smoking about 36 years ago. He has a 36.1 pack-year  smoking history. He has never used smokeless tobacco. He reports current alcohol use of about 4.0 standard drinks of alcohol per week. He reports that he does not currently use drugs after having used the following drugs: Marijuana. Frequency: 2.00 times per week.  Family History  Problem Relation Age of Onset   Hyperlipidemia Mother    Hyperlipidemia Father    Cancer Paternal Grandfather        Lung    Prior to Admission medications   Medication Sig Start Date End Date Taking? Authorizing Provider  acetaminophen (TYLENOL) 325 MG tablet Take 650 mg by mouth every 6 (six) hours as needed.    [provider]  aspirin EC 81 MG tablet Take 81 mg by mouth daily.    [provider]  atorvastatin (LIPITOR) 80 MG tablet Take  1 tablet  Daily  for Cholesterol 10/02/22   Raynelle Dick, NP  cephALEXin (KEFLEX) 500 MG capsule Take  1 capsule  3 x /day  with Meals for Infection 11/07/22   Lucky Cowboy, MD  cholecalciferol (VITAMIN D) 1000 units tablet Take 10,000 Units by mouth daily.    [provider]  tadalafil (CIALIS) 20 MG tablet Take 1/2 to 1 tablet every 2 to 3 days as needed for XXXX 02/11/23   Lucky Cowboy, MD  testosterone cypionate (DEPOTESTOSTERONE CYPIONATE) 200 MG/ML injection Inject  1 ml  into Muscle  each Week (7 days) 12/19/21   Lucky Cowboy, MD    Physical Exam: Vitals:   08/13/23 8119 08/13/23 1315 08/13/23 1620  08/13/23 2124  BP: (!) 151/86 (!) 150/82 136/77 (!) 152/85  Pulse: 85 63 64 70  Resp: 18 15 17 16   Temp: 97.9 F (36.6 C) 97.6 F (36.4 C) 98.3 F (36.8 C) 98.6 F (37 C)  TempSrc: Oral Oral Oral Oral  SpO2: 99% 98% 96% 96%  Weight:      Height:       Physical Exam Constitutional:      Appearance: He is normal weight.  HENT:     Head: Normocephalic.     Mouth/Throat:     Mouth: Mucous membranes are moist.  Cardiovascular:     Rate and Rhythm: Normal rate and regular rhythm.  Pulmonary:     Effort: Pulmonary effort is  normal.     Breath sounds: Normal breath sounds.  Abdominal:     General: Abdomen is flat. Bowel sounds are normal.     Palpations: Abdomen is soft.  Skin:    General: Skin is warm.     Capillary Refill: Capillary refill takes less than 2 seconds.  Neurological:     General: No focal deficit present.     Mental Status: He is alert.        Labs on Admission: I have personally reviewed the patients's labs and imaging studies.  Assessment/Plan Active Problems:   Essential hypertension   Hyperlipidemia   Pyelonephritis    # Right infected kidney cyst - Patient has leukocytosis - Right-sided abdominal paim  Plan: N.p.o. at midnight IV fluids Start ceftriaxone Will likely need nephrology consult regarding effective kidney cyst  # Gallbladder polyps - Evaluated by surgery emergency department patient may need cholecystectomy in the future.  Do not think gallbladder source of patient's symptoms as LFTs are normal and no evidence of acute cholecystitis  # Hyperlipidemia-continue Lipitor   Admission status: Observation Med-Surg  Certification: The appropriate patient status for this patient is OBSERVATION. Observation status is judged to be reasonable and necessary in order to provide the required intensity of service to ensure the patient's safety. The patient's presenting symptoms, physical exam findings, and initial radiographic and laboratory data in the context of their medical condition is felt to place them at decreased risk for further clinical deterioration. Furthermore, it is anticipated that the patient will be medically stable for discharge from the hospital within 2 midnights of admission.     Alan Mulder MD [facility] If 7PM-7AM, please contact night-coverage [email or web address]  08/13/2023, 10:30 PM   \

## 2023-08-13 NOTE — ED Provider Notes (Signed)
  Physical Exam  BP 136/77   Pulse 64   Temp 98.3 F (36.8 C) (Oral)   Resp 17   Ht 5\' 9"  (1.753 m)   Wt 96.3 kg   SpO2 96%   BMI 31.35 kg/m   Physical Exam  Procedures  Procedures  ED Course / MDM   Clinical Course as of 08/13/23 1901  Mon Aug 13, 2023  1700 Surgery team has seen the patient.  CT results pending at this time.  They have recommended that medicine admit the patient.  They will continue to follow. NPO after midnight.  [AN]    Clinical Course User Index [AN] Derwood Kaplan, MD   Medical Decision Making Amount and/or Complexity of Data Reviewed Labs: ordered. Radiology: ordered.  Risk Prescription drug management. Decision regarding hospitalization.   Received in signout.  Abdominal pain.  Indeterminant ultrasound for biliary disease.  CT scan done and showed no biliary issues, although may have infected cyst on kidney.  States over the last couple weeks urine has felt different but not dysuria.  Does not have CVA tenderness.  Will get urinalysis.  Initial plan prior CT was admission to medicine with general surgery following potential laparoscopic done tomorrow.  Urinalysis does not show infection.  Will discuss with hospitalist for admission.       Benjiman Core, MD 08/13/23 [phone]

## 2023-08-14 ENCOUNTER — Observation Stay (HOSPITAL_COMMUNITY): Payer: BC Managed Care – PPO

## 2023-08-14 ENCOUNTER — Encounter (HOSPITAL_COMMUNITY)
Admission: EM | Disposition: A | Discharge status: Home / Self Care | Payer: Self-pay | Source: Home / Self Care | Attending: Internal Medicine

## 2023-08-14 DIAGNOSIS — Z1152 Encounter for screening for COVID-19: Secondary | ICD-10-CM | POA: Diagnosis not present

## 2023-08-14 DIAGNOSIS — N281 Cyst of kidney, acquired: Secondary | ICD-10-CM | POA: Diagnosis not present

## 2023-08-14 DIAGNOSIS — N151 Renal and perinephric abscess: Secondary | ICD-10-CM | POA: Diagnosis present

## 2023-08-14 DIAGNOSIS — N2889 Other specified disorders of kidney and ureter: Secondary | ICD-10-CM | POA: Diagnosis not present

## 2023-08-14 DIAGNOSIS — Z7982 Long term (current) use of aspirin: Secondary | ICD-10-CM | POA: Diagnosis not present

## 2023-08-14 DIAGNOSIS — E290 Testicular hyperfunction: Secondary | ICD-10-CM | POA: Diagnosis present

## 2023-08-14 DIAGNOSIS — K8012 Calculus of gallbladder with acute and chronic cholecystitis without obstruction: Secondary | ICD-10-CM | POA: Diagnosis present

## 2023-08-14 DIAGNOSIS — E785 Hyperlipidemia, unspecified: Secondary | ICD-10-CM | POA: Diagnosis present

## 2023-08-14 DIAGNOSIS — N12 Tubulo-interstitial nephritis, not specified as acute or chronic: Secondary | ICD-10-CM | POA: Diagnosis present

## 2023-08-14 DIAGNOSIS — K812 Acute cholecystitis with chronic cholecystitis: Secondary | ICD-10-CM | POA: Diagnosis not present

## 2023-08-14 DIAGNOSIS — K8020 Calculus of gallbladder without cholecystitis without obstruction: Secondary | ICD-10-CM

## 2023-08-14 DIAGNOSIS — F1010 Alcohol abuse, uncomplicated: Secondary | ICD-10-CM | POA: Diagnosis present

## 2023-08-14 DIAGNOSIS — Z79899 Other long term (current) drug therapy: Secondary | ICD-10-CM | POA: Diagnosis not present

## 2023-08-14 DIAGNOSIS — E669 Obesity, unspecified: Secondary | ICD-10-CM | POA: Diagnosis present

## 2023-08-14 DIAGNOSIS — Z6831 Body mass index (BMI) 31.0-31.9, adult: Secondary | ICD-10-CM | POA: Diagnosis not present

## 2023-08-14 DIAGNOSIS — R109 Unspecified abdominal pain: Secondary | ICD-10-CM | POA: Diagnosis not present

## 2023-08-14 DIAGNOSIS — I10 Essential (primary) hypertension: Secondary | ICD-10-CM | POA: Diagnosis present

## 2023-08-14 DIAGNOSIS — K824 Cholesterolosis of gallbladder: Secondary | ICD-10-CM | POA: Diagnosis not present

## 2023-08-14 DIAGNOSIS — F17210 Nicotine dependence, cigarettes, uncomplicated: Secondary | ICD-10-CM | POA: Diagnosis present

## 2023-08-14 DIAGNOSIS — Z83438 Family history of other disorder of lipoprotein metabolism and other lipidemia: Secondary | ICD-10-CM | POA: Diagnosis not present

## 2023-08-14 DIAGNOSIS — Z801 Family history of malignant neoplasm of trachea, bronchus and lung: Secondary | ICD-10-CM | POA: Diagnosis not present

## 2023-08-14 DIAGNOSIS — K819 Cholecystitis, unspecified: Secondary | ICD-10-CM | POA: Diagnosis present

## 2023-08-14 DIAGNOSIS — Z981 Arthrodesis status: Secondary | ICD-10-CM | POA: Diagnosis not present

## 2023-08-14 DIAGNOSIS — R7303 Prediabetes: Secondary | ICD-10-CM | POA: Diagnosis present

## 2023-08-14 LAB — COMPREHENSIVE METABOLIC PANEL
ALT: 14 U/L (ref 0–44)
AST: 13 U/L — ABNORMAL LOW (ref 15–41)
Albumin: 3.6 g/dL (ref 3.5–5.0)
Alkaline Phosphatase: 74 U/L (ref 38–126)
Anion gap: 10 (ref 5–15)
BUN: 13 mg/dL (ref 6–20)
CO2: 24 mmol/L (ref 22–32)
Calcium: 9 mg/dL (ref 8.9–10.3)
Chloride: 100 mmol/L (ref 98–111)
Creatinine, Ser: 1.01 mg/dL (ref 0.61–1.24)
GFR, Estimated: >60 mL/min (ref >60)
Glucose, Bld: 87 mg/dL (ref 70–99)
Potassium: 3.9 mmol/L (ref 3.5–5.1)
Sodium: 134 mmol/L — ABNORMAL LOW (ref 135–145)
Total Bilirubin: 1.5 mg/dL — ABNORMAL HIGH (ref 0.0–1.2)
Total Protein: 6.7 g/dL (ref 6.5–8.1)

## 2023-08-14 LAB — CBC
HCT: 45.2 % (ref 39.0–52.0)
Hemoglobin: 14.9 g/dL (ref 13.0–17.0)
MCH: 30.4 pg (ref 26.0–34.0)
MCHC: 33 g/dL (ref 30.0–36.0)
MCV: 92.2 fL (ref 80.0–100.0)
Platelets: 220 10*3/uL (ref 150–400)
RBC: 4.9 MIL/uL (ref 4.22–5.81)
RDW: 12.1 % (ref 11.5–15.5)
WBC: 10.5 10*3/uL (ref 4.0–10.5)
nRBC: 0 % (ref 0.0–0.2)

## 2023-08-14 LAB — SURGICAL PCR SCREEN
MRSA, PCR: NEGATIVE
Staphylococcus aureus: NEGATIVE

## 2023-08-14 LAB — PROTIME-INR
INR: 1.1 (ref 0.8–1.2)
Prothrombin Time: 14.5 s (ref 11.4–15.2)

## 2023-08-14 SURGERY — LAPAROSCOPIC CHOLECYSTECTOMY SINGLE SITE WITH INTRAOPERATIVE CHOLANGIOGRAM
Anesthesia: General

## 2023-08-14 MED ORDER — POLYETHYLENE GLYCOL 3350 17 G PO PACK
17.0000 g | PACK | Freq: Two times a day (BID) | ORAL | Status: AC
  Administered 2023-08-15: 17 g via ORAL
  Filled 2023-08-14 (×4): qty 1

## 2023-08-14 MED ORDER — HYDROMORPHONE HCL 1 MG/ML IJ SOLN: 1.0000 mg | INTRAMUSCULAR | Status: DC | PRN

## 2023-08-14 MED ORDER — GADOBUTROL 1 MMOL/ML IV SOLN
10.0000 mL | Freq: Once | INTRAVENOUS | Status: AC | PRN
  Administered 2023-08-14: 10 mL via INTRAVENOUS

## 2023-08-14 MED ORDER — OXYCODONE-ACETAMINOPHEN 5-325 MG PO TABS
1.0000 | ORAL_TABLET | ORAL | Status: DC | PRN
  Administered 2023-08-14 – 2023-08-15 (×8): 1 via ORAL
  Filled 2023-08-14 (×9): qty 1

## 2023-08-14 MED ORDER — NICOTINE 7 MG/24HR TD PT24
7.0000 mg | MEDICATED_PATCH | Freq: Once | TRANSDERMAL | Status: AC
  Administered 2023-08-14: 7 mg via TRANSDERMAL
  Filled 2023-08-14: qty 1

## 2023-08-14 NOTE — Progress Notes (Signed)
Subjective: CC: Pain has now localized to his RUQ. Reports nausea after clears. No worsening abdominal pain. No vomiting. Last BM yesterday.   Objective: Vital signs in last 24 hours: Temp:  [97.6 F (36.4 C)-98.6 F (37 C)] 98 F (36.7 C) (02/18 0929) Pulse Rate:  [62-72] 62 (02/18 0929) Resp:  [15-18] 18 (02/18 0929) BP: (123-152)/(76-85) 123/77 (02/18 0929) SpO2:  [96 %-98 %] 96 % (02/18 0929) Last BM Date : 08/13/23  Intake/Output from previous day: 02/17 0701 - 02/18 0700 In: 566.2 [I.V.:466.2; IV Piggyback:100] Out: -  Intake/Output this shift: No intake/output data recorded.  PE: Gen:  Alert, NAD, pleasant Abd: Soft, ND, RUQ ttp - otherwise NT. +BS  Lab Results:  Recent Labs    08/13/23 0903 08/14/23 0451  WBC 11.2* 10.5  HGB 16.4 14.9  HCT 48.0 45.2  PLT 239 220   BMET Recent Labs    08/13/23 0903 08/14/23 0451  NA 136 134*  K 4.2 3.9  CL 102 100  CO2 25 24  GLUCOSE 114* 87  BUN 12 13  CREATININE 0.85 1.01  CALCIUM 9.7 9.0   PT/INR No results for input(s): "LABPROT", "INR" in the last 72 hours. CMP     Component Value Date/Time   NA 134 (L) 08/14/2023 0451   K 3.9 08/14/2023 0451   CL 100 08/14/2023 0451   CO2 24 08/14/2023 0451   GLUCOSE 87 08/14/2023 0451   BUN 13 08/14/2023 0451   CREATININE 1.01 08/14/2023 0451   CREATININE 1.08 04/02/2023 1456   CALCIUM 9.0 08/14/2023 0451   PROT 6.7 08/14/2023 0451   ALBUMIN 3.6 08/14/2023 0451   AST 13 (L) 08/14/2023 0451   ALT 14 08/14/2023 0451   ALKPHOS 74 08/14/2023 0451   BILITOT 1.5 (H) 08/14/2023 0451   GFRNONAA >60 08/14/2023 0451   GFRNONAA 102 12/20/2020 1102   GFRAA 118 12/20/2020 1102   Lipase     Component Value Date/Time   LIPASE 43 08/13/2023 0903    Studies/Results: CT ABDOMEN PELVIS W CONTRAST Result Date: 08/13/2023 CLINICAL DATA:  Right-sided abdominal pain. EXAM: CT ABDOMEN AND PELVIS WITH CONTRAST TECHNIQUE: Multidetector CT imaging of the abdomen and  pelvis was performed using the standard protocol following bolus administration of intravenous contrast. RADIATION DOSE REDUCTION: This exam was performed according to the departmental dose-optimization program which includes automated exposure control, adjustment of the mA and/or kV according to patient size and/or use of iterative reconstruction technique. CONTRAST:  OMNIPAQUE IOHEXOL 300 MG/ML  SOLN COMPARISON:  Ultrasound dated 08/13/2023. FINDINGS: Lower chest: The visualized lung bases are clear. No intra-abdominal free air or free fluid. Hepatobiliary: Fatty liver. No biliary dilatation. No calcified gallstones. Tiny nodular densities in the gallbladder better evaluated on the ultrasound and may represent polyps. No pericholecystic fluid. Pancreas: Unremarkable. No pancreatic ductal dilatation or surrounding inflammatory changes. Spleen: Normal in size without focal abnormality. Adrenals/Urinary Tract: The adrenal glands are unremarkable. There is a 5.7 x 3.3 cm bilobed and partially exophytic cyst arising from the upper pole of the right kidney. There is areas of higher attenuation within this cyst which may represent hemorrhage, proteinaceous debris, or less likely enhancing tissue. There is stranding and edema of the perinephric fat adjacent to the cyst. This may represent an infected cyst or an abscess. Clinical correlation and follow-up to resolution recommended to exclude malignancy. The left kidney is unremarkable. The visualized ureters and urinary bladder appear unremarkable. Stomach/Bowel: There is no bowel obstruction  or active inflammation. The appendix is normal. Vascular/Lymphatic: Mild aortoiliac atherosclerotic disease. The IVC is unremarkable. No portal venous gas. There is no adenopathy. Reproductive: The prostate and seminal vesicles are grossly unremarkable. No pelvic mass. Other: None Musculoskeletal: Right L5 pars defect. No listhesis. Mild degenerative changes of spine. No acute  osseous pathology. IMPRESSION: 1. Partially exophytic right renal upper pole infected cyst versus abscess. Follow-up to resolution recommended to exclude malignancy. 2. No bowel obstruction. Normal appendix. 3. Fatty liver. 4.  Aortic Atherosclerosis (ICD10-I70.0). Electronically Signed   By: Elgie Collard M.D.   On: 08/13/2023 18:05   US Abdomen Limited RUQ (LIVER/GB) Result Date: 08/13/2023 CLINICAL DATA:  151470 RUQ abdominal pain [id number] EXAM: ULTRASOUND ABDOMEN LIMITED RIGHT UPPER QUADRANT COMPARISON:  None Available. FINDINGS: Gallbladder: Physiologically distended. There are multiple sessile gallbladder polyps with largest measuring up to 5 x 7 mm. No associated focal wall thickening. However, the gallbladder wall is diffusely thickened measuring up to 3.5 mm. No pericholecystic free fluid. Sonographic Murphy's sign was positive as per the technologist. Common bile duct: Diameter: Less than 4 mm.  No intrahepatic bile duct dilation. Liver: No focal lesion identified. Within normal limits in parenchymal echogenicity. Portal vein is patent on color Doppler imaging with normal direction of blood flow towards the liver. Other: None. IMPRESSION: 1. There are multiple sessile gallbladder polyps, which can be characterized as low risk polyps. Largest polyp measured up to 5 x 7 mm. Follow-up ultrasound is recommended in 1 year to document stability. Please see gallbladder follow-up reference below. 2. Partially distended gallbladder and no pericholecystic free fluid; however, exhibiting mild diffuse wall thickening and technologist noted positive sonographic Murphy's sign. Findings are equivocal for acute cholecystitis but considered less likely. Correlate clinically to determine the need for additional imaging. 3. Otherwise unremarkable exam. Management of Incidentally Detected Gallbladder Polyps: Society of Radiologists in Ultrasound Consensus Conference Recommendations (published online December 28, 2020):  [email or web address] Extremely low-risk polyps, i.e. pedunculated ball-on-the-wall or thin stalk: <9 mm: no follow-up 10-14 mm: follow-up at 6, 12 and 24 months >15 mm: surgical consult Low-risk polyps, i.e. pedunculated with a thick or wide stalk, or sessile: ?6 mm: no follow-up 7-9 mm follow-up ultrasound at 12 months 10-14 mm: follow-up ultrasound at 6, 12, 24, and 36 months vs surgical consult >15 mm: surgical consult Intermediate risk: focal wall-thickening >88mm adjacent to polyp: <6 mm: follow-up at 6, 12, 24, 36 months vs surgical consult >7 mm: surgical consult Electronically Signed   By: Jules Schick M.D.   On: 08/13/2023 11:12    Anti-infectives: Anti-infectives (From admission, onward)    Start     Dose/Rate Route Frequency Ordered Stop   08/13/23 2315  cefTRIAXone (ROCEPHIN) 2 g in sodium chloride 0.9 % 100 mL IVPB        2 g 200 mL/hr over 30 Minutes Intravenous Every 24 hours 08/13/23 2228 08/20/23 2314        Assessment/Plan R sided abdominal pain  Gallbladder polyps, possible cholecystitis - [place] with mild diffuse wall thickening and multiple sessile polyps, positive sonographic murphy sign.  - CT w/ Partially exophytic right renal upper pole cyst/mass. UA negative. Urology has seen and is getting IR to collect an aspiration as well as core biopsy for culture and Gram stain  - He is now having focal ttp in the RUQ and worsening nausea after PO intake. Afebrile. WBC wnl. T. Bili 1.5 - LFT's otherwise non-elevated. Will discuss w/ MD if we proceed w/ Lap Chole during  admission for above findings vs await further w/u by Urology for his right renal pole cyst/mass and plan lap chole as an outpatient if he was to improve.  - He is on Rocephin which would cover for Cholecystitis  - We will follow with you  FEN - NPO for IR eval. Okay for liquids after IR procedure or if no procedure planned for today. IVF per [facility] VTE - SCDs, okay for chem ppx from our standpoint ID  - Rocephin  I reviewed nursing notes, last 24 h vitals and pain scores, last 48 h intake and output, last 24 h labs and trends, and last 24 h imaging results.   LOS: 1 day    Jacinto Halim, [facility] Surgery 08/14/2023, 10:53 AM Please see Amion for pager number during day hours 7:00am-4:30pm

## 2023-08-14 NOTE — Consult Note (Cosign Needed Addendum)
Urology Consult Note   Requesting Attending Physician:  Marinda Elk, MD Service Providing Consult: Urology  Consulting Attending: Dr. Berneice Heinrich   Reason for Consult:  renal mass  HPI: Neil Henderson is seen in consultation for reasons noted above at the request of General Surgery Team. Patient initially presents to [facility] emergency department with complaints of abdominal pain x 2 days, transitioning to the right flank.  Urinalysis was negative for infection, CT noted gallbladder polyps, and right renal mass.  Patient was being prepped for cholecystectomy.  This was held while awaiting urology's assessment of right renal mass.  On my arrival patient was sleeping but easily awoken.  He was alert, oriented, and in no distress.  He reports that this pain has been present for approximately 2 days but was significant enough to wake him up.  He localizes the pain to his umbilical and epigastric region, eventually radiating around to his right lower back.  He does not experience any right CVA tenderness but is tender in the area of the right iliac crest and lumbar spine.  He denies any precipitating events.  He stated that he did have a fall about 2 weeks ago but did not strike that area of his flank.  ------------------  Assessment:  48 y.o. male with renal mass and ABD pain   Recommendations: #renal mass Difficult to say what the cyst is comprised of.  It could be infectious, though his urine is completely clear and it would be odd without a UTI +/- obstruction.  There was no precipitating trauma, though there may be some blood products contained. There is also possibility of malignancy mentioned, but not convincingly.  We have asked interventional radiology to collect a aspiration as well as core biopsy for culture and Gram stain.  Urology referral will follow along peripherally while awaiting culture/path  Addendum: Renal Mass protocol pending prior to biopsy.   Case and plan  discussed with Dr. Berneice Heinrich, Gen Surg team  Past Medical History: Past Medical History:  Diagnosis Date   Hyperlipidemia    Hypertension    Hypogonadism male    Obesity    Prediabetes    Vitamin D deficiency     Past Surgical History:  Past Surgical History:  Procedure Laterality Date   CERVICAL FUSION      Medication: Current Facility-Administered Medications  Medication Dose Route Frequency Provider Last Rate Last Admin   acetaminophen (TYLENOL) tablet 650 mg  650 mg Oral Q6H PRN Alan Mulder, MD       Or   acetaminophen (TYLENOL) suppository 650 mg  650 mg Rectal Q6H PRN Dorrell, Robert, MD       atorvastatin (LIPITOR) tablet 80 mg  80 mg Oral Daily Dorrell, Molly Maduro, MD   80 mg at 08/14/23 0845   cefTRIAXone (ROCEPHIN) 2 g in sodium chloride 0.9 % 100 mL IVPB  2 g Intravenous Q24H Alan Mulder, MD 200 mL/hr at 08/13/23 2252 2 g at 08/13/23 2252   HYDROmorphone (DILAUDID) injection 1 mg  1 mg Intravenous Q2H PRN Marinda Elk, MD       lactated ringers infusion   Intravenous Continuous Alan Mulder, MD 125 mL/hr at 08/14/23 0730 New Bag at 08/14/23 0730   ondansetron (ZOFRAN) tablet 4 mg  4 mg Oral Q6H PRN Alan Mulder, MD       Or   ondansetron (ZOFRAN) injection 4 mg  4 mg Intravenous Q6H PRN Alan Mulder, MD       oxyCODONE-acetaminophen (  PERCOCET/ROXICET) 5-325 MG per tablet 1 tablet  1 tablet Oral Q4H PRN Marinda Elk, MD   1 tablet at 08/14/23 0865   polyethylene glycol (MIRALAX / GLYCOLAX) packet 17 g  17 g Oral BID Marinda Elk, MD        Allergies: No Known Allergies  Social History: Social History   Tobacco Use   Smoking status: Former    Current packs/day: 1.00    Average packs/day: 1 pack/day for 36.1 years (36.1 ttl pk-yrs)    Types: Cigarettes    Start date: 1989   Smokeless tobacco: Never  Substance Use Topics   Alcohol use: Yes    Alcohol/week: 4.0 standard drinks of alcohol    Types: 4 Standard drinks or  equivalent per week    Comment: moderate   Drug use: Not Currently    Frequency: 2.0 times per week    Types: Marijuana    Family History Family History  Problem Relation Age of Onset   Hyperlipidemia Mother    Hyperlipidemia Father    Cancer Paternal Grandfather        Lung    Review of Systems  Gastrointestinal:  Positive for abdominal pain.  Genitourinary:  Positive for dysuria and flank pain. Negative for frequency, hematuria and urgency.     Objective   Vital signs in last 24 hours: BP 123/77 (BP Location: Left Arm)   Pulse 62   Temp 98 F (36.7 C) (Oral)   Resp 18   Ht 5\' 9"  (1.753 m)   Wt 96.3 kg   SpO2 96%   BMI 31.35 kg/m   Physical Exam General: A&O, resting, appropriate HEENT: [place]/AT Pulmonary: Normal work of breathing Cardiovascular: no cyanosis Abdomen: Soft, tender at RUQ on palpation, not on rebound, nondistended   Most Recent Labs: Lab Results  Component Value Date   WBC 10.5 08/14/2023   HGB 14.9 08/14/2023   HCT 45.2 08/14/2023   PLT 220 08/14/2023    Lab Results  Component Value Date   NA 134 (L) 08/14/2023   K 3.9 08/14/2023   CL 100 08/14/2023   CO2 24 08/14/2023   BUN 13 08/14/2023   CREATININE 1.01 08/14/2023   CALCIUM 9.0 08/14/2023   MG 2.0 04/02/2023    No results found for: "INR", "APTT"   Urine Culture: @[id number] (laburin,org,[id number])@   IMAGING: CT ABDOMEN PELVIS W CONTRAST Result Date: 08/13/2023 CLINICAL DATA:  Right-sided abdominal pain. EXAM: CT ABDOMEN AND PELVIS WITH CONTRAST TECHNIQUE: Multidetector CT imaging of the abdomen and pelvis was performed using the standard protocol following bolus administration of intravenous contrast. RADIATION DOSE REDUCTION: This exam was performed according to the departmental dose-optimization program which includes automated exposure control, adjustment of the mA and/or kV according to patient size and/or use of iterative reconstruction technique. CONTRAST:   OMNIPAQUE IOHEXOL 300 MG/ML  SOLN COMPARISON:  Ultrasound dated 08/13/2023. FINDINGS: Lower chest: The visualized lung bases are clear. No intra-abdominal free air or free fluid. Hepatobiliary: Fatty liver. No biliary dilatation. No calcified gallstones. Tiny nodular densities in the gallbladder better evaluated on the ultrasound and may represent polyps. No pericholecystic fluid. Pancreas: Unremarkable. No pancreatic ductal dilatation or surrounding inflammatory changes. Spleen: Normal in size without focal abnormality. Adrenals/Urinary Tract: The adrenal glands are unremarkable. There is a 5.7 x 3.3 cm bilobed and partially exophytic cyst arising from the upper pole of the right kidney. There is areas of higher attenuation within this cyst which may represent hemorrhage, proteinaceous debris, or less  likely enhancing tissue. There is stranding and edema of the perinephric fat adjacent to the cyst. This may represent an infected cyst or an abscess. Clinical correlation and follow-up to resolution recommended to exclude malignancy. The left kidney is unremarkable. The visualized ureters and urinary bladder appear unremarkable. Stomach/Bowel: There is no bowel obstruction or active inflammation. The appendix is normal. Vascular/Lymphatic: Mild aortoiliac atherosclerotic disease. The IVC is unremarkable. No portal venous gas. There is no adenopathy. Reproductive: The prostate and seminal vesicles are grossly unremarkable. No pelvic mass. Other: None Musculoskeletal: Right L5 pars defect. No listhesis. Mild degenerative changes of spine. No acute osseous pathology. IMPRESSION: 1. Partially exophytic right renal upper pole infected cyst versus abscess. Follow-up to resolution recommended to exclude malignancy. 2. No bowel obstruction. Normal appendix. 3. Fatty liver. 4.  Aortic Atherosclerosis (ICD10-I70.0). Electronically Signed   By: Elgie Collard M.D.   On: 08/13/2023 18:05   US Abdomen Limited RUQ  (LIVER/GB) Result Date: 08/13/2023 CLINICAL DATA:  151470 RUQ abdominal pain [id number] EXAM: ULTRASOUND ABDOMEN LIMITED RIGHT UPPER QUADRANT COMPARISON:  None Available. FINDINGS: Gallbladder: Physiologically distended. There are multiple sessile gallbladder polyps with largest measuring up to 5 x 7 mm. No associated focal wall thickening. However, the gallbladder wall is diffusely thickened measuring up to 3.5 mm. No pericholecystic free fluid. Sonographic Murphy's sign was positive as per the technologist. Common bile duct: Diameter: Less than 4 mm.  No intrahepatic bile duct dilation. Liver: No focal lesion identified. Within normal limits in parenchymal echogenicity. Portal vein is patent on color Doppler imaging with normal direction of blood flow towards the liver. Other: None. IMPRESSION: 1. There are multiple sessile gallbladder polyps, which can be characterized as low risk polyps. Largest polyp measured up to 5 x 7 mm. Follow-up ultrasound is recommended in 1 year to document stability. Please see gallbladder follow-up reference below. 2. Partially distended gallbladder and no pericholecystic free fluid; however, exhibiting mild diffuse wall thickening and technologist noted positive sonographic Murphy's sign. Findings are equivocal for acute cholecystitis but considered less likely. Correlate clinically to determine the need for additional imaging. 3. Otherwise unremarkable exam. Management of Incidentally Detected Gallbladder Polyps: Society of Radiologists in Ultrasound Consensus Conference Recommendations (published online December 28, 2020): [email or web address] Extremely low-risk polyps, i.e. pedunculated ball-on-the-wall or thin stalk: <9 mm: no follow-up 10-14 mm: follow-up at 6, 12 and 24 months >15 mm: surgical consult Low-risk polyps, i.e. pedunculated with a thick or wide stalk, or sessile: ?6 mm: no follow-up 7-9 mm follow-up ultrasound at 12 months 10-14 mm: follow-up ultrasound at  6, 12, 24, and 36 months vs surgical consult >15 mm: surgical consult Intermediate risk: focal wall-thickening >12mm adjacent to polyp: <6 mm: follow-up at 6, 12, 24, 36 months vs surgical consult >7 mm: surgical consult Electronically Signed   By: Jules Schick M.D.   On: 08/13/2023 11:12    ------  Elmon Kirschner, NP Pager: [phone]   Please contact the urology consult pager with any further questions/concerns.   I have seen and examined the patient and agree with Lyda Colcord's plan.  Briefly.  S: Equivocal Rt renal cystic mass with stranding. DDX abscess v. Cyst . Malignancy. Incidental on CT for some likely cholecystitis  O:  NAD RUQ pain, no CVAT  CT ? Abscess v. Cyst v. Neoplsm. UA negatve  A/P: IR aspiration + BX v. MRI, they prefer MRI, ordered.

## 2023-08-14 NOTE — Progress Notes (Addendum)
[facility] PROGRESS NOTE    Progress Note  Neil Henderson  AVW:[id number] DOB: 28-Aug-1975 DOA: 08/13/2023 PCP: Oneita Hurt, No     Brief Narrative:   Neil Henderson is an 48 y.o. male past medical history significant for essential hypertension comes in to the ED for abdominal pain, mainly suprapubic abdominal tenderness that moved to his right flank that started 24 hours prior to admission in the ED was found to have a white count of 11 hemoglobin of 16 UA was negative for infection abdominal ultrasound was unremarkable CT scan of the abdomen and pelvis showed an infected cyst versus right renal abscess.   Assessment/Plan:   Right renal cyst infection: Was placed n.p.o. started empirically on IV Rocephin. Urology was consulted who is going to discuss with IR to proceed with intervention.  Drain aspirate or biopsy UA does not appear to be infected.  Specific area 1046 and urinalysis. Continue IV fluids  Gallbladder wall polyp: Currently asymptomatic no signs of cholecystitis General Surgery was consulted will may need cholecystectomy in the future but not at this point in time.  Essential hypertension Blood pressure stable with no antihypertensive medication.  Hyperlipidemia Continue statins.   DVT prophylaxis: lovenox Family Communication:none Status is: Observation The patient remains OBS appropriate and will d/c before 2 midnights.    Code Status:     Code Status Orders  (From admission, onward)           Start     Ordered   08/13/23 2228  Full code  Continuous       Question:  By:  Answer:  Consent: discussion documented in EHR   08/13/23 2228           Code Status History     This patient has a current code status but no historical code status.         IV Access:   Peripheral IV   Procedures and diagnostic studies:   CT ABDOMEN PELVIS W CONTRAST Result Date: 08/13/2023 CLINICAL DATA:  Right-sided abdominal pain. EXAM: CT ABDOMEN AND PELVIS  WITH CONTRAST TECHNIQUE: Multidetector CT imaging of the abdomen and pelvis was performed using the standard protocol following bolus administration of intravenous contrast. RADIATION DOSE REDUCTION: This exam was performed according to the departmental dose-optimization program which includes automated exposure control, adjustment of the mA and/or kV according to patient size and/or use of iterative reconstruction technique. CONTRAST:  OMNIPAQUE IOHEXOL 300 MG/ML  SOLN COMPARISON:  Ultrasound dated 08/13/2023. FINDINGS: Lower chest: The visualized lung bases are clear. No intra-abdominal free air or free fluid. Hepatobiliary: Fatty liver. No biliary dilatation. No calcified gallstones. Tiny nodular densities in the gallbladder better evaluated on the ultrasound and may represent polyps. No pericholecystic fluid. Pancreas: Unremarkable. No pancreatic ductal dilatation or surrounding inflammatory changes. Spleen: Normal in size without focal abnormality. Adrenals/Urinary Tract: The adrenal glands are unremarkable. There is a 5.7 x 3.3 cm bilobed and partially exophytic cyst arising from the upper pole of the right kidney. There is areas of higher attenuation within this cyst which may represent hemorrhage, proteinaceous debris, or less likely enhancing tissue. There is stranding and edema of the perinephric fat adjacent to the cyst. This may represent an infected cyst or an abscess. Clinical correlation and follow-up to resolution recommended to exclude malignancy. The left kidney is unremarkable. The visualized ureters and urinary bladder appear unremarkable. Stomach/Bowel: There is no bowel obstruction or active inflammation. The appendix is normal. Vascular/Lymphatic: Mild aortoiliac atherosclerotic disease. The  IVC is unremarkable. No portal venous gas. There is no adenopathy. Reproductive: The prostate and seminal vesicles are grossly unremarkable. No pelvic mass. Other: None Musculoskeletal: Right L5 pars  defect. No listhesis. Mild degenerative changes of spine. No acute osseous pathology. IMPRESSION: 1. Partially exophytic right renal upper pole infected cyst versus abscess. Follow-up to resolution recommended to exclude malignancy. 2. No bowel obstruction. Normal appendix. 3. Fatty liver. 4.  Aortic Atherosclerosis (ICD10-I70.0). Electronically Signed   By: Elgie Collard M.D.   On: 08/13/2023 18:05   US Abdomen Limited RUQ (LIVER/GB) Result Date: 08/13/2023 CLINICAL DATA:  151470 RUQ abdominal pain [id number] EXAM: ULTRASOUND ABDOMEN LIMITED RIGHT UPPER QUADRANT COMPARISON:  None Available. FINDINGS: Gallbladder: Physiologically distended. There are multiple sessile gallbladder polyps with largest measuring up to 5 x 7 mm. No associated focal wall thickening. However, the gallbladder wall is diffusely thickened measuring up to 3.5 mm. No pericholecystic free fluid. Sonographic Murphy's sign was positive as per the technologist. Common bile duct: Diameter: Less than 4 mm.  No intrahepatic bile duct dilation. Liver: No focal lesion identified. Within normal limits in parenchymal echogenicity. Portal vein is patent on color Doppler imaging with normal direction of blood flow towards the liver. Other: None. IMPRESSION: 1. There are multiple sessile gallbladder polyps, which can be characterized as low risk polyps. Largest polyp measured up to 5 x 7 mm. Follow-up ultrasound is recommended in 1 year to document stability. Please see gallbladder follow-up reference below. 2. Partially distended gallbladder and no pericholecystic free fluid; however, exhibiting mild diffuse wall thickening and technologist noted positive sonographic Murphy's sign. Findings are equivocal for acute cholecystitis but considered less likely. Correlate clinically to determine the need for additional imaging. 3. Otherwise unremarkable exam. Management of Incidentally Detected Gallbladder Polyps: Society of Radiologists in Ultrasound Consensus  Conference Recommendations (published online December 28, 2020): [email or web address] Extremely low-risk polyps, i.e. pedunculated ball-on-the-wall or thin stalk: <9 mm: no follow-up 10-14 mm: follow-up at 6, 12 and 24 months >15 mm: surgical consult Low-risk polyps, i.e. pedunculated with a thick or wide stalk, or sessile: ?6 mm: no follow-up 7-9 mm follow-up ultrasound at 12 months 10-14 mm: follow-up ultrasound at 6, 12, 24, and 36 months vs surgical consult >15 mm: surgical consult Intermediate risk: focal wall-thickening >27mm adjacent to polyp: <6 mm: follow-up at 6, 12, 24, 36 months vs surgical consult >7 mm: surgical consult Electronically Signed   By: Jules Schick M.D.   On: 08/13/2023 11:12     Medical Consultants:   None.   Subjective:    Neil Henderson pain is not controlled.   Objective:    Vitals:   08/13/23 1620 08/13/23 2124 08/14/23 0134 08/14/23 0557  BP: 136/77 (!) 152/85 128/76 133/80  Pulse: 64 70 71 72  Resp: 17 16 15 16   Temp: 98.3 F (36.8 C) 98.6 F (37 C) 98.4 F (36.9 C) 98 F (36.7 C)  TempSrc: Oral Oral Oral Oral  SpO2: 96% 96% 96% 98%  Weight:      Height:       SpO2: 98 %   Intake/Output Summary (Last 24 hours) at 08/14/2023 3329 Last data filed at 08/14/2023 0600 Gross per 24 hour  Intake 566.2 ml  Output --  Net 566.2 ml   Filed Weights   08/13/23 0837  Weight: 96.3 kg    Exam: General exam: In no acute distress. Respiratory system: Good air movement and clear to auscultation. Cardiovascular system: S1 & S2 heard, RRR. No JVD. Gastrointestinal  system: Abdomen is nondistended, soft and nontender.  Extremities: No pedal edema. Skin: No rashes, lesions or ulcers Psychiatry: Judgement and insight appear normal. Mood & affect appropriate.    Data Reviewed:    Labs: Basic Metabolic Panel: Recent Labs  Lab 08/13/23 0903  NA 136  K 4.2  CL 102  CO2 25  GLUCOSE 114*  BUN 12  CREATININE 0.85  CALCIUM 9.7    GFR Estimated Creatinine Clearance: 120.3 mL/min (by C-G formula based on SCr of 0.85 mg/dL). Liver Function Tests: Recent Labs  Lab 08/13/23 0903  AST 20  ALT 18  ALKPHOS 84  BILITOT 1.2  PROT 7.9  ALBUMIN 4.4   Recent Labs  Lab 08/13/23 0903  LIPASE 43   No results for input(s): "AMMONIA" in the last 168 hours. Coagulation profile No results for input(s): "INR", "PROTIME" in the last 168 hours. COVID-19 Labs  No results for input(s): "DDIMER", "FERRITIN", "LDH", "CRP" in the last 72 hours.  Lab Results  Component Value Date   SARSCOV2NAA NEGATIVE 08/13/2023    CBC: Recent Labs  Lab 08/13/23 0903  WBC 11.2*  NEUTROABS 8.8*  HGB 16.4  HCT 48.0  MCV 90.4  PLT 239   Cardiac Enzymes: No results for input(s): "CKTOTAL", "CKMB", "CKMBINDEX", "TROPONINI" in the last 168 hours. BNP (last 3 results) No results for input(s): "PROBNP" in the last 8760 hours. CBG: No results for input(s): "GLUCAP" in the last 168 hours. D-Dimer: No results for input(s): "DDIMER" in the last 72 hours. Hgb A1c: No results for input(s): "HGBA1C" in the last 72 hours. Lipid Profile: No results for input(s): "CHOL", "HDL", "LDLCALC", "TRIG", "CHOLHDL", "LDLDIRECT" in the last 72 hours. Thyroid function studies: No results for input(s): "TSH", "T4TOTAL", "T3FREE", "THYROIDAB" in the last 72 hours.  Invalid input(s): "FREET3" Anemia work up: No results for input(s): "VITAMINB12", "FOLATE", "FERRITIN", "TIBC", "IRON", "RETICCTPCT" in the last 72 hours. Sepsis Labs: Recent Labs  Lab 08/13/23 0903  WBC 11.2*   Microbiology Recent Results (from the past 240 hours)  Resp panel by RT-PCR (RSV, Flu A&B, Covid) Anterior Nasal Swab     Status: None   Collection Time: 08/13/23  8:58 AM   Specimen: Anterior Nasal Swab  Result Value Ref Range Status   SARS Coronavirus 2 by RT PCR NEGATIVE NEGATIVE Final    Comment: (NOTE) SARS-CoV-2 target nucleic acids are NOT DETECTED.  The  SARS-CoV-2 RNA is generally detectable in upper respiratory specimens during the acute phase of infection. The lowest concentration of SARS-CoV-2 viral copies this assay can detect is 138 copies/mL. A negative result does not preclude SARS-Cov-2 infection and should not be used as the sole basis for treatment or other patient management decisions. A negative result may occur with  improper specimen collection/handling, submission of specimen other than nasopharyngeal swab, presence of viral mutation(s) within the areas targeted by this assay, and inadequate number of viral copies(<138 copies/mL). A negative result must be combined with clinical observations, patient history, and epidemiological information. The expected result is Negative.  Fact Sheet for Patients:  BloggerCourse.com  Fact Sheet for Healthcare Providers:  SeriousBroker.it  This test is no t yet approved or cleared by the [place] FDA and  has been authorized for detection and/or diagnosis of SARS-CoV-2 by FDA under an Emergency Use Authorization (EUA). This EUA will remain  in effect (meaning this test can be used) for the duration of the COVID-19 declaration under Section 564(b)(1) of the Act, 21 U.S.C.section 360bbb-3(b)(1), unless the authorization  is terminated  or revoked sooner.       Influenza A by PCR NEGATIVE NEGATIVE Final   Influenza B by PCR NEGATIVE NEGATIVE Final    Comment: (NOTE) The Xpert Xpress SARS-CoV-2/FLU/RSV plus assay is intended as an aid in the diagnosis of influenza from Nasopharyngeal swab specimens and should not be used as a sole basis for treatment. Nasal washings and aspirates are unacceptable for Xpert Xpress SARS-CoV-2/FLU/RSV testing.  Fact Sheet for Patients: BloggerCourse.com  Fact Sheet for Healthcare Providers: SeriousBroker.it  This test is not yet approved or  cleared by the [place] FDA and has been authorized for detection and/or diagnosis of SARS-CoV-2 by FDA under an Emergency Use Authorization (EUA). This EUA will remain in effect (meaning this test can be used) for the duration of the COVID-19 declaration under Section 564(b)(1) of the Act, 21 U.S.C. section 360bbb-3(b)(1), unless the authorization is terminated or revoked.     Resp Syncytial Virus by PCR NEGATIVE NEGATIVE Final    Comment: (NOTE) Fact Sheet for Patients: BloggerCourse.com  Fact Sheet for Healthcare Providers: SeriousBroker.it  This test is not yet approved or cleared by the [place] FDA and has been authorized for detection and/or diagnosis of SARS-CoV-2 by FDA under an Emergency Use Authorization (EUA). This EUA will remain in effect (meaning this test can be used) for the duration of the COVID-19 declaration under Section 564(b)(1) of the Act, 21 U.S.C. section 360bbb-3(b)(1), unless the authorization is terminated or revoked.  Performed at [facility], [address], [place], [place] [address]      Medications:    atorvastatin  80 mg Oral Daily   Continuous Infusions:  cefTRIAXone (ROCEPHIN)  IV 2 g (08/13/23 2252)   lactated ringers 125 mL/hr at 08/13/23 2247      LOS: 1 day   Marinda Elk  [facility]  08/14/2023, 6:23 AM

## 2023-08-15 DIAGNOSIS — N151 Renal and perinephric abscess: Secondary | ICD-10-CM

## 2023-08-15 LAB — CBC
HCT: 48.5 % (ref 39.0–52.0)
Hemoglobin: 16.1 g/dL (ref 13.0–17.0)
MCH: 30.6 pg (ref 26.0–34.0)
MCHC: 33.2 g/dL (ref 30.0–36.0)
MCV: 92 fL (ref 80.0–100.0)
Platelets: 257 10*3/uL (ref 150–400)
RBC: 5.27 MIL/uL (ref 4.22–5.81)
RDW: 11.9 % (ref 11.5–15.5)
WBC: 8.8 10*3/uL (ref 4.0–10.5)
nRBC: 0 % (ref 0.0–0.2)

## 2023-08-15 LAB — HEPATIC FUNCTION PANEL
ALT: 14 U/L (ref 0–44)
AST: 14 U/L — ABNORMAL LOW (ref 15–41)
Albumin: 3.9 g/dL (ref 3.5–5.0)
Alkaline Phosphatase: 76 U/L (ref 38–126)
Bilirubin, Direct: 0.3 mg/dL — ABNORMAL HIGH (ref 0.0–0.2)
Indirect Bilirubin: 1.2 mg/dL — ABNORMAL HIGH (ref 0.3–0.9)
Total Bilirubin: 1.5 mg/dL — ABNORMAL HIGH (ref 0.0–1.2)
Total Protein: 7.3 g/dL (ref 6.5–8.1)

## 2023-08-15 MED ORDER — BUPIVACAINE LIPOSOME 1.3 % IJ SUSP: 20.0000 mL | INTRAMUSCULAR | Status: DC

## 2023-08-15 MED ORDER — SODIUM CHLORIDE 0.9 % IV SOLN: 2.0000 g | INTRAVENOUS | Status: DC

## 2023-08-15 MED ORDER — ACETAMINOPHEN 500 MG PO TABS: 1000.0000 mg | ORAL_TABLET | ORAL | Status: AC

## 2023-08-15 MED ORDER — CHLORHEXIDINE GLUCONATE CLOTH 2 % EX PADS
6.0000 | MEDICATED_PAD | Freq: Once | CUTANEOUS | Status: AC
  Administered 2023-08-15: 6 via TOPICAL

## 2023-08-15 MED ORDER — ENSURE PRE-SURGERY PO LIQD
296.0000 mL | Freq: Once | ORAL | Status: AC
  Administered 2023-08-16: 296 mL via ORAL
  Filled 2023-08-15: qty 296

## 2023-08-15 MED ORDER — CHLORHEXIDINE GLUCONATE CLOTH 2 % EX PADS: 6.0000 | MEDICATED_PAD | Freq: Once | CUTANEOUS | Status: DC

## 2023-08-15 MED ORDER — GABAPENTIN 100 MG PO CAPS: 300.0000 mg | ORAL_CAPSULE | ORAL | Status: AC

## 2023-08-15 MED ORDER — CELECOXIB 200 MG PO CAPS: 200.0000 mg | ORAL_CAPSULE | ORAL | Status: AC

## 2023-08-15 NOTE — H&P (View-Only) (Signed)
Subjective: CC: Reports soreness from right mid > upper abdomen radiating to his R flank yesterday that is worse today. Tolerated cld yesterday without worsening pain, n/v. NPO currently. Passing flatus. No BM. Voiding.   Objective: Vital signs in last 24 hours: Temp:  [97.9 F (36.6 C)-98.3 F (36.8 C)] 98 F (36.7 C) (02/19 0559) Pulse Rate:  [59-69] 59 (02/19 0559) Resp:  [16-18] 18 (02/19 0559) BP: (123-142)/(75-89) 128/81 (02/19 0559) SpO2:  [95 %-98 %] 97 % (02/19 0559) Last BM Date : 08/13/23  Intake/Output from previous day: 02/18 0701 - 02/19 0700 In: 1802.9 [P.O.:240; I.V.:1562.9] Out: -  Intake/Output this shift: No intake/output data recorded.  PE: Gen:  Alert, NAD, pleasant Abd: Soft, ND, diffuse R sided ttp greatest in the RUQ. +BS  Lab Results:  Recent Labs    08/14/23 0451 08/15/23 0513  WBC 10.5 8.8  HGB 14.9 16.1  HCT 45.2 48.5  PLT 220 257   BMET Recent Labs    08/13/23 0903 08/14/23 0451  NA 136 134*  K 4.2 3.9  CL 102 100  CO2 25 24  GLUCOSE 114* 87  BUN 12 13  CREATININE 0.85 1.01  CALCIUM 9.7 9.0   PT/INR Recent Labs    08/14/23 1013  LABPROT 14.5  INR 1.1   CMP     Component Value Date/Time   NA 134 (L) 08/14/2023 0451   K 3.9 08/14/2023 0451   CL 100 08/14/2023 0451   CO2 24 08/14/2023 0451   GLUCOSE 87 08/14/2023 0451   BUN 13 08/14/2023 0451   CREATININE 1.01 08/14/2023 0451   CREATININE 1.08 04/02/2023 1456   CALCIUM 9.0 08/14/2023 0451   PROT 7.3 08/15/2023 0513   ALBUMIN 3.9 08/15/2023 0513   AST 14 (L) 08/15/2023 0513   ALT 14 08/15/2023 0513   ALKPHOS 76 08/15/2023 0513   BILITOT 1.5 (H) 08/15/2023 0513   GFRNONAA >60 08/14/2023 0451   GFRNONAA 102 12/20/2020 1102   GFRAA 118 12/20/2020 1102   Lipase     Component Value Date/Time   LIPASE 43 08/13/2023 0903    Studies/Results: MR ABDOMEN W WO CONTRAST Result Date: 08/14/2023 CLINICAL DATA:  Renal mass/cyst, indeterminate. Patient  presented with right-sided abdominal pain and had an indeterminate right renal lesion on CT. EXAM: MRI ABDOMEN WITHOUT AND WITH CONTRAST TECHNIQUE: Multiplanar multisequence MR imaging of the abdomen was performed both before and after the administration of intravenous contrast. CONTRAST:  10mL GADAVIST GADOBUTROL 1 MMOL/ML IV SOLN COMPARISON:  Right upper quadrant abdominal ultrasound and abdominopelvic CT 08/13/2023. FINDINGS: Lower chest:  The visualized lower chest appears unremarkable. Hepatobiliary: The liver has a non cirrhotic morphology without significant steatosis. No focal liver lesion or suspicious enhancement. As seen on ultrasound, there are multiple small gallbladder polyps. No significant gallbladder wall thickening, surrounding inflammation or biliary ductal dilatation. Pancreas: Unremarkable. No pancreatic ductal dilatation or surrounding inflammatory changes. Spleen: Normal in size without focal abnormality. Adrenals/Urinary Tract: Both adrenal glands appear normal. The left kidney appears normal. As seen on CT, there is a bilobed cystic lesion in the upper interpolar region of the right kidney which likely represents 2 adjacent complex cysts, measuring 2.7 x 2.6 cm, and 3.3 x 3.3 cm, respectively. Both of these lesions demonstrate heterogeneous T1 and T2 signal. Following contrast, there is no wall thickening or abnormal associated enhancement. There is persistent asymmetric perinephric soft tissue stranding on the right with fluid tracking into the right pericolic gutter. This  suggests possible superimposed infection. No hydronephrosis or perinephric soft tissue stranding on the left. Bladder not imaged. Stomach/Bowel: The stomach appears unremarkable for its degree of distension. No evidence of bowel wall thickening, distention or surrounding inflammatory change.Mildly prominent stool in the right colon, unchanged. Vascular/Lymphatic: There are no enlarged abdominal lymph nodes. No evidence of  aneurysm or large vessel occlusion. The renal veins and IVC are patent. Other: As above, asymmetric perinephric soft tissue stranding on the right with ill-defined fluid tracking into the right pericolic gutter. No generalized ascites or drainable fluid collection. Musculoskeletal: No acute or significant osseous findings. No evidence of discitis or osteomyelitis. IMPRESSION: 1. The indeterminate right renal lesion seen on CT represents two adjacent complex cysts (Bosniak 2). No evidence of solid renal mass or abnormal enhancement. Persistent associated asymmetric perinephric soft tissue stranding on the right with ill-defined fluid tracking into the right pericolic gutter, suggesting superimposed infection. Correlate clinically and with urinalysis. 2. No hydronephrosis.  The left kidney appears unremarkable. 3. Multiple small gallbladder polyps. No evidence of cholecystitis or biliary ductal dilatation. 4. No other significant findings. Electronically Signed   By: Carey Bullocks M.D.   On: 08/14/2023 18:52   CT ABDOMEN PELVIS W CONTRAST Result Date: 08/13/2023 CLINICAL DATA:  Right-sided abdominal pain. EXAM: CT ABDOMEN AND PELVIS WITH CONTRAST TECHNIQUE: Multidetector CT imaging of the abdomen and pelvis was performed using the standard protocol following bolus administration of intravenous contrast. RADIATION DOSE REDUCTION: This exam was performed according to the departmental dose-optimization program which includes automated exposure control, adjustment of the mA and/or kV according to patient size and/or use of iterative reconstruction technique. CONTRAST:  OMNIPAQUE IOHEXOL 300 MG/ML  SOLN COMPARISON:  Ultrasound dated 08/13/2023. FINDINGS: Lower chest: The visualized lung bases are clear. No intra-abdominal free air or free fluid. Hepatobiliary: Fatty liver. No biliary dilatation. No calcified gallstones. Tiny nodular densities in the gallbladder better evaluated on the ultrasound and may  represent polyps. No pericholecystic fluid. Pancreas: Unremarkable. No pancreatic ductal dilatation or surrounding inflammatory changes. Spleen: Normal in size without focal abnormality. Adrenals/Urinary Tract: The adrenal glands are unremarkable. There is a 5.7 x 3.3 cm bilobed and partially exophytic cyst arising from the upper pole of the right kidney. There is areas of higher attenuation within this cyst which may represent hemorrhage, proteinaceous debris, or less likely enhancing tissue. There is stranding and edema of the perinephric fat adjacent to the cyst. This may represent an infected cyst or an abscess. Clinical correlation and follow-up to resolution recommended to exclude malignancy. The left kidney is unremarkable. The visualized ureters and urinary bladder appear unremarkable. Stomach/Bowel: There is no bowel obstruction or active inflammation. The appendix is normal. Vascular/Lymphatic: Mild aortoiliac atherosclerotic disease. The IVC is unremarkable. No portal venous gas. There is no adenopathy. Reproductive: The prostate and seminal vesicles are grossly unremarkable. No pelvic mass. Other: None Musculoskeletal: Right L5 pars defect. No listhesis. Mild degenerative changes of spine. No acute osseous pathology. IMPRESSION: 1. Partially exophytic right renal upper pole infected cyst versus abscess. Follow-up to resolution recommended to exclude malignancy. 2. No bowel obstruction. Normal appendix. 3. Fatty liver. 4.  Aortic Atherosclerosis (ICD10-I70.0). Electronically Signed   By: Elgie Collard M.D.   On: 08/13/2023 18:05   US Abdomen Limited RUQ (LIVER/GB) Result Date: 08/13/2023 CLINICAL DATA:  [id number] RUQ abdominal pain [id number] EXAM: ULTRASOUND ABDOMEN LIMITED RIGHT UPPER QUADRANT COMPARISON:  None Available. FINDINGS: Gallbladder: Physiologically distended. There are multiple sessile gallbladder polyps with largest measuring up to  5 x 7 mm. No associated focal wall thickening. However, the  gallbladder wall is diffusely thickened measuring up to 3.5 mm. No pericholecystic free fluid. Sonographic Murphy's sign was positive as per the technologist. Common bile duct: Diameter: Less than 4 mm.  No intrahepatic bile duct dilation. Liver: No focal lesion identified. Within normal limits in parenchymal echogenicity. Portal vein is patent on color Doppler imaging with normal direction of blood flow towards the liver. Other: None. IMPRESSION: 1. There are multiple sessile gallbladder polyps, which can be characterized as low risk polyps. Largest polyp measured up to 5 x 7 mm. Follow-up ultrasound is recommended in 1 year to document stability. Please see gallbladder follow-up reference below. 2. Partially distended gallbladder and no pericholecystic free fluid; however, exhibiting mild diffuse wall thickening and technologist noted positive sonographic Murphy's sign. Findings are equivocal for acute cholecystitis but considered less likely. Correlate clinically to determine the need for additional imaging. 3. Otherwise unremarkable exam. Management of Incidentally Detected Gallbladder Polyps: Society of Radiologists in Ultrasound Consensus Conference Recommendations (published online December 28, 2020): [email or web address] Extremely low-risk polyps, i.e. pedunculated ball-on-the-wall or thin stalk: <9 mm: no follow-up 10-14 mm: follow-up at 6, 12 and 24 months >15 mm: surgical consult Low-risk polyps, i.e. pedunculated with a thick or wide stalk, or sessile: ?6 mm: no follow-up 7-9 mm follow-up ultrasound at 12 months 10-14 mm: follow-up ultrasound at 6, 12, 24, and 36 months vs surgical consult >15 mm: surgical consult Intermediate risk: focal wall-thickening >71mm adjacent to polyp: <6 mm: follow-up at 6, 12, 24, 36 months vs surgical consult >7 mm: surgical consult Electronically Signed   By: Jules Schick M.D.   On: 08/13/2023 11:12    Anti-infectives: Anti-infectives (From admission,  onward)    Start     Dose/Rate Route Frequency Ordered Stop   08/15/23 0815  cefTRIAXone (ROCEPHIN) 2 g in sodium chloride 0.9 % 100 mL IVPB        2 g 200 mL/hr over 30 Minutes Intravenous On call to O.R. 08/15/23 0454 08/16/23 0559   08/13/23 2315  cefTRIAXone (ROCEPHIN) 2 g in sodium chloride 0.9 % 100 mL IVPB        2 g 200 mL/hr over 30 Minutes Intravenous Every 24 hours 08/13/23 2228 08/20/23 2314        Assessment/Plan R sided abdominal pain  Gallbladder polyps, possible cholecystitis - [place] with mild diffuse wall thickening and multiple sessile polyps, positive sonographic murphy sign.  - CT w/ Partially exophytic right renal upper pole cyst/mass. UA negative. Urology has seen. MRI obtained. They plan for outpatient surveillance with renal US in 6mos  - He is on Rocephin which would cover for Cholecystitis however there is no evidence of Cholecystitis on MRI - it does see Multiple small gallbladder polyps. Afebrile. WBC wnl. LFT's non-elevated (direct bili 0.3) -  Will discuss w/ MD. Anticipate we will proceed w/ Lap Chole during admission. NPO midnight.   FEN - CLD. NPO midnight. IVF per [facility] VTE - SCDs, okay for chem ppx from our standpoint ID - Rocephin for UTI per indication on chart. See above.   I reviewed nursing notes, last 24 h vitals and pain scores, last 48 h intake and output, last 24 h labs and trends, and last 24 h imaging results.   LOS: 2 days    Jacinto Halim, [facility] 08/15/2023, 9:13 AM Please see Amion for pager number during day hours 7:00am-4:30pm

## 2023-08-15 NOTE — Progress Notes (Signed)
PROGRESS NOTE    Neil Henderson  GNF:[id number] DOB: 04/03/1976 DOA: 08/13/2023 PCP: Pcp, No   Brief Narrative: 48 y.o. male past medical history significant for essential hypertension comes in to the ED for abdominal pain, mainly suprapubic abdominal tenderness that moved to his right flank that started 24 hours prior to admission in the ED was found to have a white count of 11 hemoglobin of 16 UA was negative for infection abdominal ultrasound was unremarkable CT scan of the abdomen and pelvis showed an infected cyst versus right renal abscess.   Assessment & Plan:   Principal Problem:   Renal abscess Active Problems:   Essential hypertension   Hyperlipidemia   Pyelonephritis   Right complex renal cyst -MRI shows The indeterminate right renal lesion seen on CT represents two adjacent complex cysts (Bosniak 2). No evidence of solid renal mass or abnormal enhancement. Persistent associated asymmetric perinephric soft tissue stranding on the right with ill-defined fluid tracking into the right pericolic gutter, suggesting superimposed infection. Correlate clinically and with urinalysis. No hydronephrosis.  The left kidney appears unremarkable.  Multiple small gallbladder polyps. No evidence of cholecystitis biliary ductal dilatation. started empirically on IV Rocephin. Urology recommending outpatient surveillance with renal ultrasound in 6 months   Gallbladder wall polyp: Surgery following, anticipating laparoscopic cholecystectomy this admission.  Patient kept n.p.o. after midnight.  Essential hypertension Blood pressure stable with no antihypertensive medication.   Hyperlipidemia Continue statins.   Estimated body mass index is 31.35 kg/m as calculated from the following:   Height as of this encounter: 5\' 9"  (1.753 m).   Weight as of this encounter: 96.3 kg.  DVT prophylaxis: Lovenox Code Status: Full code  family Communication: None at bedside  disposition Plan:  Status  is: Inpatient Remains inpatient appropriate because: Acute illness   Consultants:  Urology and general surgery  Procedures: None Antimicrobials: Rocephin  Subjective: C/o pain right upper abdomen  Objective: Vitals:   08/14/23 1749 08/14/23 2148 08/15/23 0559 08/15/23 1353  BP: 134/89 123/78 128/81 136/86  Pulse: 64 69 (!) 59 63  Resp: 16 18 18 18   Temp: 97.9 F (36.6 C) 97.9 F (36.6 C) 98 F (36.7 C) 98.1 F (36.7 C)  TempSrc: Oral Oral Oral Oral  SpO2: 95% 98% 97% 98%  Weight:      Height:        Intake/Output Summary (Last 24 hours) at 08/15/2023 1423 Last data filed at 08/15/2023 1221 Gross per 24 hour  Intake 797.29 ml  Output --  Net 797.29 ml   Filed Weights   08/13/23 0837  Weight: 96.3 kg    Examination:  General exam: Appears calm and comfortable  Respiratory system: Clear to auscultation. Respiratory effort normal. Cardiovascular system: S1 & S2 heard, RRR. No JVD, murmurs, rubs, gallops or clicks. No pedal edema. Gastrointestinal system: Abdomen is nondistended, soft and ruq tender. No organomegaly or masses felt. Normal bowel sounds heard. Central nervous system: Alert and oriented. Extremities: Symmetric 5 x 5 power.  Data Reviewed: I have personally reviewed following labs and imaging studies  CBC: Recent Labs  Lab 08/13/23 0903 08/14/23 0451 08/15/23 0513  WBC 11.2* 10.5 8.8  NEUTROABS 8.8*  --   --   HGB 16.4 14.9 16.1  HCT 48.0 45.2 48.5  MCV 90.4 92.2 92.0  PLT 239 220 257   Basic Metabolic Panel: Recent Labs  Lab 08/13/23 0903 08/14/23 0451  NA 136 134*  K 4.2 3.9  CL 102 100  CO2 25  24  GLUCOSE 114* 87  BUN 12 13  CREATININE 0.85 1.01  CALCIUM 9.7 9.0   GFR: Estimated Creatinine Clearance: 101.2 mL/min (by C-G formula based on SCr of 1.01 mg/dL). Liver Function Tests: Recent Labs  Lab 08/13/23 0903 08/14/23 0451 08/15/23 0513  AST 20 13* 14*  ALT 18 14 14   ALKPHOS 84 74 76  BILITOT 1.2 1.5* 1.5*  PROT 7.9  6.7 7.3  ALBUMIN 4.4 3.6 3.9   Recent Labs  Lab 08/13/23 0903  LIPASE 43   No results for input(s): "AMMONIA" in the last 168 hours. Coagulation Profile: Recent Labs  Lab 08/14/23 1013  INR 1.1   Cardiac Enzymes: No results for input(s): "CKTOTAL", "CKMB", "CKMBINDEX", "TROPONINI" in the last 168 hours. BNP (last 3 results) No results for input(s): "PROBNP" in the last 8760 hours. HbA1C: No results for input(s): "HGBA1C" in the last 72 hours. CBG: No results for input(s): "GLUCAP" in the last 168 hours. Lipid Profile: No results for input(s): "CHOL", "HDL", "LDLCALC", "TRIG", "CHOLHDL", "LDLDIRECT" in the last 72 hours. Thyroid Function Tests: No results for input(s): "TSH", "T4TOTAL", "FREET4", "T3FREE", "THYROIDAB" in the last 72 hours. Anemia Panel: No results for input(s): "VITAMINB12", "FOLATE", "FERRITIN", "TIBC", "IRON", "RETICCTPCT" in the last 72 hours. Sepsis Labs: No results for input(s): "PROCALCITON", "LATICACIDVEN" in the last 168 hours.  Recent Results (from the past 240 hours)  Resp panel by RT-PCR (RSV, Flu A&B, Covid) Anterior Nasal Swab     Status: None   Collection Time: 08/13/23  8:58 AM   Specimen: Anterior Nasal Swab  Result Value Ref Range Status   SARS Coronavirus 2 by RT PCR NEGATIVE NEGATIVE Final    Comment: (NOTE) SARS-CoV-2 target nucleic acids are NOT DETECTED.  The SARS-CoV-2 RNA is generally detectable in upper respiratory specimens during the acute phase of infection. The lowest concentration of SARS-CoV-2 viral copies this assay can detect is 138 copies/mL. A negative result does not preclude SARS-Cov-2 infection and should not be used as the sole basis for treatment or other patient management decisions. A negative result may occur with  improper specimen collection/handling, submission of specimen other than nasopharyngeal swab, presence of viral mutation(s) within the areas targeted by this assay, and inadequate number of  viral copies(<138 copies/mL). A negative result must be combined with clinical observations, patient history, and epidemiological information. The expected result is Negative.  Fact Sheet for Patients:  BloggerCourse.com  Fact Sheet for Healthcare Providers:  SeriousBroker.it  This test is no t yet approved or cleared by the [place] FDA and  has been authorized for detection and/or diagnosis of SARS-CoV-2 by FDA under an Emergency Use Authorization (EUA). This EUA will remain  in effect (meaning this test can be used) for the duration of the COVID-19 declaration under Section 564(b)(1) of the Act, 21 U.S.C.section 360bbb-3(b)(1), unless the authorization is terminated  or revoked sooner.       Influenza A by PCR NEGATIVE NEGATIVE Final   Influenza B by PCR NEGATIVE NEGATIVE Final    Comment: (NOTE) The Xpert Xpress SARS-CoV-2/FLU/RSV plus assay is intended as an aid in the diagnosis of influenza from Nasopharyngeal swab specimens and should not be used as a sole basis for treatment. Nasal washings and aspirates are unacceptable for Xpert Xpress SARS-CoV-2/FLU/RSV testing.  Fact Sheet for Patients: BloggerCourse.com  Fact Sheet for Healthcare Providers: SeriousBroker.it  This test is not yet approved or cleared by the [place] FDA and has been authorized for detection and/or diagnosis of  SARS-CoV-2 by FDA under an Emergency Use Authorization (EUA). This EUA will remain in effect (meaning this test can be used) for the duration of the COVID-19 declaration under Section 564(b)(1) of the Act, 21 U.S.C. section 360bbb-3(b)(1), unless the authorization is terminated or revoked.     Resp Syncytial Virus by PCR NEGATIVE NEGATIVE Final    Comment: (NOTE) Fact Sheet for Patients: BloggerCourse.com  Fact Sheet for Healthcare  Providers: SeriousBroker.it  This test is not yet approved or cleared by the [place] FDA and has been authorized for detection and/or diagnosis of SARS-CoV-2 by FDA under an Emergency Use Authorization (EUA). This EUA will remain in effect (meaning this test can be used) for the duration of the COVID-19 declaration under Section 564(b)(1) of the Act, 21 U.S.C. section 360bbb-3(b)(1), unless the authorization is terminated or revoked.  Performed at [facility], [address] [place], [place] [address]   Surgical pcr screen     Status: None   Collection Time: 08/14/23  5:22 AM   Specimen: Nasal Mucosa; Nasal Swab  Result Value Ref Range Status   MRSA, PCR NEGATIVE NEGATIVE Final   Staphylococcus aureus NEGATIVE NEGATIVE Final    Comment: (NOTE) The Xpert SA Assay (FDA approved for NASAL specimens in patients 57 years of age and older), is one component of a comprehensive surveillance program. It is not intended to diagnose infection nor to guide or monitor treatment. Performed at [facility], [address] [place], [place] [address]          Radiology Studies: MR ABDOMEN W WO CONTRAST Result Date: 08/14/2023 CLINICAL DATA:  Renal mass/cyst, indeterminate. Patient presented with right-sided abdominal pain and had an indeterminate right renal lesion on CT. EXAM: MRI ABDOMEN WITHOUT AND WITH CONTRAST TECHNIQUE: Multiplanar multisequence MR imaging of the abdomen was performed both before and after the administration of intravenous contrast. CONTRAST:  10mL GADAVIST GADOBUTROL 1 MMOL/ML IV SOLN COMPARISON:  Right upper quadrant abdominal ultrasound and abdominopelvic CT 08/13/2023. FINDINGS: Lower chest:  The visualized lower chest appears unremarkable. Hepatobiliary: The liver has a non cirrhotic morphology without significant steatosis. No focal liver lesion or suspicious enhancement. As seen on ultrasound,  there are multiple small gallbladder polyps. No significant gallbladder wall thickening, surrounding inflammation or biliary ductal dilatation. Pancreas: Unremarkable. No pancreatic ductal dilatation or surrounding inflammatory changes. Spleen: Normal in size without focal abnormality. Adrenals/Urinary Tract: Both adrenal glands appear normal. The left kidney appears normal. As seen on CT, there is a bilobed cystic lesion in the upper interpolar region of the right kidney which likely represents 2 adjacent complex cysts, measuring 2.7 x 2.6 cm, and 3.3 x 3.3 cm, respectively. Both of these lesions demonstrate heterogeneous T1 and T2 signal. Following contrast, there is no wall thickening or abnormal associated enhancement. There is persistent asymmetric perinephric soft tissue stranding on the right with fluid tracking into the right pericolic gutter. This suggests possible superimposed infection. No hydronephrosis or perinephric soft tissue stranding on the left. Bladder not imaged. Stomach/Bowel: The stomach appears unremarkable for its degree of distension. No evidence of bowel wall thickening, distention or surrounding inflammatory change.Mildly prominent stool in the right colon, unchanged. Vascular/Lymphatic: There are no enlarged abdominal lymph nodes. No evidence of aneurysm or large vessel occlusion. The renal veins and IVC are patent. Other: As above, asymmetric perinephric soft tissue stranding on the right with ill-defined fluid tracking into the right pericolic gutter. No generalized ascites or drainable fluid collection. Musculoskeletal: No acute  or significant osseous findings. No evidence of discitis or osteomyelitis. IMPRESSION: 1. The indeterminate right renal lesion seen on CT represents two adjacent complex cysts (Bosniak 2). No evidence of solid renal mass or abnormal enhancement. Persistent associated asymmetric perinephric soft tissue stranding on the right with ill-defined fluid tracking into  the right pericolic gutter, suggesting superimposed infection. Correlate clinically and with urinalysis. 2. No hydronephrosis.  The left kidney appears unremarkable. 3. Multiple small gallbladder polyps. No evidence of cholecystitis or biliary ductal dilatation. 4. No other significant findings. Electronically Signed   By: Carey Bullocks M.D.   On: 08/14/2023 18:52   CT ABDOMEN PELVIS W CONTRAST Result Date: 08/13/2023 CLINICAL DATA:  Right-sided abdominal pain. EXAM: CT ABDOMEN AND PELVIS WITH CONTRAST TECHNIQUE: Multidetector CT imaging of the abdomen and pelvis was performed using the standard protocol following bolus administration of intravenous contrast. RADIATION DOSE REDUCTION: This exam was performed according to the departmental dose-optimization program which includes automated exposure control, adjustment of the mA and/or kV according to patient size and/or use of iterative reconstruction technique. CONTRAST:  OMNIPAQUE IOHEXOL 300 MG/ML  SOLN COMPARISON:  Ultrasound dated 08/13/2023. FINDINGS: Lower chest: The visualized lung bases are clear. No intra-abdominal free air or free fluid. Hepatobiliary: Fatty liver. No biliary dilatation. No calcified gallstones. Tiny nodular densities in the gallbladder better evaluated on the ultrasound and may represent polyps. No pericholecystic fluid. Pancreas: Unremarkable. No pancreatic ductal dilatation or surrounding inflammatory changes. Spleen: Normal in size without focal abnormality. Adrenals/Urinary Tract: The adrenal glands are unremarkable. There is a 5.7 x 3.3 cm bilobed and partially exophytic cyst arising from the upper pole of the right kidney. There is areas of higher attenuation within this cyst which may represent hemorrhage, proteinaceous debris, or less likely enhancing tissue. There is stranding and edema of the perinephric fat adjacent to the cyst. This may represent an infected cyst or an abscess. Clinical correlation and follow-up to  resolution recommended to exclude malignancy. The left kidney is unremarkable. The visualized ureters and urinary bladder appear unremarkable. Stomach/Bowel: There is no bowel obstruction or active inflammation. The appendix is normal. Vascular/Lymphatic: Mild aortoiliac atherosclerotic disease. The IVC is unremarkable. No portal venous gas. There is no adenopathy. Reproductive: The prostate and seminal vesicles are grossly unremarkable. No pelvic mass. Other: None Musculoskeletal: Right L5 pars defect. No listhesis. Mild degenerative changes of spine. No acute osseous pathology. IMPRESSION: 1. Partially exophytic right renal upper pole infected cyst versus abscess. Follow-up to resolution recommended to exclude malignancy. 2. No bowel obstruction. Normal appendix. 3. Fatty liver. 4.  Aortic Atherosclerosis (ICD10-I70.0). Electronically Signed   By: Elgie Collard M.D.   On: 08/13/2023 18:05    Scheduled Meds:  acetaminophen  1,000 mg Oral On Call to OR   atorvastatin  80 mg Oral Daily   [START ON 08/16/2023] bupivacaine liposome  20 mL Infiltration On Call to OR   celecoxib  200 mg Oral On Call to OR   Chlorhexidine Gluconate Cloth  6 each Topical Once   And   Chlorhexidine Gluconate Cloth  6 each Topical Once   [START ON 08/16/2023] feeding supplement  296 mL Oral Once   gabapentin  300 mg Oral On Call to OR   nicotine  7 mg Transdermal Once   polyethylene glycol  17 g Oral BID   Continuous Infusions:  cefTRIAXone (ROCEPHIN)  IV 2 g (08/14/23 2220)   cefTRIAXone (ROCEPHIN)  IV     lactated ringers 125 mL/hr at 08/14/23 1638  LOS: 2 days    Alwyn Ren, MD  08/15/2023, 2:23 PM

## 2023-08-15 NOTE — Progress Notes (Signed)
Subjective/Chief Complaint:  1- Rt Minimally Complex Renal Cyst - Rt mid lateral 3cm adjoining cyst x 2 incidental on CT 2/205. Some ? Wall thickening and stranding. Dedicated MRI with B2 cyst no enhancing nodules / overt neoplastic features. No mass effect / hydro. UA negative. No fevers.  Today "Neil Henderson" is stable. Chole pending.    Objective: Vital signs in last 24 hours: Temp:  [97.9 F (36.6 C)-98.3 F (36.8 C)] 98 F (36.7 C) (02/19 0559) Pulse Rate:  [59-69] 59 (02/19 0559) Resp:  [16-18] 18 (02/19 0559) BP: (123-142)/(75-89) 128/81 (02/19 0559) SpO2:  [95 %-98 %] 97 % (02/19 0559) Last BM Date : 08/13/23  Intake/Output from previous day: 02/18 0701 - 02/19 0700 In: 1802.9 [P.O.:240; I.V.:1562.9] Out: -  Intake/Output this shift: No intake/output data recorded.  NAD, AOx3, listening to podcast Non-labore breathing on RA RRR Minimal RUQ pain at present, No CVAT No c/c/e  Lab Results:  Recent Labs    08/14/23 0451 08/15/23 0513  WBC 10.5 8.8  HGB 14.9 16.1  HCT 45.2 48.5  PLT 220 257   BMET Recent Labs    08/13/23 0903 08/14/23 0451  NA 136 134*  K 4.2 3.9  CL 102 100  CO2 25 24  GLUCOSE 114* 87  BUN 12 13  CREATININE 0.85 1.01  CALCIUM 9.7 9.0   PT/INR Recent Labs    08/14/23 1013  LABPROT 14.5  INR 1.1   ABG No results for input(s): "PHART", "HCO3" in the last 72 hours.  Invalid input(s): "PCO2", "PO2"  Studies/Results: MR ABDOMEN W WO CONTRAST Result Date: 08/14/2023 CLINICAL DATA:  Renal mass/cyst, indeterminate. Patient presented with right-sided abdominal pain and had an indeterminate right renal lesion on CT. EXAM: MRI ABDOMEN WITHOUT AND WITH CONTRAST TECHNIQUE: Multiplanar multisequence MR imaging of the abdomen was performed both before and after the administration of intravenous contrast. CONTRAST:  10mL GADAVIST GADOBUTROL 1 MMOL/ML IV SOLN COMPARISON:  Right upper quadrant abdominal ultrasound and abdominopelvic CT  08/13/2023. FINDINGS: Lower chest:  The visualized lower chest appears unremarkable. Hepatobiliary: The liver has a non cirrhotic morphology without significant steatosis. No focal liver lesion or suspicious enhancement. As seen on ultrasound, there are multiple small gallbladder polyps. No significant gallbladder wall thickening, surrounding inflammation or biliary ductal dilatation. Pancreas: Unremarkable. No pancreatic ductal dilatation or surrounding inflammatory changes. Spleen: Normal in size without focal abnormality. Adrenals/Urinary Tract: Both adrenal glands appear normal. The left kidney appears normal. As seen on CT, there is a bilobed cystic lesion in the upper interpolar region of the right kidney which likely represents 2 adjacent complex cysts, measuring 2.7 x 2.6 cm, and 3.3 x 3.3 cm, respectively. Both of these lesions demonstrate heterogeneous T1 and T2 signal. Following contrast, there is no wall thickening or abnormal associated enhancement. There is persistent asymmetric perinephric soft tissue stranding on the right with fluid tracking into the right pericolic gutter. This suggests possible superimposed infection. No hydronephrosis or perinephric soft tissue stranding on the left. Bladder not imaged. Stomach/Bowel: The stomach appears unremarkable for its degree of distension. No evidence of bowel wall thickening, distention or surrounding inflammatory change.Mildly prominent stool in the right colon, unchanged. Vascular/Lymphatic: There are no enlarged abdominal lymph nodes. No evidence of aneurysm or large vessel occlusion. The renal veins and IVC are patent. Other: As above, asymmetric perinephric soft tissue stranding on the right with ill-defined fluid tracking into the right pericolic gutter. No generalized ascites or drainable fluid collection. Musculoskeletal: No acute or significant  osseous findings. No evidence of discitis or osteomyelitis. IMPRESSION: 1. The indeterminate right  renal lesion seen on CT represents two adjacent complex cysts (Bosniak 2). No evidence of solid renal mass or abnormal enhancement. Persistent associated asymmetric perinephric soft tissue stranding on the right with ill-defined fluid tracking into the right pericolic gutter, suggesting superimposed infection. Correlate clinically and with urinalysis. 2. No hydronephrosis.  The left kidney appears unremarkable. 3. Multiple small gallbladder polyps. No evidence of cholecystitis or biliary ductal dilatation. 4. No other significant findings. Electronically Signed   By: Carey Bullocks M.D.   On: 08/14/2023 18:52   CT ABDOMEN PELVIS W CONTRAST Result Date: 08/13/2023 CLINICAL DATA:  Right-sided abdominal pain. EXAM: CT ABDOMEN AND PELVIS WITH CONTRAST TECHNIQUE: Multidetector CT imaging of the abdomen and pelvis was performed using the standard protocol following bolus administration of intravenous contrast. RADIATION DOSE REDUCTION: This exam was performed according to the departmental dose-optimization program which includes automated exposure control, adjustment of the mA and/or kV according to patient size and/or use of iterative reconstruction technique. CONTRAST:  OMNIPAQUE IOHEXOL 300 MG/ML  SOLN COMPARISON:  Ultrasound dated 08/13/2023. FINDINGS: Lower chest: The visualized lung bases are clear. No intra-abdominal free air or free fluid. Hepatobiliary: Fatty liver. No biliary dilatation. No calcified gallstones. Tiny nodular densities in the gallbladder better evaluated on the ultrasound and may represent polyps. No pericholecystic fluid. Pancreas: Unremarkable. No pancreatic ductal dilatation or surrounding inflammatory changes. Spleen: Normal in size without focal abnormality. Adrenals/Urinary Tract: The adrenal glands are unremarkable. There is a 5.7 x 3.3 cm bilobed and partially exophytic cyst arising from the upper pole of the right kidney. There is areas of higher attenuation within this cyst  which may represent hemorrhage, proteinaceous debris, or less likely enhancing tissue. There is stranding and edema of the perinephric fat adjacent to the cyst. This may represent an infected cyst or an abscess. Clinical correlation and follow-up to resolution recommended to exclude malignancy. The left kidney is unremarkable. The visualized ureters and urinary bladder appear unremarkable. Stomach/Bowel: There is no bowel obstruction or active inflammation. The appendix is normal. Vascular/Lymphatic: Mild aortoiliac atherosclerotic disease. The IVC is unremarkable. No portal venous gas. There is no adenopathy. Reproductive: The prostate and seminal vesicles are grossly unremarkable. No pelvic mass. Other: None Musculoskeletal: Right L5 pars defect. No listhesis. Mild degenerative changes of spine. No acute osseous pathology. IMPRESSION: 1. Partially exophytic right renal upper pole infected cyst versus abscess. Follow-up to resolution recommended to exclude malignancy. 2. No bowel obstruction. Normal appendix. 3. Fatty liver. 4.  Aortic Atherosclerosis (ICD10-I70.0). Electronically Signed   By: Elgie Collard M.D.   On: 08/13/2023 18:05   US Abdomen Limited RUQ (LIVER/GB) Result Date: 08/13/2023 CLINICAL DATA:  151470 RUQ abdominal pain [id number] EXAM: ULTRASOUND ABDOMEN LIMITED RIGHT UPPER QUADRANT COMPARISON:  None Available. FINDINGS: Gallbladder: Physiologically distended. There are multiple sessile gallbladder polyps with largest measuring up to 5 x 7 mm. No associated focal wall thickening. However, the gallbladder wall is diffusely thickened measuring up to 3.5 mm. No pericholecystic free fluid. Sonographic Murphy's sign was positive as per the technologist. Common bile duct: Diameter: Less than 4 mm.  No intrahepatic bile duct dilation. Liver: No focal lesion identified. Within normal limits in parenchymal echogenicity. Portal vein is patent on color Doppler imaging with normal direction of blood flow  towards the liver. Other: None. IMPRESSION: 1. There are multiple sessile gallbladder polyps, which can be characterized as low risk polyps. Largest polyp measured up to  5 x 7 mm. Follow-up ultrasound is recommended in 1 year to document stability. Please see gallbladder follow-up reference below. 2. Partially distended gallbladder and no pericholecystic free fluid; however, exhibiting mild diffuse wall thickening and technologist noted positive sonographic Murphy's sign. Findings are equivocal for acute cholecystitis but considered less likely. Correlate clinically to determine the need for additional imaging. 3. Otherwise unremarkable exam. Management of Incidentally Detected Gallbladder Polyps: Society of Radiologists in Ultrasound Consensus Conference Recommendations (published online December 28, 2020): [email or web address] Extremely low-risk polyps, i.e. pedunculated ball-on-the-wall or thin stalk: <9 mm: no follow-up 10-14 mm: follow-up at 6, 12 and 24 months >15 mm: surgical consult Low-risk polyps, i.e. pedunculated with a thick or wide stalk, or sessile: ?6 mm: no follow-up 7-9 mm follow-up ultrasound at 12 months 10-14 mm: follow-up ultrasound at 6, 12, 24, and 36 months vs surgical consult >15 mm: surgical consult Intermediate risk: focal wall-thickening >22mm adjacent to polyp: <6 mm: follow-up at 6, 12, 24, 36 months vs surgical consult >7 mm: surgical consult Electronically Signed   By: Jules Schick M.D.   On: 08/13/2023 11:12    Anti-infectives: Anti-infectives (From admission, onward)    Start     Dose/Rate Route Frequency Ordered Stop   08/15/23 0815  cefTRIAXone (ROCEPHIN) 2 g in sodium chloride 0.9 % 100 mL IVPB        2 g 200 mL/hr over 30 Minutes Intravenous On call to O.R. 08/15/23 0454 08/16/23 0559   08/13/23 2315  cefTRIAXone (ROCEPHIN) 2 g in sodium chloride 0.9 % 100 mL IVPB        2 g 200 mL/hr over 30 Minutes Intravenous Every 24 hours 08/13/23 2228 08/20/23  2314       Assessment/Plan:  1- Rt Minimally Complex Renal Cyst - MRI quite helpful and rules out significant malignany or abscess. He has no predisposing infectious parameters. Will only rec outpatient surveillance with renal US in 6mos, we will arrange.   Please call me directly with questions at anytime.      Loletta Parish. 08/15/2023

## 2023-08-15 NOTE — Progress Notes (Signed)
Subjective: CC: Reports soreness from right mid > upper abdomen radiating to his R flank yesterday that is worse today. Tolerated cld yesterday without worsening pain, n/v. NPO currently. Passing flatus. No BM. Voiding.   Objective: Vital signs in last 24 hours: Temp:  [97.9 F (36.6 C)-98.3 F (36.8 C)] 98 F (36.7 C) (02/19 0559) Pulse Rate:  [59-69] 59 (02/19 0559) Resp:  [16-18] 18 (02/19 0559) BP: (123-142)/(75-89) 128/81 (02/19 0559) SpO2:  [95 %-98 %] 97 % (02/19 0559) Last BM Date : 08/13/23  Intake/Output from previous day: 02/18 0701 - 02/19 0700 In: 1802.9 [P.O.:240; I.V.:1562.9] Out: -  Intake/Output this shift: No intake/output data recorded.  PE: Gen:  Alert, NAD, pleasant Abd: Soft, ND, diffuse R sided ttp greatest in the RUQ. +BS  Lab Results:  Recent Labs    08/14/23 0451 08/15/23 0513  WBC 10.5 8.8  HGB 14.9 16.1  HCT 45.2 48.5  PLT 220 257   BMET Recent Labs    08/13/23 0903 08/14/23 0451  NA 136 134*  K 4.2 3.9  CL 102 100  CO2 25 24  GLUCOSE 114* 87  BUN 12 13  CREATININE 0.85 1.01  CALCIUM 9.7 9.0   PT/INR Recent Labs    08/14/23 1013  LABPROT 14.5  INR 1.1   CMP     Component Value Date/Time   NA 134 (L) 08/14/2023 0451   K 3.9 08/14/2023 0451   CL 100 08/14/2023 0451   CO2 24 08/14/2023 0451   GLUCOSE 87 08/14/2023 0451   BUN 13 08/14/2023 0451   CREATININE 1.01 08/14/2023 0451   CREATININE 1.08 04/02/2023 1456   CALCIUM 9.0 08/14/2023 0451   PROT 7.3 08/15/2023 0513   ALBUMIN 3.9 08/15/2023 0513   AST 14 (L) 08/15/2023 0513   ALT 14 08/15/2023 0513   ALKPHOS 76 08/15/2023 0513   BILITOT 1.5 (H) 08/15/2023 0513   GFRNONAA >60 08/14/2023 0451   GFRNONAA 102 12/20/2020 1102   GFRAA 118 12/20/2020 1102   Lipase     Component Value Date/Time   LIPASE 43 08/13/2023 0903    Studies/Results: MR ABDOMEN W WO CONTRAST Result Date: 08/14/2023 CLINICAL DATA:  Renal mass/cyst, indeterminate. Patient  presented with right-sided abdominal pain and had an indeterminate right renal lesion on CT. EXAM: MRI ABDOMEN WITHOUT AND WITH CONTRAST TECHNIQUE: Multiplanar multisequence MR imaging of the abdomen was performed both before and after the administration of intravenous contrast. CONTRAST:  10mL GADAVIST GADOBUTROL 1 MMOL/ML IV SOLN COMPARISON:  Right upper quadrant abdominal ultrasound and abdominopelvic CT 08/13/2023. FINDINGS: Lower chest:  The visualized lower chest appears unremarkable. Hepatobiliary: The liver has a non cirrhotic morphology without significant steatosis. No focal liver lesion or suspicious enhancement. As seen on ultrasound, there are multiple small gallbladder polyps. No significant gallbladder wall thickening, surrounding inflammation or biliary ductal dilatation. Pancreas: Unremarkable. No pancreatic ductal dilatation or surrounding inflammatory changes. Spleen: Normal in size without focal abnormality. Adrenals/Urinary Tract: Both adrenal glands appear normal. The left kidney appears normal. As seen on CT, there is a bilobed cystic lesion in the upper interpolar region of the right kidney which likely represents 2 adjacent complex cysts, measuring 2.7 x 2.6 cm, and 3.3 x 3.3 cm, respectively. Both of these lesions demonstrate heterogeneous T1 and T2 signal. Following contrast, there is no wall thickening or abnormal associated enhancement. There is persistent asymmetric perinephric soft tissue stranding on the right with fluid tracking into the right pericolic gutter. This  suggests possible superimposed infection. No hydronephrosis or perinephric soft tissue stranding on the left. Bladder not imaged. Stomach/Bowel: The stomach appears unremarkable for its degree of distension. No evidence of bowel wall thickening, distention or surrounding inflammatory change.Mildly prominent stool in the right colon, unchanged. Vascular/Lymphatic: There are no enlarged abdominal lymph nodes. No evidence of  aneurysm or large vessel occlusion. The renal veins and IVC are patent. Other: As above, asymmetric perinephric soft tissue stranding on the right with ill-defined fluid tracking into the right pericolic gutter. No generalized ascites or drainable fluid collection. Musculoskeletal: No acute or significant osseous findings. No evidence of discitis or osteomyelitis. IMPRESSION: 1. The indeterminate right renal lesion seen on CT represents two adjacent complex cysts (Bosniak 2). No evidence of solid renal mass or abnormal enhancement. Persistent associated asymmetric perinephric soft tissue stranding on the right with ill-defined fluid tracking into the right pericolic gutter, suggesting superimposed infection. Correlate clinically and with urinalysis. 2. No hydronephrosis.  The left kidney appears unremarkable. 3. Multiple small gallbladder polyps. No evidence of cholecystitis or biliary ductal dilatation. 4. No other significant findings. Electronically Signed   By: Carey Bullocks M.D.   On: 08/14/2023 18:52   CT ABDOMEN PELVIS W CONTRAST Result Date: 08/13/2023 CLINICAL DATA:  Right-sided abdominal pain. EXAM: CT ABDOMEN AND PELVIS WITH CONTRAST TECHNIQUE: Multidetector CT imaging of the abdomen and pelvis was performed using the standard protocol following bolus administration of intravenous contrast. RADIATION DOSE REDUCTION: This exam was performed according to the departmental dose-optimization program which includes automated exposure control, adjustment of the mA and/or kV according to patient size and/or use of iterative reconstruction technique. CONTRAST:  OMNIPAQUE IOHEXOL 300 MG/ML  SOLN COMPARISON:  Ultrasound dated 08/13/2023. FINDINGS: Lower chest: The visualized lung bases are clear. No intra-abdominal free air or free fluid. Hepatobiliary: Fatty liver. No biliary dilatation. No calcified gallstones. Tiny nodular densities in the gallbladder better evaluated on the ultrasound and may  represent polyps. No pericholecystic fluid. Pancreas: Unremarkable. No pancreatic ductal dilatation or surrounding inflammatory changes. Spleen: Normal in size without focal abnormality. Adrenals/Urinary Tract: The adrenal glands are unremarkable. There is a 5.7 x 3.3 cm bilobed and partially exophytic cyst arising from the upper pole of the right kidney. There is areas of higher attenuation within this cyst which may represent hemorrhage, proteinaceous debris, or less likely enhancing tissue. There is stranding and edema of the perinephric fat adjacent to the cyst. This may represent an infected cyst or an abscess. Clinical correlation and follow-up to resolution recommended to exclude malignancy. The left kidney is unremarkable. The visualized ureters and urinary bladder appear unremarkable. Stomach/Bowel: There is no bowel obstruction or active inflammation. The appendix is normal. Vascular/Lymphatic: Mild aortoiliac atherosclerotic disease. The IVC is unremarkable. No portal venous gas. There is no adenopathy. Reproductive: The prostate and seminal vesicles are grossly unremarkable. No pelvic mass. Other: None Musculoskeletal: Right L5 pars defect. No listhesis. Mild degenerative changes of spine. No acute osseous pathology. IMPRESSION: 1. Partially exophytic right renal upper pole infected cyst versus abscess. Follow-up to resolution recommended to exclude malignancy. 2. No bowel obstruction. Normal appendix. 3. Fatty liver. 4.  Aortic Atherosclerosis (ICD10-I70.0). Electronically Signed   By: Elgie Collard M.D.   On: 08/13/2023 18:05   US Abdomen Limited RUQ (LIVER/GB) Result Date: 08/13/2023 CLINICAL DATA:  151470 RUQ abdominal pain [id number] EXAM: ULTRASOUND ABDOMEN LIMITED RIGHT UPPER QUADRANT COMPARISON:  None Available. FINDINGS: Gallbladder: Physiologically distended. There are multiple sessile gallbladder polyps with largest measuring up to  5 x 7 mm. No associated focal wall thickening. However, the  gallbladder wall is diffusely thickened measuring up to 3.5 mm. No pericholecystic free fluid. Sonographic Murphy's sign was positive as per the technologist. Common bile duct: Diameter: Less than 4 mm.  No intrahepatic bile duct dilation. Liver: No focal lesion identified. Within normal limits in parenchymal echogenicity. Portal vein is patent on color Doppler imaging with normal direction of blood flow towards the liver. Other: None. IMPRESSION: 1. There are multiple sessile gallbladder polyps, which can be characterized as low risk polyps. Largest polyp measured up to 5 x 7 mm. Follow-up ultrasound is recommended in 1 year to document stability. Please see gallbladder follow-up reference below. 2. Partially distended gallbladder and no pericholecystic free fluid; however, exhibiting mild diffuse wall thickening and technologist noted positive sonographic Murphy's sign. Findings are equivocal for acute cholecystitis but considered less likely. Correlate clinically to determine the need for additional imaging. 3. Otherwise unremarkable exam. Management of Incidentally Detected Gallbladder Polyps: Society of Radiologists in Ultrasound Consensus Conference Recommendations (published online December 28, 2020): [email or web address] Extremely low-risk polyps, i.e. pedunculated ball-on-the-wall or thin stalk: <9 mm: no follow-up 10-14 mm: follow-up at 6, 12 and 24 months >15 mm: surgical consult Low-risk polyps, i.e. pedunculated with a thick or wide stalk, or sessile: ?6 mm: no follow-up 7-9 mm follow-up ultrasound at 12 months 10-14 mm: follow-up ultrasound at 6, 12, 24, and 36 months vs surgical consult >15 mm: surgical consult Intermediate risk: focal wall-thickening >71mm adjacent to polyp: <6 mm: follow-up at 6, 12, 24, 36 months vs surgical consult >7 mm: surgical consult Electronically Signed   By: Jules Schick M.D.   On: 08/13/2023 11:12    Anti-infectives: Anti-infectives (From admission,  onward)    Start     Dose/Rate Route Frequency Ordered Stop   08/15/23 0815  cefTRIAXone (ROCEPHIN) 2 g in sodium chloride 0.9 % 100 mL IVPB        2 g 200 mL/hr over 30 Minutes Intravenous On call to O.R. 08/15/23 0454 08/16/23 0559   08/13/23 2315  cefTRIAXone (ROCEPHIN) 2 g in sodium chloride 0.9 % 100 mL IVPB        2 g 200 mL/hr over 30 Minutes Intravenous Every 24 hours 08/13/23 2228 08/20/23 2314        Assessment/Plan R sided abdominal pain  Gallbladder polyps, possible cholecystitis - [place] with mild diffuse wall thickening and multiple sessile polyps, positive sonographic murphy sign.  - CT w/ Partially exophytic right renal upper pole cyst/mass. UA negative. Urology has seen. MRI obtained. They plan for outpatient surveillance with renal US in 6mos  - He is on Rocephin which would cover for Cholecystitis however there is no evidence of Cholecystitis on MRI - it does see Multiple small gallbladder polyps. Afebrile. WBC wnl. LFT's non-elevated (direct bili 0.3) -  Will discuss w/ MD. Anticipate we will proceed w/ Lap Chole during admission. NPO midnight.   FEN - CLD. NPO midnight. IVF per [facility] VTE - SCDs, okay for chem ppx from our standpoint ID - Rocephin for UTI per indication on chart. See above.   I reviewed nursing notes, last 24 h vitals and pain scores, last 48 h intake and output, last 24 h labs and trends, and last 24 h imaging results.   LOS: 2 days    Jacinto Halim, [facility] 08/15/2023, 9:13 AM Please see Amion for pager number during day hours 7:00am-4:30pm

## 2023-08-16 ENCOUNTER — Inpatient Hospital Stay (HOSPITAL_COMMUNITY): Payer: BC Managed Care – PPO

## 2023-08-16 ENCOUNTER — Inpatient Hospital Stay (HOSPITAL_COMMUNITY): Payer: BC Managed Care – PPO | Admitting: Anesthesiology

## 2023-08-16 ENCOUNTER — Other Ambulatory Visit: Payer: Self-pay

## 2023-08-16 ENCOUNTER — Encounter (HOSPITAL_COMMUNITY)
Admission: EM | Disposition: A | Discharge status: Home / Self Care | Payer: Self-pay | Source: Home / Self Care | Attending: Internal Medicine

## 2023-08-16 DIAGNOSIS — K819 Cholecystitis, unspecified: Secondary | ICD-10-CM | POA: Diagnosis not present

## 2023-08-16 DIAGNOSIS — N151 Renal and perinephric abscess: Secondary | ICD-10-CM | POA: Diagnosis not present

## 2023-08-16 HISTORY — PX: CHOLECYSTECTOMY: SHX55

## 2023-08-16 LAB — CBC
HCT: 43.6 % (ref 39.0–52.0)
Hemoglobin: 14.8 g/dL (ref 13.0–17.0)
MCH: 30.2 pg (ref 26.0–34.0)
MCHC: 33.9 g/dL (ref 30.0–36.0)
MCV: 89 fL (ref 80.0–100.0)
Platelets: 243 10*3/uL (ref 150–400)
RBC: 4.9 MIL/uL (ref 4.22–5.81)
RDW: 11.8 % (ref 11.5–15.5)
WBC: 5.7 10*3/uL (ref 4.0–10.5)
nRBC: 0 % (ref 0.0–0.2)

## 2023-08-16 LAB — COMPREHENSIVE METABOLIC PANEL
ALT: 13 U/L (ref 0–44)
AST: 13 U/L — ABNORMAL LOW (ref 15–41)
Albumin: 3.6 g/dL (ref 3.5–5.0)
Alkaline Phosphatase: 65 U/L (ref 38–126)
Anion gap: 10 (ref 5–15)
BUN: 10 mg/dL (ref 6–20)
CO2: 26 mmol/L (ref 22–32)
Calcium: 9 mg/dL (ref 8.9–10.3)
Chloride: 104 mmol/L (ref 98–111)
Creatinine, Ser: 0.93 mg/dL (ref 0.61–1.24)
GFR, Estimated: >60 mL/min (ref >60)
Glucose, Bld: 78 mg/dL (ref 70–99)
Potassium: 4.3 mmol/L (ref 3.5–5.1)
Sodium: 140 mmol/L (ref 135–145)
Total Bilirubin: 0.9 mg/dL (ref 0.0–1.2)
Total Protein: 6.5 g/dL (ref 6.5–8.1)

## 2023-08-16 SURGERY — LAPAROSCOPIC CHOLECYSTECTOMY WITH INTRAOPERATIVE CHOLANGIOGRAM
Anesthesia: General

## 2023-08-16 MED ORDER — MAGIC MOUTHWASH
15.0000 mL | Freq: Four times a day (QID) | ORAL | Status: DC | PRN
  Filled 2023-08-16: qty 15

## 2023-08-16 MED ORDER — FENTANYL CITRATE (PF) 100 MCG/2ML IJ SOLN
INTRAMUSCULAR | Status: DC | PRN
  Administered 2023-08-16: 100 ug via INTRAVENOUS
  Administered 2023-08-16: 50 ug via INTRAVENOUS

## 2023-08-16 MED ORDER — MIDAZOLAM HCL 5 MG/5ML IJ SOLN
INTRAMUSCULAR | Status: DC | PRN
  Administered 2023-08-16: 2 mg via INTRAVENOUS

## 2023-08-16 MED ORDER — LACTATED RINGERS IV BOLUS
1000.0000 mL | Freq: Once | INTRAVENOUS | Status: AC
  Administered 2023-08-16: 1000 mL via INTRAVENOUS

## 2023-08-16 MED ORDER — HYDROMORPHONE HCL 1 MG/ML IJ SOLN
INTRAMUSCULAR | Status: AC
  Administered 2023-08-16: 0.5 mg via INTRAVENOUS
  Filled 2023-08-16: qty 1

## 2023-08-16 MED ORDER — PROPOFOL 10 MG/ML IV BOLUS
INTRAVENOUS | Status: AC
  Filled 2023-08-16: qty 20

## 2023-08-16 MED ORDER — FENTANYL CITRATE (PF) 100 MCG/2ML IJ SOLN
INTRAMUSCULAR | Status: AC
  Filled 2023-08-16: qty 2

## 2023-08-16 MED ORDER — METHOCARBAMOL 500 MG PO TABS
1000.0000 mg | ORAL_TABLET | Freq: Four times a day (QID) | ORAL | Status: DC | PRN
  Administered 2023-08-16 – 2023-08-17 (×2): 1000 mg via ORAL
  Filled 2023-08-16 (×2): qty 2

## 2023-08-16 MED ORDER — NAPHAZOLINE-GLYCERIN 0.012-0.25 % OP SOLN
1.0000 [drp] | Freq: Four times a day (QID) | OPHTHALMIC | Status: DC | PRN
  Filled 2023-08-16: qty 15

## 2023-08-16 MED ORDER — LACTATED RINGERS IV BOLUS: 1000.0000 mL | Freq: Three times a day (TID) | INTRAVENOUS | Status: DC | PRN

## 2023-08-16 MED ORDER — BUPIVACAINE-EPINEPHRINE 0.25% -1:200000 IJ SOLN
INTRAMUSCULAR | Status: AC
  Filled 2023-08-16: qty 1

## 2023-08-16 MED ORDER — METOPROLOL TARTRATE 5 MG/5ML IV SOLN: 5.0000 mg | Freq: Four times a day (QID) | INTRAVENOUS | Status: DC | PRN

## 2023-08-16 MED ORDER — SODIUM CHLORIDE 0.9% FLUSH: 3.0000 mL | INTRAVENOUS | Status: DC | PRN

## 2023-08-16 MED ORDER — ONDANSETRON HCL 4 MG PO TABS: 4.0000 mg | ORAL_TABLET | Freq: Three times a day (TID) | ORAL | 5 refills | Status: AC | PRN

## 2023-08-16 MED ORDER — MEPERIDINE HCL 50 MG/ML IJ SOLN: 6.2500 mg | INTRAMUSCULAR | Status: DC | PRN

## 2023-08-16 MED ORDER — ENALAPRILAT 1.25 MG/ML IV SOLN: 0.6250 mg | Freq: Four times a day (QID) | INTRAVENOUS | Status: DC | PRN

## 2023-08-16 MED ORDER — SIMETHICONE 40 MG/0.6ML PO SUSP: 80.0000 mg | Freq: Four times a day (QID) | ORAL | Status: DC | PRN

## 2023-08-16 MED ORDER — OXYCODONE HCL 5 MG/5ML PO SOLN: 5.0000 mg | Freq: Once | ORAL | Status: DC | PRN

## 2023-08-16 MED ORDER — SODIUM CHLORIDE 0.9 % IV SOLN: 12.5000 mg | INTRAVENOUS | Status: DC | PRN

## 2023-08-16 MED ORDER — BUPIVACAINE LIPOSOME 1.3 % IJ SUSP
INTRAMUSCULAR | Status: DC | PRN
Start: 2023-08-16 — End: 2023-08-16
  Administered 2023-08-16: 20 mL

## 2023-08-16 MED ORDER — OXYCODONE HCL 5 MG PO TABS: 5.0000 mg | ORAL_TABLET | Freq: Once | ORAL | Status: DC | PRN

## 2023-08-16 MED ORDER — NICOTINE 21 MG/24HR TD PT24
21.0000 mg | MEDICATED_PATCH | Freq: Every day | TRANSDERMAL | Status: DC
  Administered 2023-08-16 – 2023-08-17 (×2): 21 mg via TRANSDERMAL
  Filled 2023-08-16 (×2): qty 1

## 2023-08-16 MED ORDER — HYDROMORPHONE HCL 1 MG/ML IJ SOLN
0.2500 mg | INTRAMUSCULAR | Status: DC | PRN
  Administered 2023-08-16: 0.5 mg via INTRAVENOUS

## 2023-08-16 MED ORDER — ASPIRIN 81 MG PO TBEC
81.0000 mg | DELAYED_RELEASE_TABLET | Freq: Every day | ORAL | Status: DC
  Administered 2023-08-17: 81 mg via ORAL
  Filled 2023-08-16: qty 1

## 2023-08-16 MED ORDER — SODIUM CHLORIDE 0.9% FLUSH
3.0000 mL | Freq: Two times a day (BID) | INTRAVENOUS | Status: DC
  Administered 2023-08-16 – 2023-08-17 (×2): 3 mL via INTRAVENOUS

## 2023-08-16 MED ORDER — SALINE SPRAY 0.65 % NA SOLN
1.0000 | Freq: Four times a day (QID) | NASAL | Status: DC | PRN
  Filled 2023-08-16: qty 44

## 2023-08-16 MED ORDER — OXYCODONE HCL 5 MG PO TABS
5.0000 mg | ORAL_TABLET | ORAL | Status: DC | PRN
  Administered 2023-08-16: 5 mg via ORAL
  Administered 2023-08-16 – 2023-08-17 (×3): 10 mg via ORAL
  Filled 2023-08-16: qty 2
  Filled 2023-08-16: qty 1
  Filled 2023-08-16 (×2): qty 2

## 2023-08-16 MED ORDER — ALUM & MAG HYDROXIDE-SIMETH 200-200-20 MG/5ML PO SUSP: 30.0000 mL | Freq: Four times a day (QID) | ORAL | Status: DC | PRN

## 2023-08-16 MED ORDER — ROCURONIUM BROMIDE 100 MG/10ML IV SOLN
INTRAVENOUS | Status: DC | PRN
  Administered 2023-08-16: 90 mg via INTRAVENOUS

## 2023-08-16 MED ORDER — DEXAMETHASONE SODIUM PHOSPHATE 10 MG/ML IJ SOLN
INTRAMUSCULAR | Status: DC | PRN
  Administered 2023-08-16: 10 mg via INTRAVENOUS

## 2023-08-16 MED ORDER — SODIUM CHLORIDE 0.9 % IR SOLN
Status: DC | PRN
  Administered 2023-08-16: 1000 mL

## 2023-08-16 MED ORDER — ONDANSETRON HCL 4 MG/2ML IJ SOLN
INTRAMUSCULAR | Status: AC
  Filled 2023-08-16: qty 2

## 2023-08-16 MED ORDER — KETOROLAC TROMETHAMINE 30 MG/ML IJ SOLN
INTRAMUSCULAR | Status: AC
  Filled 2023-08-16: qty 1

## 2023-08-16 MED ORDER — CALCIUM POLYCARBOPHIL 625 MG PO TABS
625.0000 mg | ORAL_TABLET | Freq: Two times a day (BID) | ORAL | Status: DC
  Administered 2023-08-16 – 2023-08-17 (×3): 625 mg via ORAL
  Filled 2023-08-16 (×3): qty 1

## 2023-08-16 MED ORDER — KETOROLAC TROMETHAMINE 30 MG/ML IJ SOLN
INTRAMUSCULAR | Status: DC | PRN
  Administered 2023-08-16: 30 mg via INTRAVENOUS

## 2023-08-16 MED ORDER — BUPIVACAINE-EPINEPHRINE 0.25% -1:200000 IJ SOLN
INTRAMUSCULAR | Status: DC | PRN
  Administered 2023-08-16: 50 mL

## 2023-08-16 MED ORDER — ROCURONIUM BROMIDE 10 MG/ML (PF) SYRINGE
PREFILLED_SYRINGE | INTRAVENOUS | Status: AC
  Filled 2023-08-16: qty 10

## 2023-08-16 MED ORDER — HYDROMORPHONE HCL 1 MG/ML IJ SOLN: 0.5000 mg | INTRAMUSCULAR | Status: DC | PRN

## 2023-08-16 MED ORDER — BISACODYL 10 MG RE SUPP: 10.0000 mg | Freq: Two times a day (BID) | RECTAL | Status: DC | PRN

## 2023-08-16 MED ORDER — TRAMADOL HCL 50 MG PO TABS: 50.0000 mg | ORAL_TABLET | Freq: Four times a day (QID) | ORAL | 0 refills | Status: AC | PRN

## 2023-08-16 MED ORDER — LIDOCAINE HCL (CARDIAC) PF 100 MG/5ML IV SOSY
PREFILLED_SYRINGE | INTRAVENOUS | Status: DC | PRN
  Administered 2023-08-16: 50 mg via INTRAVENOUS

## 2023-08-16 MED ORDER — AMISULPRIDE (ANTIEMETIC) 5 MG/2ML IV SOLN: 10.0000 mg | Freq: Once | INTRAVENOUS | Status: DC | PRN

## 2023-08-16 MED ORDER — SODIUM CHLORIDE 0.9 % IV SOLN
250.0000 mL | INTRAVENOUS | Status: DC | PRN
  Administered 2023-08-16: 250 mL via INTRAVENOUS

## 2023-08-16 MED ORDER — PROPOFOL 10 MG/ML IV BOLUS
INTRAVENOUS | Status: DC | PRN
  Administered 2023-08-16: 200 mg via INTRAVENOUS

## 2023-08-16 MED ORDER — METHOCARBAMOL 1000 MG/10ML IJ SOLN: 1000.0000 mg | Freq: Four times a day (QID) | INTRAMUSCULAR | Status: DC | PRN

## 2023-08-16 MED ORDER — ACETAMINOPHEN 500 MG PO TABS
1000.0000 mg | ORAL_TABLET | Freq: Four times a day (QID) | ORAL | Status: DC
  Administered 2023-08-16 – 2023-08-17 (×4): 1000 mg via ORAL
  Filled 2023-08-16 (×4): qty 2

## 2023-08-16 MED ORDER — TRAMADOL HCL 50 MG PO TABS
50.0000 mg | ORAL_TABLET | Freq: Four times a day (QID) | ORAL | Status: DC | PRN
  Administered 2023-08-16: 100 mg via ORAL
  Filled 2023-08-16: qty 2

## 2023-08-16 MED ORDER — LIDOCAINE HCL (PF) 2 % IJ SOLN
INTRAMUSCULAR | Status: AC
  Filled 2023-08-16: qty 5

## 2023-08-16 MED ORDER — MIDAZOLAM HCL 2 MG/2ML IJ SOLN
INTRAMUSCULAR | Status: AC
  Filled 2023-08-16: qty 2

## 2023-08-16 MED ORDER — ONDANSETRON HCL 4 MG/2ML IJ SOLN
INTRAMUSCULAR | Status: DC | PRN
  Administered 2023-08-16: 4 mg via INTRAVENOUS

## 2023-08-16 MED ORDER — SUGAMMADEX SODIUM 200 MG/2ML IV SOLN
INTRAVENOUS | Status: DC | PRN
  Administered 2023-08-16: 100 mg via INTRAVENOUS

## 2023-08-16 MED ORDER — VITAMIN D 25 MCG (1000 UNIT) PO TABS
10000.0000 [IU] | ORAL_TABLET | Freq: Every day | ORAL | Status: DC
  Administered 2023-08-17: 10000 [IU] via ORAL
  Filled 2023-08-16: qty 10

## 2023-08-16 MED ORDER — PHENOL 1.4 % MT LIQD: 2.0000 | OROMUCOSAL | Status: DC | PRN

## 2023-08-16 MED ORDER — MENTHOL 3 MG MT LOZG: 1.0000 | LOZENGE | OROMUCOSAL | Status: DC | PRN

## 2023-08-16 MED ORDER — DEXAMETHASONE SODIUM PHOSPHATE 10 MG/ML IJ SOLN
INTRAMUSCULAR | Status: AC
  Filled 2023-08-16: qty 1

## 2023-08-16 MED ORDER — BUPIVACAINE LIPOSOME 1.3 % IJ SUSP
INTRAMUSCULAR | Status: AC
  Filled 2023-08-16: qty 20

## 2023-08-16 SURGICAL SUPPLY — 40 items
APPLIER CLIP 5 13 M/L LIGAMAX5 (MISCELLANEOUS) IMPLANT
APPLIER CLIP ROT 10 11.4 M/L (STAPLE) IMPLANT
BAG COUNTER SPONGE SURGICOUNT (BAG) ×1 IMPLANT
CABLE HIGH FREQUENCY MONO STRZ (ELECTRODE) IMPLANT
CLIP APPLIE 5 13 M/L LIGAMAX5 (MISCELLANEOUS) IMPLANT
CLIP APPLIE ROT 10 11.4 M/L (STAPLE) IMPLANT
COVER MAYO STAND XLG (MISCELLANEOUS) ×1 IMPLANT
COVER SURGICAL LIGHT HANDLE (MISCELLANEOUS) ×1 IMPLANT
DRAPE 3/4 80X56 (DRAPES) IMPLANT
DRAPE C-ARM 42X120 X-RAY (DRAPES) ×1 IMPLANT
DRAPE UTILITY XL STRL (DRAPES) ×1 IMPLANT
DRAPE WARM FLUID 44X44 (DRAPES) ×1 IMPLANT
DRSG TEGADERM 2-3/8X2-3/4 SM (GAUZE/BANDAGES/DRESSINGS) ×2 IMPLANT
DRSG TEGADERM 4X4.75 (GAUZE/BANDAGES/DRESSINGS) IMPLANT
DRSG TEGADERM 6X8 (GAUZE/BANDAGES/DRESSINGS) ×1 IMPLANT
ELECT REM PT RETURN 15FT ADLT (MISCELLANEOUS) ×1 IMPLANT
ENDOLOOP SUT PDS II 0 18 (SUTURE) IMPLANT
GAUZE SPONGE 2X2 8PLY STRL LF (GAUZE/BANDAGES/DRESSINGS) IMPLANT
GLOVE ECLIPSE 8.0 STRL XLNG CF (GLOVE) ×1 IMPLANT
GLOVE INDICATOR 8.0 STRL GRN (GLOVE) ×1 IMPLANT
GOWN STRL REUS W/ TWL XL LVL3 (GOWN DISPOSABLE) ×2 IMPLANT
IRRIG SUCT STRYKERFLOW 2 WTIP (MISCELLANEOUS) ×1 IMPLANT
IRRIGATION SUCT STRKRFLW 2 WTP (MISCELLANEOUS) ×1 IMPLANT
KIT BASIN OR (CUSTOM PROCEDURE TRAY) ×1 IMPLANT
KIT TURNOVER KIT A (KITS) IMPLANT
PENCIL SMOKE EVACUATOR (MISCELLANEOUS) IMPLANT
POUCH RETRIEVAL ECOSAC 10 (ENDOMECHANICALS) ×1 IMPLANT
SCISSORS LAP 5X35 DISP (ENDOMECHANICALS) ×1 IMPLANT
SET CHOLANGIOGRAPH MIX (MISCELLANEOUS) ×1 IMPLANT
SET TUBE SMOKE EVAC HIGH FLOW (TUBING) ×1 IMPLANT
SLEEVE ADV FIXATION 5X100MM (TROCAR) ×1 IMPLANT
SPIKE FLUID TRANSFER (MISCELLANEOUS) ×1 IMPLANT
SUT MNCRL AB 4-0 PS2 18 (SUTURE) ×1 IMPLANT
SUT PDS AB 1 CT 36 (SUTURE) ×1 IMPLANT
SYR 20ML LL LF (SYRINGE) ×1 IMPLANT
TOWEL OR 17X26 10 PK STRL BLUE (TOWEL DISPOSABLE) ×1 IMPLANT
TRAY LAPAROSCOPIC (CUSTOM PROCEDURE TRAY) ×1 IMPLANT
TROCAR ADV FIXATION 12X100MM (TROCAR) ×1 IMPLANT
TROCAR ADV FIXATION 5X100MM (TROCAR) ×1 IMPLANT
TROCAR Z-THREAD OPTICAL 5X100M (TROCAR) ×1 IMPLANT

## 2023-08-16 NOTE — Transfer of Care (Signed)
Immediate Anesthesia Transfer of Care Note  Patient: Neil Henderson  Procedure(s) Performed: LAPAROSCOPIC CHOLECYSTECTOMY SINGLE SITE  Patient Location: PACU  Anesthesia Type:General  Level of Consciousness: awake and alert   Airway & Oxygen Therapy: Patient Spontanous Breathing and Patient connected to face mask oxygen  Post-op Assessment: Report given to RN and Post -op Vital signs reviewed and stable  Post vital signs: Reviewed and stable  Last Vitals:  Vitals Value Taken Time  BP 160/88 08/16/23 1042  Temp    Pulse 72 08/16/23 1044  Resp 13 08/16/23 1044  SpO2 98 % 08/16/23 1044  Vitals shown include unfiled device data.  Last Pain:  Vitals:   08/16/23 0819  TempSrc: Axillary  PainSc:       Patients Stated Pain Goal: 0 (08/15/23 0430)  Complications: No notable events documented.

## 2023-08-16 NOTE — Anesthesia Procedure Notes (Signed)
Procedure Name: Intubation Date/Time: 08/16/2023 9:26 AM  Performed by: Laurita Quint, CRNAPre-anesthesia Checklist: Patient identified, Emergency Drugs available, Suction available and Patient being monitored Patient Re-evaluated:Patient Re-evaluated prior to induction Oxygen Delivery Method: Circle System Utilized Preoxygenation: Pre-oxygenation with 100% oxygen Induction Type: IV induction Ventilation: Mask ventilation without difficulty Laryngoscope Size: Miller and 2 Grade View: Grade I Tube type: Oral Tube size: 7.5 mm Number of attempts: 1 Airway Equipment and Method: Stylet and Oral airway Placement Confirmation: ETT inserted through vocal cords under direct vision, positive ETCO2 and breath sounds checked- equal and bilateral Secured at: 24 cm Tube secured with: Tape Dental Injury: Teeth and Oropharynx as per pre-operative assessment

## 2023-08-16 NOTE — Op Note (Signed)
08/16/2023  PATIENT:  Neil Henderson  48 y.o. male  Patient Care Team: Pcp, No as PCP - General  PRE-OPERATIVE DIAGNOSIS:    Acute on Chronic Calculus Cholecystitis  POST-OPERATIVE DIAGNOSIS:   Acute on Chronic Calculus Cholecystitis  PROCEDURE:  SINGLE SITE Laparoscopic cholecystectomy (CPT code 16109)  SURGEON:  Ardeth Sportsman, MD, FACS.  ASSISTANT:  (n/a)   ANESTHESIA:  General endotracheal intubation anesthesia (GETA) and Regional TRANSVERSUS ABDOMINIS PLANE (TAP) nerve block -BILATERAL for perioperative & postoperative pain control at the level of the transverse abdominis & preperitoneal spaces along the flank at the anterior axillary line, from subcostal ridge to iliac crest under laparoscopic guidance provided with liposomal bupivacaine (Experel) 20mL mixed with 50 mL of bupivicaine 0.25% with epinephrine  Estimated Blood Loss (EBL):   Total I/O In: 850 [I.V.:800; IV Piggyback:50] Out: 10 [Blood:10].   (See anesthesia record)  Delay start of Pharmacological VTE agent (>24hrs) due to concerns of significant anemia, surgical blood loss, or risk of bleeding?:  no  DRAINS: (None)  SPECIMEN:  Gallbladder  DISPOSITION OF SPECIMEN:  Pathology  COUNTS:  Sponge, needle, & instrument counts CORRECT  PLAN OF CARE: Admit to inpatient   PATIENT DISPOSITION:  PACU - hemodynamically stable.  INDICATION: 48 year old male with right side abdominal pain somewhat atypical.  Some postprandial etiology.  Evaluation noted some gallbladder polyps but no definite cholecystitis.  However right renal cyst noted with some concern of inflammation.  Further workup evaluation consistent with simply a simple loculated renal cyst and not exactly infected.  Urology was skeptical with source of problem and urinalysis negative.  Patient had persistent postprandial problems and had more constant tenderness in hyperchromic suspicious for Murphy sign.  Given progression of symptoms without any other  etiology outside of gallbladder & also knowing he has gallbladder polyps, recommendation made for cholecystectomy  The anatomy & physiology of hepatobiliary & pancreatic function was discussed.  The pathophysiology of gallbladder dysfunction was discussed.  Natural history risks without surgery was discussed.   I feel the risks of no intervention will lead to serious problems that outweigh the operative risks; therefore, I recommended cholecystectomy to remove the pathology.  I explained laparoscopic techniques with possible need for an open approach.  Probable cholangiogram to evaluate the bilary tract was explained as well.    Risks such as bleeding, infection, abscess, leak, injury to other organs, need for further treatment, heart attack, death, and other risks were discussed.  I noted a good likelihood this will help address the problem.  Possibility that this will not correct all abdominal symptoms was explained.  Goals of post-operative recovery were discussed as well.  We will work to minimize complications.  An educational handout further explaining the pathology and treatment options was given as well.  Questions were answered.  The patient expresses understanding & wishes to proceed with surgery.  OR FINDINGS: Patient had inflamed gallbladder with chronic changes and edema consistent with acute on chronic cholecystitis.  Patient had a very narrow fibrotic cystic duct that was not too narrow to allow narrow cholangiogram  catheter to pass x 2 cholecystostomy's.  Cholangiography aborted.  Liver: normal  DESCRIPTION:   The patient was identified & brought in the operating room. The patient was positioned supine with arms tucked. SCDs were active during the entire case. The patient underwent general anesthesia without any difficulty.  The abdomen was prepped and draped in a sterile fashion. A Surgical Timeout confirmed our plan.  I made a transverse  curvilinear incision through the superior  umbilical fold.  I placed a 5mm long port through the supraumbilical fascia using a modified Hassan cutdown technique with umbilical stalk fascial countertraction. I began carbon dioxide insufflation.  No change in end tidal CO2 measurement.   Camera inspection revealed no injury. There were no adhesions to the anterior abdominal wall supraumbilically.  I proceeded to continue with single site technique. I placed a #5 port in left upper aspect of the wound. I placed a 5 mm atraumatic grasper in the right inferior aspect of the wound.  I turned attention to the right upper quadrant.  Operative findings as noted above.  The gallbladder fundus was elevated cephalad. I freed adhesions to the ventral surface of the gallbladder off carefully.  I freed the peritoneal coverings between the gallbladder and the liver on the posteriolateral and anteriomedial walls. I alternated between Harmonic & blunt Maryland dissection to help get a good critical view of the cystic artery and cystic duct.  did further dissection to free 80%of the gallbladder off the liver bed to get a good critical view of the infundibulum and cystic duct. I dissected out the cystic artery; and, after getting a good 360 view, ligated the anterior & posterior branches of the cystic artery close on the infundibulum using the Harmonic ultrasonic dissection.  I skeletonized the cystic duct.  I placed a clip on the infundibulum. I did a partial cystic duct-otomy and ensured patency. I placed a 5 [place] cholangiocatheter through a puncture site at the right subcostal ridge of the abdominal wall and directed it into the cystic duct.  He could only pass 5 mm.  I did dilate up but still would not pass consistently.  I further skeletonized more proximally to the cystic duct/common bile duct junction.  Did another cystic tractotomy slightly proximal to a presumed fibrotic valve.  Again seem to be viable but it would not pass more than 4 mm.  There was a little  cystic duct left to try another attempt at cholecystotomy tube.  Therefore I aborted on cholangiogram.    I removed the cholangiocatheter. I placed clips on the cystic duct x4.  I completed cystic duct transection. I freed the gallbladder from its remaining attachments to the liver. I ensured hemostasis on the gallbladder fossa of the liver and elsewhere. I inspected the rest of the abdomen & detected no injury nor bleeding elsewhere.  I placed the gallbladder inside and EcoSac and removed the gallbladder out the supraumbilical fascia. I closed the fascia transversely using #1 PDS interrupted stitches. I closed the skin using 4-0 monocryl stitch.  Sterile dressing was applied. The patient was extubated & arrived in the PACU in stable condition..  I had discussed postoperative care with the patient in the holding area. I discussed operative findings, updated the patient's status, discussed probable steps to recovery, and gave postoperative recommendations to the patient's spouse, Aram Beecham  Recommendations were made.  Questions were answered.  She expressed understanding & appreciation.  Ardeth Sportsman, M.D., F.A.C.S. Gastrointestinal and Minimally Invasive Surgery [facility] [address], [address] [place], [place] [address] [phone] Main / Paging  08/16/2023 10:47 AM

## 2023-08-16 NOTE — Anesthesia Postprocedure Evaluation (Signed)
Anesthesia Post Note  Patient: Neil Henderson  Procedure(s) Performed: LAPAROSCOPIC CHOLECYSTECTOMY SINGLE SITE     Patient location during evaluation: PACU Anesthesia Type: General Level of consciousness: awake and alert Pain management: pain level controlled Vital Signs Assessment: post-procedure vital signs reviewed and stable Respiratory status: spontaneous breathing, nonlabored ventilation and respiratory function stable Cardiovascular status: blood pressure returned to baseline and stable Postop Assessment: no apparent nausea or vomiting Anesthetic complications: no   No notable events documented.  Last Vitals:  Vitals:   08/16/23 1115 08/16/23 1130  BP: (!) 149/87 (!) 144/92  Pulse: 69 (!) 58  Resp: 15 17  Temp: (!) 36.4 C (!) 36.4 C  SpO2: 97% 97%    Last Pain:  Vitals:   08/16/23 1130  TempSrc: Oral  PainSc: 3                  Lowella Curb

## 2023-08-16 NOTE — Progress Notes (Addendum)
   08/16/23 1316  TOC Brief Assessment  Insurance and Status Reviewed  Patient has primary care physician No  Home environment has been reviewed home with spouse  Prior level of function: independent  Prior/Current Home Services No current home services  Social Drivers of Health Review SDOH reviewed no interventions necessary  Readmission risk has been reviewed Yes  Transition of care needs no transition of care needs at this time    Community resources for housing and utility assist added to AVS.

## 2023-08-16 NOTE — Anesthesia Preprocedure Evaluation (Signed)
Anesthesia Evaluation  Patient identified by MRN, date of birth, ID band Patient awake    Reviewed: Allergy & Precautions, H&P , NPO status , Patient's Chart, lab work & pertinent test results  Airway Mallampati: II  TM Distance: >3 FB Neck ROM: Full    Dental no notable dental hx.    Pulmonary neg pulmonary ROS, Patient abstained from smoking., former smoker   Pulmonary exam normal breath sounds clear to auscultation       Cardiovascular hypertension, Pt. on medications negative cardio ROS Normal cardiovascular exam Rhythm:Regular Rate:Normal     Neuro/Psych negative neurological ROS  negative psych ROS   GI/Hepatic negative GI ROS, Neg liver ROS,,,  Endo/Other  negative endocrine ROS    Renal/GU negative Renal ROS  negative genitourinary   Musculoskeletal negative musculoskeletal ROS (+)    Abdominal  (+) + obese  Peds negative pediatric ROS (+)  Hematology negative hematology ROS (+)   Anesthesia Other Findings   Reproductive/Obstetrics negative OB ROS                             Anesthesia Physical Anesthesia Plan  ASA: 2  Anesthesia Plan: General   Post-op Pain Management: Tylenol PO (pre-op)*   Induction: Intravenous  PONV Risk Score and Plan: 2 and Ondansetron, Midazolam and Treatment may vary due to age or medical condition  Airway Management Planned: Oral ETT  Additional Equipment:   Intra-op Plan:   Post-operative Plan: Extubation in OR  Informed Consent: I have reviewed the patients History and Physical, chart, labs and discussed the procedure including the risks, benefits and alternatives for the proposed anesthesia with the patient or authorized representative who has indicated his/her understanding and acceptance.     Dental advisory given  Plan Discussed with: CRNA  Anesthesia Plan Comments:        Anesthesia Quick Evaluation

## 2023-08-16 NOTE — Discharge Instructions (Signed)
################################################################  LAPAROSCOPIC SURGERY: POST OP INSTRUCTIONS  ######################################################################  EAT Gradually transition to a high fiber diet with a fiber supplement over the next few weeks after discharge.  Start with a pureed / full liquid diet (see below)  WALK Walk an hour a day.  Control your pain to do that.    CONTROL PAIN Control pain so that you can walk, sleep, tolerate sneezing/coughing, go up/down stairs.  HAVE A BOWEL MOVEMENT DAILY Keep your bowels regular to avoid problems.  OK to try a laxative to override constipation.  OK to use an antidairrheal to slow down diarrhea.  Call if not better after 2 tries  CALL IF YOU HAVE PROBLEMS/CONCERNS Call if you are still struggling despite following these instructions. Call if you have concerns not answered by these instructions  ######################################################################    DIET: Follow a light bland diet & liquids the first 24 hours after arrival home, such as soup, liquids, starches, etc.  Be sure to drink plenty of fluids.  Quickly advance to a usual solid diet within a few days.  Avoid fast food or heavy meals as your are more likely to get nauseated or have irregular bowels.  A low-fat, high-fiber diet for the rest of your life is ideal.  Take your usually prescribed home medications unless otherwise directed. Blood thinners:  You can restart any strong blood thinners after the second postoperative day  for example: COUMADIN (warfarin), XERELTO (rivaroxaban), ELIQUIS (apixaban), PLAVIX (clopidigrel), BRILINTA (ticagrelor), EFFIENT (prasugrel), PRADAXA (dabigatran), etc  Continue aspirin before & after surgery..     Some oozing/bleeding the first 1-2 weeks is common but should taper down & be small volume.    If you are passing many large clots or having uncontrolling bleeding, call your surgeon  PAIN  CONTROL: Pain is best controlled by a usual combination of three different methods TOGETHER: Ice/Heat Over the counter pain medication Prescription pain medication Most patients will experience some swelling and bruising around the incisions.  Ice packs or heating pads (30-60 minutes up to 6 times a day) will help. Use ice for the first few days to help decrease swelling and bruising, then switch to heat to help relax tight/sore spots and speed recovery.  Some people prefer to use ice alone, heat alone, alternating between ice & heat.  Experiment to what works for you.  Swelling and bruising can take several weeks to resolve.   It is helpful to take an over-the-counter pain medication regularly for the first few weeks.  Choose one of the following that works best for you: Naproxen (Aleve, etc)  Two 220mg  tabs twice a day Ibuprofen (Advil, etc) Three 200mg  tabs four times a day (every meal & bedtime) Acetaminophen (Tylenol, etc) 500-650mg  four times a day (every meal & bedtime) A  prescription for pain medication (such as oxycodone, hydrocodone, tramadol, gabapentin, methocarbamol, etc) should be given to you upon discharge.  Take your pain medication as prescribed.  If you are having problems/concerns with the prescription medicine (does not control pain, nausea, vomiting, rash, itching, etc), please call us [phone] to see if we need to switch you to a different pain medicine that will work better for you and/or control your side effect better. If you need a refill on your pain medication, please give [place] 48 hour notice.  contact your pharmacy.  They will contact our office to request authorization. Prescriptions will not be filled after 5 pm or on week-ends  AVOID GETTING CONSTIPATED.   a.  Between the surgery and the pain medications, it is common to experience some constipation.  b.  Drink plenty of liquids c   ake a fiber supplement 2 times day (such as Metamucil, Citrucel, FiberCon,  MiraLax, etc) to have a bowel movement every day. d.  If you have not had a BM by 2 days after surgery: -drink liquids only until you have a bowel movement - take MiraLAX 2 doses every 2 hours until you have a bowel movement   Watch out for diarrhea.   If you have many loose bowel movements, simplify your diet to bland foods & liquids for a few days.   Stop any stool softeners and decrease your fiber supplement.   Switching to mild anti-diarrheal medications (Kayopectate, Pepto Bismol) can help.   If this worsens or does not improve, please call us.  Wash / shower every day.  You may shower over the dressings as they are waterproof.  Continue to shower over incision(s) after the dressing is off.  It is good for closed incisions and even open wounds to be washed every day.  Shower every day.  Short baths are fine.  Wash the incisions and wounds clean with soap & water.    You may leave closed incisions open to air if it is dry.   You may cover the incision with clean gauze & replace it after your daily shower for comfort.  TEGADERM:  You have clear gauze band-aid dressings over your closed incision(s).  Remove the dressings 2 days after surgery = 2/22 Saturday.    ACTIVITIES as tolerated:   You may resume regular (light) daily activities beginning the next day--such as daily self-care, walking, climbing stairs--gradually increasing activities as tolerated.  If you can walk 30 minutes without difficulty, it is safe to try more intense activity such as jogging, treadmill, bicycling, low-impact aerobics, swimming, etc. Save the most intensive and strenuous activity for last such as sit-ups, heavy lifting, contact sports, etc  Refrain from any heavy lifting or straining until you are off narcotics for pain control.   DO NOT PUSH THROUGH PAIN.  Let pain be your guide: If it hurts to do something, don't do it.  Pain is your body warning you to avoid that activity for another week until the pain goes  down. You may drive when you are no longer taking prescription pain medication, you can comfortably wear a seatbelt, and you can safely maneuver your car and apply brakes. You may have sexual intercourse when it is comfortable.  FOLLOW UP in our office Please call [facility] at [phone] to set up an appointment to see your surgeon in the office for a follow-up appointment approximately 2-3 weeks after your surgery. Make sure that you call for this appointment the day you arrive home to insure a convenient appointment time.  10. IF YOU HAVE DISABILITY OR FAMILY LEAVE FORMS, BRING THEM TO THE OFFICE FOR PROCESSING.  DO NOT GIVE THEM TO YOUR DOCTOR.   WHEN TO CALL us [phone]: Poor pain control Reactions / problems with new medications (rash/itching, nausea, etc)  Fever over 101.5 F (38.5 C) Inability to urinate Nausea and/or vomiting Worsening swelling or bruising Continued bleeding from incision. Increased pain, redness, or drainage from the incision   The clinic staff is available to answer your questions during regular business hours (8:30am-5pm).  Please don't hesitate to call and ask to speak to one of our nurses for clinical concerns.   If you have  a medical emergency, go to the nearest emergency room or call 911.  A surgeon from [facility] is always on call at the [facility], [place] [address], [place], [place]  [address] ? MAIN: [phone] ? TOLL FREE: [phone] ?  FAX [phone] [email or web address]  ##############################################################

## 2023-08-16 NOTE — Interval H&P Note (Signed)
History and Physical Interval Note:  08/16/2023 8:42 AM  Neil Henderson  has presented today for surgery, with the diagnosis of CHOLECYSITIS.  The various methods of treatment have been discussed with the patient and family. After consideration of risks, benefits and other options for treatment, the patient has consented to  Procedure(s): LAPAROSCOPIC CHOLECYSTECTOMY WITH INTRAOPERATIVE CHOLANGIOGRAM (N/A) as a surgical intervention.  The patient's history has been reviewed, patient examined, no change in status, stable for surgery.  I have reviewed the patient's chart and labs.  Questions were answered to the patient's satisfaction.    The anatomy & physiology of hepatobiliary & pancreatic function was discussed.  The pathophysiology of gallbladder dysfunction was discussed.  Natural history risks without surgery was discussed.   I feel the risks of no intervention will lead to serious problems that outweigh the operative risks; therefore, I recommended cholecystectomy to remove the pathology.  I explained laparoscopic techniques with possible need for an open approach.  Probable cholangiogram to evaluate the bilary tract was explained as well.    Risks such as bleeding, infection, diarrhea and other bowel changes, abscess, leak, injury to other organs, need for repair of tissues / organs, need for further treatment, stroke, heart attack, death, and other risks were discussed.  I noted a good likelihood this will help address the problem, but there is a chance it may not help.  Possibility that this will not correct all abdominal symptoms was explained.  Goals of post-operative recovery were discussed as well.  We will work to minimize complications.  An educational handout further explaining the pathology and treatment options was given as well.  Questions were answered.  The patient expresses understanding & wishes to proceed with surgery.   I have re-reviewed the the patient's records, history,  medications, and allergies.  I have re-examined the patient.  I again discussed intraoperative plans and goals of post-operative recovery.  The patient agrees to proceed.  Neil Henderson  11-Nov-1975 [id number]  Patient Care Team: Pcp, No as PCP - General  Patient Active Problem List   Diagnosis Date Noted   Renal abscess 08/14/2023   Cholecystitis 08/13/2023   Pyelonephritis 08/13/2023   Morbid obesity (HCC) 12/19/2021   Smoking 11/05/2019   Medication management 12/24/2013   Essential hypertension 07/02/2013   Hyperlipidemia 07/02/2013   Vitamin D deficiency 07/02/2013   Other abnormal glucose    Testosterone Deficiency    Obesity (BMI 30.0-34.9)     Past Medical History:  Diagnosis Date   Hyperlipidemia    Hypertension    Hypogonadism male    Obesity    Prediabetes    Vitamin D deficiency     Past Surgical History:  Procedure Laterality Date   CERVICAL FUSION      Social History   Socioeconomic History   Marital status: Married    Spouse name: Not on file   Number of children: Not on file   Years of education: Not on file   Highest education level: Not on file  Occupational History   Not on file  Tobacco Use   Smoking status: Former    Current packs/day: 1.00    Average packs/day: 1 pack/day for 36.1 years (36.1 ttl pk-yrs)    Types: Cigarettes    Start date: 1989   Smokeless tobacco: Never  Substance and Sexual Activity   Alcohol use: Yes    Alcohol/week: 4.0 standard drinks of alcohol    Types: 4 Standard drinks or equivalent per week  Comment: moderate   Drug use: Not Currently    Frequency: 2.0 times per week    Types: Marijuana   Sexual activity: Not on file  Other Topics Concern   Not on file  Social History Narrative   Not on file   Social Drivers of Health   Financial Resource Strain: Not on file  Food Insecurity: No Food Insecurity (08/13/2023)   Hunger Vital Sign    Worried About Running Out of Food in the Last Year: Never true     Ran Out of Food in the Last Year: Never true  Transportation Needs: No Transportation Needs (08/13/2023)   PRAPARE - Administrator, Civil Service (Medical): No    Lack of Transportation (Non-Medical): No  Physical Activity: Not on file  Stress: Not on file  Social Connections: Moderately Isolated (08/13/2023)   Social Connection and Isolation Panel [NHANES]    Frequency of Communication with Friends and Family: More than three times a week    Frequency of Social Gatherings with Friends and Family: Once a week    Attends Religious Services: Never    Database administrator or Organizations: No    Attends Banker Meetings: Never    Marital Status: Married  Catering manager Violence: Not At Risk (08/13/2023)   Humiliation, Afraid, Rape, and Kick questionnaire    Fear of Current or Ex-Partner: No    Emotionally Abused: No    Physically Abused: No    Sexually Abused: No    Family History  Problem Relation Age of Onset   Hyperlipidemia Mother    Hyperlipidemia Father    Cancer Paternal Grandfather        Lung    Medications Prior to Admission  Medication Sig Dispense Refill Last Dose/Taking   acetaminophen (TYLENOL) 325 MG tablet Take 650 mg by mouth every 6 (six) hours as needed for mild pain (pain score 1-3).   Taking As Needed   aspirin EC 81 MG tablet Take 81 mg by mouth daily.   08/12/2023   atorvastatin (LIPITOR) 80 MG tablet Take  1 tablet  Daily  for Cholesterol (Patient taking differently: Take 40 mg by mouth daily. Take  1 tablet  Daily  for Cholesterol) 90 tablet 3 08/12/2023   cholecalciferol (VITAMIN D) 1000 units tablet Take 10,000 Units by mouth daily.   08/12/2023   tadalafil (CIALIS) 20 MG tablet Take 1/2 to 1 tablet every 2 to 3 days as needed for XXXX (Patient taking differently: Take 10-20 mg by mouth as needed for erectile dysfunction. Take 1/2 to 1 tablet every 2 to 3 days as needed for XXXX) 30 tablet 3     Current Facility-Administered  Medications  Medication Dose Route Frequency Provider Last Rate Last Admin   [MAR Hold] acetaminophen (TYLENOL) tablet 650 mg  650 mg Oral Q6H PRN Alan Mulder, MD       Or   Mitzi Hansen Hold] acetaminophen (TYLENOL) suppository 650 mg  650 mg Rectal Q6H PRN Alan Mulder, MD       Mitzi Hansen Hold] atorvastatin (LIPITOR) tablet 80 mg  80 mg Oral Daily Alan Mulder, MD   80 mg at 08/15/23 0914   bupivacaine liposome (EXPAREL) 1.3 % injection 266 mg  20 mL Infiltration On Call to OR Karie Soda, MD       [facility] Hold] cefTRIAXone (ROCEPHIN) 2 g in sodium chloride 0.9 % 100 mL IVPB  2 g Intravenous Q24H Alan Mulder, MD 200 mL/hr at 08/15/23  2328 2 g at 08/15/23 2328   Chlorhexidine Gluconate Cloth 2 % PADS 6 each  6 each Topical Once Karie Soda, MD       [facility] Hold] HYDROmorphone (DILAUDID) injection 1 mg  1 mg Intravenous Q2H PRN Marinda Elk, MD       lactated ringers infusion   Intravenous Continuous Alan Mulder, MD 125 mL/hr at 08/16/23 0818 New Bag at 08/16/23 0818   [MAR Hold] ondansetron (ZOFRAN) tablet 4 mg  4 mg Oral Q6H PRN Alan Mulder, MD       Or   Mitzi Hansen Hold] ondansetron [facility]) injection 4 mg  4 mg Intravenous Q6H PRN Alan Mulder, MD   4 mg at 08/15/23 2258   [MAR Hold] oxyCODONE-acetaminophen (PERCOCET/ROXICET) 5-325 MG per tablet 1 tablet  1 tablet Oral Q4H PRN Marinda Elk, MD   1 tablet at 08/15/23 2300   polyethylene glycol (MIRALAX / GLYCOLAX) packet 17 g  17 g Oral BID Marinda Elk, MD   17 g at 08/15/23 1229     No Known Allergies  BP (!) 148/90 (BP Location: Left Arm)   Pulse 64   Temp 97.6 F (36.4 C) (Axillary)   Resp 16   Ht 5\' 9"  (1.753 m)   Wt 96.3 kg   SpO2 97%   BMI 31.35 kg/m   Labs: Results for orders placed or performed during the hospital encounter of 08/13/23 (from the past 48 hours)  Protime-INR     Status: None   Collection Time: 08/14/23 10:13 AM  Result Value Ref Range   Prothrombin Time 14.5 11.4 - 15.2  seconds   INR 1.1 0.8 - 1.2    Comment: (NOTE) INR goal varies based on device and disease states. Performed at [facility], [address] [place], [place] [address]   Hepatic function panel     Status: Abnormal   Collection Time: 08/15/23  5:13 AM  Result Value Ref Range   Total Protein 7.3 6.5 - 8.1 g/dL   Albumin 3.9 3.5 - 5.0 g/dL   AST 14 (L) 15 - 41 U/L   ALT 14 0 - 44 U/L   Alkaline Phosphatase 76 38 - 126 U/L   Total Bilirubin 1.5 (H) 0.0 - 1.2 mg/dL   Bilirubin, Direct 0.3 (H) 0.0 - 0.2 mg/dL   Indirect Bilirubin 1.2 (H) 0.3 - 0.9 mg/dL    Comment: Performed at [facility], [address] [place], [place] [address]  CBC     Status: None   Collection Time: 08/15/23  5:13 AM  Result Value Ref Range   WBC 8.8 4.0 - 10.5 K/uL   RBC 5.27 4.22 - 5.81 MIL/uL   Hemoglobin 16.1 13.0 - 17.0 g/dL   HCT 13.2 44.0 - 10.2 %   MCV 92.0 80.0 - 100.0 fL   MCH 30.6 26.0 - 34.0 pg   MCHC 33.2 30.0 - 36.0 g/dL   RDW 72.5 36.6 - 44.0 %   Platelets 257 150 - 400 K/uL   nRBC 0.0 0.0 - 0.2 %    Comment: Performed at [facility], [address], [place], [place] [address]  CBC     Status: None   Collection Time: 08/16/23  5:05 AM  Result Value Ref Range   WBC 5.7 4.0 - 10.5 K/uL   RBC 4.90 4.22 - 5.81 MIL/uL   Hemoglobin 14.8 13.0 - 17.0 g/dL   HCT 59.5 63.8 - 75.6 %  MCV 89.0 80.0 - 100.0 fL   MCH 30.2 26.0 - 34.0 pg   MCHC 33.9 30.0 - 36.0 g/dL   RDW 16.1 09.6 - 04.5 %   Platelets 243 150 - 400 K/uL   nRBC 0.0 0.0 - 0.2 %    Comment: Performed at [facility], [address], [place], [place] [address]  Comprehensive metabolic panel     Status: Abnormal   Collection Time: 08/16/23  5:05 AM  Result Value Ref Range   Sodium 140 135 - 145 mmol/L   Potassium 4.3 3.5 - 5.1 mmol/L   Chloride 104 98 - 111 mmol/L   CO2 26 22 - 32 mmol/L   Glucose, Bld 78 70 - 99 mg/dL    Comment: Glucose reference  range applies only to samples taken after fasting for at least 8 hours.   BUN 10 6 - 20 mg/dL   Creatinine, Ser 1.91 0.61 - 1.24 mg/dL   Calcium 9.0 8.9 - 47.8 mg/dL   Total Protein 6.5 6.5 - 8.1 g/dL   Albumin 3.6 3.5 - 5.0 g/dL   AST 13 (L) 15 - 41 U/L   ALT 13 0 - 44 U/L   Alkaline Phosphatase 65 38 - 126 U/L   Total Bilirubin 0.9 0.0 - 1.2 mg/dL   GFR, Estimated >29 >56 mL/min    Comment: (NOTE) Calculated using the CKD-EPI Creatinine Equation (2021)    Anion gap 10 5 - 15    Comment: Performed at [facility], [address] [place], [place] [address]    Imaging / Studies: MR ABDOMEN W WO CONTRAST Result Date: 08/14/2023 CLINICAL DATA:  Renal mass/cyst, indeterminate. Patient presented with right-sided abdominal pain and had an indeterminate right renal lesion on CT. EXAM: MRI ABDOMEN WITHOUT AND WITH CONTRAST TECHNIQUE: Multiplanar multisequence MR imaging of the abdomen was performed both before and after the administration of intravenous contrast. CONTRAST:  10mL GADAVIST GADOBUTROL 1 MMOL/ML IV SOLN COMPARISON:  Right upper quadrant abdominal ultrasound and abdominopelvic CT 08/13/2023. FINDINGS: Lower chest:  The visualized lower chest appears unremarkable. Hepatobiliary: The liver has a non cirrhotic morphology without significant steatosis. No focal liver lesion or suspicious enhancement. As seen on ultrasound, there are multiple small gallbladder polyps. No significant gallbladder wall thickening, surrounding inflammation or biliary ductal dilatation. Pancreas: Unremarkable. No pancreatic ductal dilatation or surrounding inflammatory changes. Spleen: Normal in size without focal abnormality. Adrenals/Urinary Tract: Both adrenal glands appear normal. The left kidney appears normal. As seen on CT, there is a bilobed cystic lesion in the upper interpolar region of the right kidney which likely represents 2 adjacent complex cysts, measuring 2.7 x 2.6 cm, and 3.3 x 3.3  cm, respectively. Both of these lesions demonstrate heterogeneous T1 and T2 signal. Following contrast, there is no wall thickening or abnormal associated enhancement. There is persistent asymmetric perinephric soft tissue stranding on the right with fluid tracking into the right pericolic gutter. This suggests possible superimposed infection. No hydronephrosis or perinephric soft tissue stranding on the left. Bladder not imaged. Stomach/Bowel: The stomach appears unremarkable for its degree of distension. No evidence of bowel wall thickening, distention or surrounding inflammatory change.Mildly prominent stool in the right colon, unchanged. Vascular/Lymphatic: There are no enlarged abdominal lymph nodes. No evidence of aneurysm or large vessel occlusion. The renal veins and IVC are patent. Other: As above, asymmetric perinephric soft tissue stranding on the right with ill-defined fluid tracking into the right pericolic gutter. No generalized ascites or drainable  fluid collection. Musculoskeletal: No acute or significant osseous findings. No evidence of discitis or osteomyelitis. IMPRESSION: 1. The indeterminate right renal lesion seen on CT represents two adjacent complex cysts (Bosniak 2). No evidence of solid renal mass or abnormal enhancement. Persistent associated asymmetric perinephric soft tissue stranding on the right with ill-defined fluid tracking into the right pericolic gutter, suggesting superimposed infection. Correlate clinically and with urinalysis. 2. No hydronephrosis.  The left kidney appears unremarkable. 3. Multiple small gallbladder polyps. No evidence of cholecystitis or biliary ductal dilatation. 4. No other significant findings. Electronically Signed   By: Carey Bullocks M.D.   On: 08/14/2023 18:52   CT ABDOMEN PELVIS W CONTRAST Result Date: 08/13/2023 CLINICAL DATA:  Right-sided abdominal pain. EXAM: CT ABDOMEN AND PELVIS WITH CONTRAST TECHNIQUE: Multidetector CT imaging of the abdomen  and pelvis was performed using the standard protocol following bolus administration of intravenous contrast. RADIATION DOSE REDUCTION: This exam was performed according to the departmental dose-optimization program which includes automated exposure control, adjustment of the mA and/or kV according to patient size and/or use of iterative reconstruction technique. CONTRAST:  OMNIPAQUE IOHEXOL 300 MG/ML  SOLN COMPARISON:  Ultrasound dated 08/13/2023. FINDINGS: Lower chest: The visualized lung bases are clear. No intra-abdominal free air or free fluid. Hepatobiliary: Fatty liver. No biliary dilatation. No calcified gallstones. Tiny nodular densities in the gallbladder better evaluated on the ultrasound and may represent polyps. No pericholecystic fluid. Pancreas: Unremarkable. No pancreatic ductal dilatation or surrounding inflammatory changes. Spleen: Normal in size without focal abnormality. Adrenals/Urinary Tract: The adrenal glands are unremarkable. There is a 5.7 x 3.3 cm bilobed and partially exophytic cyst arising from the upper pole of the right kidney. There is areas of higher attenuation within this cyst which may represent hemorrhage, proteinaceous debris, or less likely enhancing tissue. There is stranding and edema of the perinephric fat adjacent to the cyst. This may represent an infected cyst or an abscess. Clinical correlation and follow-up to resolution recommended to exclude malignancy. The left kidney is unremarkable. The visualized ureters and urinary bladder appear unremarkable. Stomach/Bowel: There is no bowel obstruction or active inflammation. The appendix is normal. Vascular/Lymphatic: Mild aortoiliac atherosclerotic disease. The IVC is unremarkable. No portal venous gas. There is no adenopathy. Reproductive: The prostate and seminal vesicles are grossly unremarkable. No pelvic mass. Other: None Musculoskeletal: Right L5 pars defect. No listhesis. Mild degenerative changes of spine. No  acute osseous pathology. IMPRESSION: 1. Partially exophytic right renal upper pole infected cyst versus abscess. Follow-up to resolution recommended to exclude malignancy. 2. No bowel obstruction. Normal appendix. 3. Fatty liver. 4.  Aortic Atherosclerosis (ICD10-I70.0). Electronically Signed   By: Elgie Collard M.D.   On: 08/13/2023 18:05   US Abdomen Limited RUQ (LIVER/GB) Result Date: 08/13/2023 CLINICAL DATA:  [id number] RUQ abdominal pain [id number] EXAM: ULTRASOUND ABDOMEN LIMITED RIGHT UPPER QUADRANT COMPARISON:  None Available. FINDINGS: Gallbladder: Physiologically distended. There are multiple sessile gallbladder polyps with largest measuring up to 5 x 7 mm. No associated focal wall thickening. However, the gallbladder wall is diffusely thickened measuring up to 3.5 mm. No pericholecystic free fluid. Sonographic Murphy's sign was positive as per the technologist. Common bile duct: Diameter: Less than 4 mm.  No intrahepatic bile duct dilation. Liver: No focal lesion identified. Within normal limits in parenchymal echogenicity. Portal vein is patent on color Doppler imaging with normal direction of blood flow towards the liver. Other: None. IMPRESSION: 1. There are multiple sessile gallbladder polyps, which can be characterized as low risk  polyps. Largest polyp measured up to 5 x 7 mm. Follow-up ultrasound is recommended in 1 year to document stability. Please see gallbladder follow-up reference below. 2. Partially distended gallbladder and no pericholecystic free fluid; however, exhibiting mild diffuse wall thickening and technologist noted positive sonographic Murphy's sign. Findings are equivocal for acute cholecystitis but considered less likely. Correlate clinically to determine the need for additional imaging. 3. Otherwise unremarkable exam. Management of Incidentally Detected Gallbladder Polyps: Society of Radiologists in Ultrasound Consensus Conference Recommendations (published online December 28, 2020):  [email or web address] Extremely low-risk polyps, i.e. pedunculated ball-on-the-wall or thin stalk: <9 mm: no follow-up 10-14 mm: follow-up at 6, 12 and 24 months >15 mm: surgical consult Low-risk polyps, i.e. pedunculated with a thick or wide stalk, or sessile: ?6 mm: no follow-up 7-9 mm follow-up ultrasound at 12 months 10-14 mm: follow-up ultrasound at 6, 12, 24, and 36 months vs surgical consult >15 mm: surgical consult Intermediate risk: focal wall-thickening >70mm adjacent to polyp: <6 mm: follow-up at 6, 12, 24, 36 months vs surgical consult >7 mm: surgical consult Electronically Signed   By: Jules Schick M.D.   On: 08/13/2023 11:12     .Ardeth Sportsman, M.D., F.A.C.S. [facility] [address] [place], [place] [address] [phone] [address] / Paging  08/16/2023 8:42 AM    Ardeth Sportsman

## 2023-08-17 ENCOUNTER — Encounter (HOSPITAL_COMMUNITY): Payer: Self-pay | Admitting: Surgery

## 2023-08-17 DIAGNOSIS — K812 Acute cholecystitis with chronic cholecystitis: Secondary | ICD-10-CM | POA: Diagnosis not present

## 2023-08-17 LAB — CBC
HCT: 41.1 % (ref 39.0–52.0)
Hemoglobin: 14.1 g/dL (ref 13.0–17.0)
MCH: 30.4 pg (ref 26.0–34.0)
MCHC: 34.3 g/dL (ref 30.0–36.0)
MCV: 88.6 fL (ref 80.0–100.0)
Platelets: 268 10*3/uL (ref 150–400)
RBC: 4.64 MIL/uL (ref 4.22–5.81)
RDW: 11.7 % (ref 11.5–15.5)
WBC: 13.5 10*3/uL — ABNORMAL HIGH (ref 4.0–10.5)
nRBC: 0 % (ref 0.0–0.2)

## 2023-08-17 LAB — COMPREHENSIVE METABOLIC PANEL
ALT: 27 U/L (ref 0–44)
AST: 27 U/L (ref 15–41)
Albumin: 3.5 g/dL (ref 3.5–5.0)
Alkaline Phosphatase: 63 U/L (ref 38–126)
Anion gap: 10 (ref 5–15)
BUN: 17 mg/dL (ref 6–20)
CO2: 24 mmol/L (ref 22–32)
Calcium: 8.9 mg/dL (ref 8.9–10.3)
Chloride: 100 mmol/L (ref 98–111)
Creatinine, Ser: 0.93 mg/dL (ref 0.61–1.24)
GFR, Estimated: >60 mL/min (ref >60)
Glucose, Bld: 114 mg/dL — ABNORMAL HIGH (ref 70–99)
Potassium: 4 mmol/L (ref 3.5–5.1)
Sodium: 134 mmol/L — ABNORMAL LOW (ref 135–145)
Total Bilirubin: 0.7 mg/dL (ref 0.0–1.2)
Total Protein: 6.6 g/dL (ref 6.5–8.1)

## 2023-08-17 LAB — SURGICAL PATHOLOGY

## 2023-08-17 MED ORDER — NICOTINE 21 MG/24HR TD PT24: 21.0000 mg | MEDICATED_PATCH | Freq: Every day | TRANSDERMAL | 0 refills | Status: AC

## 2023-08-17 NOTE — Progress Notes (Signed)
 Discharge instructions discussed with patient, verbalized agreement and understanding

## 2023-08-17 NOTE — Discharge Summary (Addendum)
 Physician Discharge Summary  Neil Henderson ZOX:[id number] DOB: 22-Oct-1975 DOA: 08/13/2023  PCP: Oneita Hurt, No  Admit date: 08/13/2023 Discharge date: 08/17/2023  Admitted From: Home Disposition: Home  Recommendations for Outpatient Follow-up:  Follow up with PCP in 1-2 weeks Please obtain BMP/CBC in one week Please follow up with general surgery  Home Health: None Equipment/Devices: None Discharge Condition: Stable CODE STATUS: Full Diet recommendation: Cardiac Brief/Interim Summary:  48 y.o. male past medical history significant for essential hypertension comes in to the ED for abdominal pain, mainly suprapubic abdominal tenderness that moved to his right flank that started 24 hours prior to admission in the ED was found to have a white count of 11 hemoglobin of 16 UA was negative for infection abdominal ultrasound was unremarkable CT scan of the abdomen and pelvis showed an infected cyst versus right renal abscess.   Assessment and plan Right complex renal cyst -MRI shows The indeterminate right renal lesion seen on CT represents two adjacent complex cysts (Bosniak 2). No evidence of solid renal mass or abnormal enhancement. Persistent associated asymmetric perinephric soft tissue stranding on the right with ill-defined fluid tracking into the right pericolic gutter, suggesting superimposed infection. Correlate clinically and with urinalysis. No hydronephrosis.  The left kidney appears unremarkable.  Multiple small gallbladder polyps. No evidence of cholecystitis biliary ductal dilatation.  He was treated with Rocephin empirically.  He was seen by urology recommend outpatient surveillance with renal ultrasound every 6 months.   Gallbladder wall polyp: He is status post laparoscopic cholecystectomy Dr. Michaell Cowing 08/16/2023 follow-up with general surgery on discharge  essential hypertension Blood pressure stable with no antihypertensive medication.   Hyperlipidemia Continue statins.      Estimated body mass index is 31.35 kg/m as calculated from the following:   Height as of this encounter: 5\' 9"  (1.753 m).   Weight as of this encounter: 96.3 kg.  Discharge Instructions  Discharge Instructions     Call MD for:   Complete by: As directed    FEVER > 101.5 F  (temperatures < 101.5 F are not significant)   Call MD for:  extreme fatigue   Complete by: As directed    Call MD for:  persistant dizziness or light-headedness   Complete by: As directed    Call MD for:  persistant nausea and vomiting   Complete by: As directed    Call MD for:  redness, tenderness, or signs of infection (pain, swelling, redness, odor or green/yellow discharge around incision site)   Complete by: As directed    Call MD for:  severe uncontrolled pain   Complete by: As directed    Diet - low sodium heart healthy   Complete by: As directed    Start with a bland diet such as soups, liquids, starchy foods, low fat foods, etc. the first few days at home. Gradually advance to a solid, low-fat, high fiber diet by the end of the first week at home.   Add a fiber supplement to your diet (Metamucil, etc) If you feel full, bloated, or constipated, stay on a full liquid or pureed/blenderized diet for a few days until you feel better and are no longer constipated.   Diet - low sodium heart healthy   Complete by: As directed    Discharge instructions   Complete by: As directed    See Discharge Instructions If you are not getting better after two weeks or are noticing you are getting worse, contact our office [phone] for further advice.  We may need to adjust your medications, re-evaluate you in the office, send you to the emergency room, or see what other things we can do to help. The clinic staff is available to answer your questions during regular business hours (8:30am-5pm).  Please don't hesitate to call and ask to speak to one of our nurses for clinical concerns.    A surgeon from [facility] is always on call at the hospitals 24 hours/day If you have a medical emergency, go to the nearest emergency room or call 911.   Discharge wound care:   Complete by: As directed    It is good for closed incisions and even open wounds to be washed every day.  Shower every day.  Short baths are fine.  Wash the incisions and wounds clean with soap & water.    You may leave closed incisions open to air if it is dry.   You may cover the incision with clean gauze & replace it after your daily shower for comfort.  TEGADERM:  You have clear gauze band-aid dressings over your closed incision(s).  Remove the dressings 2/22 Saturday, two days after surgery.   Driving Restrictions   Complete by: As directed    You may drive when: - you are no longer taking narcotic prescription pain medication - you can comfortably wear a seatbelt - you can safely make sudden turns/stops without pain.   Increase activity slowly   Complete by: As directed    Start light daily activities --- self-care, walking, climbing stairs- beginning the day after surgery.  Gradually increase activities as tolerated.  Control your pain to be active.  Stop when you are tired.  Ideally, walk several times a day, eventually an hour a day.   Most people are back to most day-to-day activities in a few weeks.  It takes 4-6 weeks to get back to unrestricted, intense activity. If you can walk 30 minutes without difficulty, it is safe to try more intense activity such as jogging, treadmill, bicycling, low-impact aerobics, swimming, etc. Save the most intensive and strenuous activity for last (Usually 4-8 weeks after surgery) such as sit-ups, heavy lifting, contact sports, etc.  Refrain from any intense heavy lifting or straining until you are off narcotics for pain control.  You will have off days, but things should improve week-by-week. DO NOT PUSH THROUGH PAIN.  Let pain be your guide: If it hurts to do something, don't do it.    Increase activity slowly   Complete by: As directed    Lifting restrictions   Complete by: As directed    If you can walk 30 minutes without difficulty, it is safe to try more intense activity such as jogging, treadmill, bicycling, low-impact aerobics, swimming, etc. Save the most intensive and strenuous activity for last (Usually 4-8 weeks after surgery) such as sit-ups, heavy lifting, contact sports, etc.   Refrain from any intense heavy lifting or straining until you are off narcotics for pain control.  You will have off days, but things should improve week-by-week. DO NOT PUSH THROUGH PAIN.  Let pain be your guide: If it hurts to do something, don't do it.  Pain is your body warning you to avoid that activity for another week until the pain goes down.   May shower / Bathe   Complete by: As directed    May walk up steps   Complete by: As directed    No wound care   Complete by: As  directed    Remove dressing in 48 hours   Complete by: As directed    Make sure all dressings have been removed on the second day after surgery = 2/22 Saturday Leave incisions open to air.  OK to cover incisions with gauze or bandages as desired   Sexual Activity Restrictions   Complete by: As directed    You may have sexual intercourse when it is comfortable. If it hurts to do something, stop.      Allergies as of 08/17/2023   No Known Allergies      Medication List     TAKE these medications    acetaminophen 325 MG tablet Commonly known as: TYLENOL Take 650 mg by mouth every 6 (six) hours as needed for mild pain (pain score 1-3).   aspirin EC 81 MG tablet Take 81 mg by mouth daily.   atorvastatin 80 MG tablet Commonly known as: LIPITOR Take  1 tablet  Daily  for Cholesterol What changed:  how much to take how to take this when to take this   cholecalciferol 1000 units tablet Commonly known as: VITAMIN D Take 10,000 Units by mouth daily.   nicotine 21 mg/24hr patch Commonly known as:  NICODERM CQ - dosed in mg/24 hours Place 1 patch (21 mg total) onto the skin daily. Start taking on: August 18, 2023   ondansetron 4 MG tablet Commonly known as: ZOFRAN Take 1 tablet (4 mg total) by mouth every 8 (eight) hours as needed for nausea.   tadalafil 20 MG tablet Commonly known as: Cialis Take 1/2 to 1 tablet every 2 to 3 days as needed for XXXX   traMADol 50 MG tablet Commonly known as: ULTRAM Take 1-2 tablets (50-100 mg total) by mouth every 6 (six) hours as needed for moderate pain (pain score 4-6) or severe pain (pain score 7-10).               Discharge Care Instructions  (From admission, onward)           Start     Ordered   08/16/23 0000  Discharge wound care:       Comments: It is good for closed incisions and even open wounds to be washed every day.  Shower every day.  Short baths are fine.  Wash the incisions and wounds clean with soap & water.    You may leave closed incisions open to air if it is dry.   You may cover the incision with clean gauze & replace it after your daily shower for comfort.  TEGADERM:  You have clear gauze band-aid dressings over your closed incision(s).  Remove the dressings 2/22 Saturday, two days after surgery.   08/16/23 1055            Follow-up Information     Manny, Delbert Phenix., MD Follow up.   Specialty: Urology Why: Office will call to arrange visit wtih kidney ultrasound and MD eval in about 6 months. Contact information: [address] [place] [address] [phone]         Reine Just, PA-C Follow up on 09/11/2023.   Specialty: General Surgery Why: Your appointment is on 3/18 at 11:30. Please arrive 30 minutes prior to your appointment for paperwork. Please bring a copy of your photo ID and insurance card. Contact information: [address] [place] [address] [phone]                No  Known Allergies  Consultations:surgery   Procedures/Studies: DG  C-Arm 1-60 Min-No Report Result Date: 08/16/2023 Fluoroscopy was utilized by the requesting physician.  No radiographic interpretation.   MR ABDOMEN W WO CONTRAST Result Date: 08/14/2023 CLINICAL DATA:  Renal mass/cyst, indeterminate. Patient presented with right-sided abdominal pain and had an indeterminate right renal lesion on CT. EXAM: MRI ABDOMEN WITHOUT AND WITH CONTRAST TECHNIQUE: Multiplanar multisequence MR imaging of the abdomen was performed both before and after the administration of intravenous contrast. CONTRAST:  10mL GADAVIST GADOBUTROL 1 MMOL/ML IV SOLN COMPARISON:  Right upper quadrant abdominal ultrasound and abdominopelvic CT 08/13/2023. FINDINGS: Lower chest:  The visualized lower chest appears unremarkable. Hepatobiliary: The liver has a non cirrhotic morphology without significant steatosis. No focal liver lesion or suspicious enhancement. As seen on ultrasound, there are multiple small gallbladder polyps. No significant gallbladder wall thickening, surrounding inflammation or biliary ductal dilatation. Pancreas: Unremarkable. No pancreatic ductal dilatation or surrounding inflammatory changes. Spleen: Normal in size without focal abnormality. Adrenals/Urinary Tract: Both adrenal glands appear normal. The left kidney appears normal. As seen on CT, there is a bilobed cystic lesion in the upper interpolar region of the right kidney which likely represents 2 adjacent complex cysts, measuring 2.7 x 2.6 cm, and 3.3 x 3.3 cm, respectively. Both of these lesions demonstrate heterogeneous T1 and T2 signal. Following contrast, there is no wall thickening or abnormal associated enhancement. There is persistent asymmetric perinephric soft tissue stranding on the right with fluid tracking into the right pericolic gutter. This suggests possible superimposed infection. No hydronephrosis or perinephric soft tissue stranding on the left. Bladder not imaged. Stomach/Bowel: The stomach appears unremarkable  for its degree of distension. No evidence of bowel wall thickening, distention or surrounding inflammatory change.Mildly prominent stool in the right colon, unchanged. Vascular/Lymphatic: There are no enlarged abdominal lymph nodes. No evidence of aneurysm or large vessel occlusion. The renal veins and IVC are patent. Other: As above, asymmetric perinephric soft tissue stranding on the right with ill-defined fluid tracking into the right pericolic gutter. No generalized ascites or drainable fluid collection. Musculoskeletal: No acute or significant osseous findings. No evidence of discitis or osteomyelitis. IMPRESSION: 1. The indeterminate right renal lesion seen on CT represents two adjacent complex cysts (Bosniak 2). No evidence of solid renal mass or abnormal enhancement. Persistent associated asymmetric perinephric soft tissue stranding on the right with ill-defined fluid tracking into the right pericolic gutter, suggesting superimposed infection. Correlate clinically and with urinalysis. 2. No hydronephrosis.  The left kidney appears unremarkable. 3. Multiple small gallbladder polyps. No evidence of cholecystitis or biliary ductal dilatation. 4. No other significant findings. Electronically Signed   By: Carey Bullocks M.D.   On: 08/14/2023 18:52   CT ABDOMEN PELVIS W CONTRAST Result Date: 08/13/2023 CLINICAL DATA:  Right-sided abdominal pain. EXAM: CT ABDOMEN AND PELVIS WITH CONTRAST TECHNIQUE: Multidetector CT imaging of the abdomen and pelvis was performed using the standard protocol following bolus administration of intravenous contrast. RADIATION DOSE REDUCTION: This exam was performed according to the departmental dose-optimization program which includes automated exposure control, adjustment of the mA and/or kV according to patient size and/or use of iterative reconstruction technique. CONTRAST:  OMNIPAQUE IOHEXOL 300 MG/ML  SOLN COMPARISON:  Ultrasound dated 08/13/2023. FINDINGS: Lower chest:  The visualized lung bases are clear. No intra-abdominal free air or free fluid. Hepatobiliary: Fatty liver. No biliary dilatation. No calcified gallstones. Tiny nodular densities in the gallbladder better evaluated on the ultrasound and may represent polyps. No pericholecystic fluid. Pancreas:  Unremarkable. No pancreatic ductal dilatation or surrounding inflammatory changes. Spleen: Normal in size without focal abnormality. Adrenals/Urinary Tract: The adrenal glands are unremarkable. There is a 5.7 x 3.3 cm bilobed and partially exophytic cyst arising from the upper pole of the right kidney. There is areas of higher attenuation within this cyst which may represent hemorrhage, proteinaceous debris, or less likely enhancing tissue. There is stranding and edema of the perinephric fat adjacent to the cyst. This may represent an infected cyst or an abscess. Clinical correlation and follow-up to resolution recommended to exclude malignancy. The left kidney is unremarkable. The visualized ureters and urinary bladder appear unremarkable. Stomach/Bowel: There is no bowel obstruction or active inflammation. The appendix is normal. Vascular/Lymphatic: Mild aortoiliac atherosclerotic disease. The IVC is unremarkable. No portal venous gas. There is no adenopathy. Reproductive: The prostate and seminal vesicles are grossly unremarkable. No pelvic mass. Other: None Musculoskeletal: Right L5 pars defect. No listhesis. Mild degenerative changes of spine. No acute osseous pathology. IMPRESSION: 1. Partially exophytic right renal upper pole infected cyst versus abscess. Follow-up to resolution recommended to exclude malignancy. 2. No bowel obstruction. Normal appendix. 3. Fatty liver. 4.  Aortic Atherosclerosis (ICD10-I70.0). Electronically Signed   By: Elgie Collard M.D.   On: 08/13/2023 18:05   US Abdomen Limited RUQ (LIVER/GB) Result Date: 08/13/2023 CLINICAL DATA:  [id number] RUQ abdominal pain [id number] EXAM: ULTRASOUND ABDOMEN  LIMITED RIGHT UPPER QUADRANT COMPARISON:  None Available. FINDINGS: Gallbladder: Physiologically distended. There are multiple sessile gallbladder polyps with largest measuring up to 5 x 7 mm. No associated focal wall thickening. However, the gallbladder wall is diffusely thickened measuring up to 3.5 mm. No pericholecystic free fluid. Sonographic Murphy's sign was positive as per the technologist. Common bile duct: Diameter: Less than 4 mm.  No intrahepatic bile duct dilation. Liver: No focal lesion identified. Within normal limits in parenchymal echogenicity. Portal vein is patent on color Doppler imaging with normal direction of blood flow towards the liver. Other: None. IMPRESSION: 1. There are multiple sessile gallbladder polyps, which can be characterized as low risk polyps. Largest polyp measured up to 5 x 7 mm. Follow-up ultrasound is recommended in 1 year to document stability. Please see gallbladder follow-up reference below. 2. Partially distended gallbladder and no pericholecystic free fluid; however, exhibiting mild diffuse wall thickening and technologist noted positive sonographic Murphy's sign. Findings are equivocal for acute cholecystitis but considered less likely. Correlate clinically to determine the need for additional imaging. 3. Otherwise unremarkable exam. Management of Incidentally Detected Gallbladder Polyps: Society of Radiologists in Ultrasound Consensus Conference Recommendations (published online December 28, 2020): [email or web address] Extremely low-risk polyps, i.e. pedunculated ball-on-the-wall or thin stalk: <9 mm: no follow-up 10-14 mm: follow-up at 6, 12 and 24 months >15 mm: surgical consult Low-risk polyps, i.e. pedunculated with a thick or wide stalk, or sessile: ?6 mm: no follow-up 7-9 mm follow-up ultrasound at 12 months 10-14 mm: follow-up ultrasound at 6, 12, 24, and 36 months vs surgical consult >15 mm: surgical consult Intermediate risk: focal wall-thickening  >60mm adjacent to polyp: <6 mm: follow-up at 6, 12, 24, 36 months vs surgical consult >7 mm: surgical consult Electronically Signed   By: Jules Schick M.D.   On: 08/13/2023 11:12   (Echo, Carotid, EGD, Colonoscopy, ERCP)    Subjective: Anxious to go home  Discharge Exam: Vitals:   08/16/23 2122 08/17/23 0537  BP: (!) 140/79 118/73  Pulse: 73 63  Resp: 18 18  Temp: 97.7 F (36.5 C) 97.9 F (36.6 C)  SpO2: 97% 97%   Vitals:   08/16/23 1459 08/16/23 1744 08/16/23 2122 08/17/23 0537  BP: 128/74 130/65 (!) 140/79 118/73  Pulse: (!) 57 70 73 63  Resp: 17 17 18 18   Temp: (!) 97.5 F (36.4 C) 97.9 F (36.6 C) 97.7 F (36.5 C) 97.9 F (36.6 C)  TempSrc: Axillary Oral Oral Oral  SpO2: 97% 97% 97% 97%  Weight:      Height:        General: Pt is alert, awake, not in acute distress Cardiovascular: RRR, S1/S2 +, no rubs, no gallops Respiratory: CTA bilaterally, no wheezing, no rhonchi Abdominal: Soft, NT, ND, bowel sounds + Extremities: no edema, no cyanosis    The results of significant diagnostics from this hospitalization (including imaging, microbiology, ancillary and laboratory) are listed below for reference.     Microbiology: Recent Results (from the past 240 hours)  Resp panel by RT-PCR (RSV, Flu A&B, Covid) Anterior Nasal Swab     Status: None   Collection Time: 08/13/23  8:58 AM   Specimen: Anterior Nasal Swab  Result Value Ref Range Status   SARS Coronavirus 2 by RT PCR NEGATIVE NEGATIVE Final    Comment: (NOTE) SARS-CoV-2 target nucleic acids are NOT DETECTED.  The SARS-CoV-2 RNA is generally detectable in upper respiratory specimens during the acute phase of infection. The lowest concentration of SARS-CoV-2 viral copies this assay can detect is 138 copies/mL. A negative result does not preclude SARS-Cov-2 infection and should not be used as the sole basis for treatment or other patient management decisions. A negative result may occur with  improper  specimen collection/handling, submission of specimen other than nasopharyngeal swab, presence of viral mutation(s) within the areas targeted by this assay, and inadequate number of viral copies(<138 copies/mL). A negative result must be combined with clinical observations, patient history, and epidemiological information. The expected result is Negative.  Fact Sheet for Patients:  BloggerCourse.com  Fact Sheet for Healthcare Providers:  SeriousBroker.it  This test is no t yet approved or cleared by the [place] FDA and  has been authorized for detection and/or diagnosis of SARS-CoV-2 by FDA under an Emergency Use Authorization (EUA). This EUA will remain  in effect (meaning this test can be used) for the duration of the COVID-19 declaration under Section 564(b)(1) of the Act, 21 U.S.C.section 360bbb-3(b)(1), unless the authorization is terminated  or revoked sooner.       Influenza A by PCR NEGATIVE NEGATIVE Final   Influenza B by PCR NEGATIVE NEGATIVE Final    Comment: (NOTE) The Xpert Xpress SARS-CoV-2/FLU/RSV plus assay is intended as an aid in the diagnosis of influenza from Nasopharyngeal swab specimens and should not be used as a sole basis for treatment. Nasal washings and aspirates are unacceptable for Xpert Xpress SARS-CoV-2/FLU/RSV testing.  Fact Sheet for Patients: BloggerCourse.com  Fact Sheet for Healthcare Providers: SeriousBroker.it  This test is not yet approved or cleared by the [place] FDA and has been authorized for detection and/or diagnosis of SARS-CoV-2 by FDA under an Emergency Use Authorization (EUA). This EUA will remain in effect (meaning this test can be used) for the duration of the COVID-19 declaration under Section 564(b)(1) of the Act, 21 U.S.C. section 360bbb-3(b)(1), unless the authorization is terminated or revoked.     Resp  Syncytial Virus by PCR NEGATIVE NEGATIVE Final    Comment: (NOTE) Fact Sheet for Patients: BloggerCourse.com  Fact Sheet for Healthcare Providers: SeriousBroker.it  This test is not yet approved or cleared by  the [employer] and has been authorized for detection and/or diagnosis of SARS-CoV-2 by FDA under an Emergency Use Authorization (EUA). This EUA will remain in effect (meaning this test can be used) for the duration of the COVID-19 declaration under Section 564(b)(1) of the Act, 21 U.S.C. section 360bbb-3(b)(1), unless the authorization is terminated or revoked.  Performed at [facility], [address] [place], [place] [address]   Surgical pcr screen     Status: None   Collection Time: 08/14/23  5:22 AM   Specimen: Nasal Mucosa; Nasal Swab  Result Value Ref Range Status   MRSA, PCR NEGATIVE NEGATIVE Final   Staphylococcus aureus NEGATIVE NEGATIVE Final    Comment: (NOTE) The Xpert SA Assay (FDA approved for NASAL specimens in patients 78 years of age and older), is one component of a comprehensive surveillance program. It is not intended to diagnose infection nor to guide or monitor treatment. Performed at [facility], [address] [place], [place] [address]      Labs: BNP (last 3 results) No results for input(s): "BNP" in the last 8760 hours. Basic Metabolic Panel: Recent Labs  Lab 08/13/23 0903 08/14/23 0451 08/16/23 0505 08/17/23 0457  NA 136 134* 140 134*  K 4.2 3.9 4.3 4.0  CL 102 100 104 100  CO2 25 24 26 24   GLUCOSE 114* 87 78 114*  BUN 12 13 10 17   CREATININE 0.85 1.01 0.93 0.93  CALCIUM 9.7 9.0 9.0 8.9   Liver Function Tests: Recent Labs  Lab 08/13/23 0903 08/14/23 0451 08/15/23 0513 08/16/23 0505 08/17/23 0457  AST 20 13* 14* 13* 27  ALT 18 14 14 13 27   ALKPHOS 84 74 76 65 63  BILITOT 1.2 1.5* 1.5* 0.9 0.7  PROT 7.9 6.7 7.3 6.5 6.6   ALBUMIN 4.4 3.6 3.9 3.6 3.5   Recent Labs  Lab 08/13/23 0903  LIPASE 43   No results for input(s): "AMMONIA" in the last 168 hours. CBC: Recent Labs  Lab 08/13/23 0903 08/14/23 0451 08/15/23 0513 08/16/23 0505 08/17/23 0457  WBC 11.2* 10.5 8.8 5.7 13.5*  NEUTROABS 8.8*  --   --   --   --   HGB 16.4 14.9 16.1 14.8 14.1  HCT 48.0 45.2 48.5 43.6 41.1  MCV 90.4 92.2 92.0 89.0 88.6  PLT 239 220 257 243 268   Cardiac Enzymes: No results for input(s): "CKTOTAL", "CKMB", "CKMBINDEX", "TROPONINI" in the last 168 hours. BNP: Invalid input(s): "POCBNP" CBG: No results for input(s): "GLUCAP" in the last 168 hours. D-Dimer No results for input(s): "DDIMER" in the last 72 hours. Hgb A1c No results for input(s): "HGBA1C" in the last 72 hours. Lipid Profile No results for input(s): "CHOL", "HDL", "LDLCALC", "TRIG", "CHOLHDL", "LDLDIRECT" in the last 72 hours. Thyroid function studies No results for input(s): "TSH", "T4TOTAL", "T3FREE", "THYROIDAB" in the last 72 hours.  Invalid input(s): "FREET3" Anemia work up No results for input(s): "VITAMINB12", "FOLATE", "FERRITIN", "TIBC", "IRON", "RETICCTPCT" in the last 72 hours. Urinalysis    Component Value Date/Time   COLORURINE YELLOW 08/13/2023 1727   APPEARANCEUR CLEAR 08/13/2023 1727   LABSPEC >1.046 (H) 08/13/2023 1727   PHURINE 6.0 08/13/2023 1727   GLUCOSEU NEGATIVE 08/13/2023 1727   HGBUR NEGATIVE 08/13/2023 1727   BILIRUBINUR NEGATIVE 08/13/2023 1727   KETONESUR NEGATIVE 08/13/2023 1727   PROTEINUR NEGATIVE 08/13/2023 1727   UROBILINOGEN 0.2 05/24/2008 0713   NITRITE NEGATIVE 08/13/2023 1727   LEUKOCYTESUR NEGATIVE 08/13/2023 1727   Sepsis  Labs Recent Labs  Lab 08/14/23 0451 08/15/23 0513 08/16/23 0505 08/17/23 0457  WBC 10.5 8.8 5.7 13.5*   Microbiology Recent Results (from the past 240 hours)  Resp panel by RT-PCR (RSV, Flu A&B, Covid) Anterior Nasal Swab     Status: None   Collection Time: 08/13/23  8:58  AM   Specimen: Anterior Nasal Swab  Result Value Ref Range Status   SARS Coronavirus 2 by RT PCR NEGATIVE NEGATIVE Final    Comment: (NOTE) SARS-CoV-2 target nucleic acids are NOT DETECTED.  The SARS-CoV-2 RNA is generally detectable in upper respiratory specimens during the acute phase of infection. The lowest concentration of SARS-CoV-2 viral copies this assay can detect is 138 copies/mL. A negative result does not preclude SARS-Cov-2 infection and should not be used as the sole basis for treatment or other patient management decisions. A negative result may occur with  improper specimen collection/handling, submission of specimen other than nasopharyngeal swab, presence of viral mutation(s) within the areas targeted by this assay, and inadequate number of viral copies(<138 copies/mL). A negative result must be combined with clinical observations, patient history, and epidemiological information. The expected result is Negative.  Fact Sheet for Patients:  BloggerCourse.com  Fact Sheet for Healthcare Providers:  SeriousBroker.it  This test is no t yet approved or cleared by the [place] FDA and  has been authorized for detection and/or diagnosis of SARS-CoV-2 by FDA under an Emergency Use Authorization (EUA). This EUA will remain  in effect (meaning this test can be used) for the duration of the COVID-19 declaration under Section 564(b)(1) of the Act, 21 U.S.C.section 360bbb-3(b)(1), unless the authorization is terminated  or revoked sooner.       Influenza A by PCR NEGATIVE NEGATIVE Final   Influenza B by PCR NEGATIVE NEGATIVE Final    Comment: (NOTE) The Xpert Xpress SARS-CoV-2/FLU/RSV plus assay is intended as an aid in the diagnosis of influenza from Nasopharyngeal swab specimens and should not be used as a sole basis for treatment. Nasal washings and aspirates are unacceptable for Xpert Xpress  SARS-CoV-2/FLU/RSV testing.  Fact Sheet for Patients: BloggerCourse.com  Fact Sheet for Healthcare Providers: SeriousBroker.it  This test is not yet approved or cleared by the [place] FDA and has been authorized for detection and/or diagnosis of SARS-CoV-2 by FDA under an Emergency Use Authorization (EUA). This EUA will remain in effect (meaning this test can be used) for the duration of the COVID-19 declaration under Section 564(b)(1) of the Act, 21 U.S.C. section 360bbb-3(b)(1), unless the authorization is terminated or revoked.     Resp Syncytial Virus by PCR NEGATIVE NEGATIVE Final    Comment: (NOTE) Fact Sheet for Patients: BloggerCourse.com  Fact Sheet for Healthcare Providers: SeriousBroker.it  This test is not yet approved or cleared by the [place] FDA and has been authorized for detection and/or diagnosis of SARS-CoV-2 by FDA under an Emergency Use Authorization (EUA). This EUA will remain in effect (meaning this test can be used) for the duration of the COVID-19 declaration under Section 564(b)(1) of the Act, 21 U.S.C. section 360bbb-3(b)(1), unless the authorization is terminated or revoked.  Performed at [facility], [address], [place], [place] [address]   Surgical pcr screen     Status: None   Collection Time: 08/14/23  5:22 AM   Specimen: Nasal Mucosa; Nasal Swab  Result Value Ref Range Status   MRSA, PCR NEGATIVE NEGATIVE Final   Staphylococcus aureus NEGATIVE NEGATIVE Final    Comment: (NOTE)  The Xpert SA Assay (FDA approved for NASAL specimens in patients 61 years of age and older), is one component of a comprehensive surveillance program. It is not intended to diagnose infection nor to guide or monitor treatment. Performed at [facility], [address], [place], [place] [address]     TIME  SPEND MORE THAN 30 MIN  SIGNED: Alwyn Ren, MD  [facility] 08/17/2023, 10:52 AM

## 2023-08-17 NOTE — Progress Notes (Signed)
1 Day Post-Op  Subjective: CC: Tolerating HH diet without n/v. Improved RUQ pain since surgery that is well controlled with po medications. Mobilizing. Voiding.   Objective: Vital signs in last 24 hours: Temp:  [97.3 F (36.3 C)-98.6 F (37 C)] 97.9 F (36.6 C) (02/21 0537) Pulse Rate:  [55-73] 63 (02/21 0537) Resp:  [11-18] 18 (02/21 0537) BP: (118-158)/(65-92) 118/73 (02/21 0537) SpO2:  [97 %-100 %] 97 % (02/21 0537) Last BM Date : 08/13/23  Intake/Output from previous day: 02/20 0701 - 02/21 0700 In: 2330 [P.O.:1340; I.V.:840; IV Piggyback:150] Out: 10 [Blood:10] Intake/Output this shift: No intake/output data recorded.  PE: Gen:  Alert, NAD, pleasant Abd: Soft, no distension, mild and improved RUQ ttp and appropriately tender around laparoscopic incisions, no rigidity or guarding and otherwise NT, +BS. Laparoscopic incisions with gauze and tegaderm in place, cdi  Lab Results:  Recent Labs    08/16/23 0505 08/17/23 0457  WBC 5.7 13.5*  HGB 14.8 14.1  HCT 43.6 41.1  PLT 243 268   BMET Recent Labs    08/16/23 0505 08/17/23 0457  NA 140 134*  K 4.3 4.0  CL 104 100  CO2 26 24  GLUCOSE 78 114*  BUN 10 17  CREATININE 0.93 0.93  CALCIUM 9.0 8.9   PT/INR Recent Labs    08/14/23 1013  LABPROT 14.5  INR 1.1   CMP     Component Value Date/Time   NA 134 (L) 08/17/2023 0457   K 4.0 08/17/2023 0457   CL 100 08/17/2023 0457   CO2 24 08/17/2023 0457   GLUCOSE 114 (H) 08/17/2023 0457   BUN 17 08/17/2023 0457   CREATININE 0.93 08/17/2023 0457   CREATININE 1.08 04/02/2023 1456   CALCIUM 8.9 08/17/2023 0457   PROT 6.6 08/17/2023 0457   ALBUMIN 3.5 08/17/2023 0457   AST 27 08/17/2023 0457   ALT 27 08/17/2023 0457   ALKPHOS 63 08/17/2023 0457   BILITOT 0.7 08/17/2023 0457   GFRNONAA >60 08/17/2023 0457   GFRNONAA 102 12/20/2020 1102   GFRAA 118 12/20/2020 1102   Lipase     Component Value Date/Time   LIPASE 43 08/13/2023 0903     Studies/Results: DG C-Arm 1-60 Min-No Report Result Date: 08/16/2023 Fluoroscopy was utilized by the requesting physician.  No radiographic interpretation.    Anti-infectives: Anti-infectives (From admission, onward)    Start     Dose/Rate Route Frequency Ordered Stop   08/15/23 0815  cefTRIAXone (ROCEPHIN) 2 g in sodium chloride 0.9 % 100 mL IVPB  Status:  Discontinued        2 g 200 mL/hr over 30 Minutes Intravenous On call to O.R. 08/15/23 2956 08/16/23 0559   08/13/23 2315  cefTRIAXone (ROCEPHIN) 2 g in sodium chloride 0.9 % 100 mL IVPB        2 g 200 mL/hr over 30 Minutes Intravenous Every 24 hours 08/13/23 2228 08/20/23 2314        Assessment/Plan POD 1 s/p single site laparoscopic cholecystectomy by Dr. Michaell Cowing on 08/16/23 for Acute on Chronic Calculus Cholecystitis and Gallbladder Polyps on imaging - IOC aborted intra-op. LFT's wnl today.  - Path pending.  - Tolerating HH diet - Pain well controlled with PO medications - Voiding - Mobilizing - He is okay for d/c from our standpoint. Follow up arranged. Will send pain meds to pharmacy and write for a work note. Discussed discharge instructions, restrictions and return/call back precautions. No further abx needed at d/c.  FEN - HH VTE - SCDs, okay for chem ppx from our standpoint ID - Rocephin  CT w/ Partially exophytic right renal upper pole cyst/mass. UA negative. Urology has seen. MRI obtained. Urology plans for outpatient surveillance with renal US in 6mos    LOS: 4 days    Jacinto Halim, [facility] 08/17/2023, 9:24 AM Please see Amion for pager number during day hours 7:00am-4:30pm

## 2023-08-17 NOTE — TOC Transition Note (Signed)
Transition of Care [facility]) - Discharge Note   Patient Details  Name: Neil Henderson MRN: [id number] Date of Birth: 03-Dec-1975  [facility] CM/SW Contact:  Lanier Clam, RN Phone Number: 08/17/2023, 11:43 AM   Clinical Narrative: d/c home no further needs.See prior TOC note.      Final next level of care: Home/Self Care Barriers to Discharge: No Barriers Identified   Patient Goals and CMS Choice Patient states their goals for this hospitalization and ongoing recovery are:: Home CMS Medicare.gov Compare Post Acute Care list provided to:: Patient Choice offered to / list presented to : Patient      Discharge Placement                       Discharge Plan and Services Additional resources added to the After Visit Summary for                                       Social Drivers of Health (SDOH) Interventions SDOH Screenings   Food Insecurity: No Food Insecurity (08/13/2023)  Housing: High Risk (08/13/2023)  Transportation Needs: No Transportation Needs (08/13/2023)  Utilities: At Risk (08/13/2023)  Depression (PHQ2-9): Low Risk  (12/18/2021)  Social Connections: Moderately Isolated (08/13/2023)  Tobacco Use: Medium Risk (08/13/2023)     Readmission Risk Interventions    08/16/2023    1:15 PM  Readmission Risk Prevention Plan  Post Dischage Appt Not Complete  Appt Comments pt will need to secure PCP under his insurance plan  Medication Screening Complete  Transportation Screening Complete

## 2023-08-17 NOTE — Plan of Care (Signed)
 ?  Problem: Clinical Measurements: ?Goal: Will remain free from infection ?Outcome: Progressing ?  ?

## 2023-08-17 NOTE — Progress Notes (Signed)
Patient requests different pain medication for discharge than prescribed Tramadol, paged PA, awaiting return response

## 2023-09-17 DIAGNOSIS — N50819 Testicular pain, unspecified: Secondary | ICD-10-CM | POA: Diagnosis not present

## 2023-09-17 DIAGNOSIS — R3914 Feeling of incomplete bladder emptying: Secondary | ICD-10-CM | POA: Diagnosis not present

## 2023-10-01 DIAGNOSIS — N451 Epididymitis: Secondary | ICD-10-CM | POA: Diagnosis not present

## 2023-10-01 DIAGNOSIS — R3914 Feeling of incomplete bladder emptying: Secondary | ICD-10-CM | POA: Diagnosis not present

## 2023-10-08 ENCOUNTER — Ambulatory Visit: Payer: BC Managed Care – PPO | Admitting: Internal Medicine

## 2023-10-09 ENCOUNTER — Ambulatory Visit: Payer: BC Managed Care – PPO | Admitting: Internal Medicine

## 2023-10-29 ENCOUNTER — Ambulatory Visit: Payer: BC Managed Care – PPO | Admitting: Family Medicine

## 2023-11-15 DIAGNOSIS — R3914 Feeling of incomplete bladder emptying: Secondary | ICD-10-CM | POA: Diagnosis not present

## 2023-11-15 DIAGNOSIS — N451 Epididymitis: Secondary | ICD-10-CM | POA: Diagnosis not present

## 2023-11-15 DIAGNOSIS — R310 Gross hematuria: Secondary | ICD-10-CM | POA: Diagnosis not present

## 2024-01-04 ENCOUNTER — Encounter: Payer: BC Managed Care – PPO | Admitting: Internal Medicine

## 2024-01-14 ENCOUNTER — Ambulatory Visit: Payer: BC Managed Care – PPO | Admitting: Nurse Practitioner

## 2024-01-15 ENCOUNTER — Ambulatory Visit: Payer: BC Managed Care – PPO | Admitting: Nurse Practitioner

## 2024-02-01 NOTE — Progress Notes (Deleted)
 New Patient Visit  Subjective:     Patient ID: Neil Henderson, male    DOB: [birth year], 48 y.o.   MRN: [id number]  No chief complaint on file.   HPI  Discussed the use of AI scribe software for clinical note transcription with the patient, who gave verbal consent to proceed.  History of Present Illness      ROS Per HPI  Outpatient Encounter Medications as of 02/04/2024  Medication Sig   acetaminophen  (TYLENOL ) 325 MG tablet Take 650 mg by mouth every 6 (six) hours as needed for mild pain (pain score 1-3).   aspirin  EC 81 MG tablet Take 81 mg by mouth daily.   atorvastatin  (LIPITOR) 80 MG tablet Take  1 tablet  Daily  for Cholesterol (Patient taking differently: Take 40 mg by mouth daily. Take  1 tablet  Daily  for Cholesterol)   cholecalciferol  (VITAMIN D ) 1000 units tablet Take 10,000 Units by mouth daily.   nicotine  (NICODERM CQ  - DOSED IN MG/24 HOURS) 21 mg/24hr patch Place 1 patch (21 mg total) onto the skin daily.   ondansetron  (ZOFRAN ) 4 MG tablet Take 1 tablet (4 mg total) by mouth every 8 (eight) hours as needed for nausea.   tadalafil  (CIALIS ) 20 MG tablet Take 1/2 to 1 tablet every 2 to 3 days as needed for XXXX (Patient taking differently: Take 10-20 mg by mouth as needed for erectile dysfunction. Take 1/2 to 1 tablet every 2 to 3 days as needed for XXXX)   traMADol  (ULTRAM ) 50 MG tablet Take 1-2 tablets (50-100 mg total) by mouth every 6 (six) hours as needed for moderate pain (pain score 4-6) or severe pain (pain score 7-10).   No facility-administered encounter medications on file as of 02/04/2024.    Past Medical History:  Diagnosis Date   Hyperlipidemia    Hypertension    Hypogonadism male    Obesity    Prediabetes    Vitamin D  deficiency     Past Surgical History:  Procedure Laterality Date   CERVICAL FUSION     CHOLECYSTECTOMY N/A 08/16/2023   Procedure: LAPAROSCOPIC CHOLECYSTECTOMY SINGLE SITE;  Surgeon: Sheldon Standing, MD;  Location: [facility];   Service: General;  Laterality: N/A;    Family History  Problem Relation Age of Onset   Hyperlipidemia Mother    Hyperlipidemia Father    Cancer Paternal Grandfather        Lung    Social History   Socioeconomic History   Marital status: Married    Spouse name: Not on file   Number of children: Not on file   Years of education: Not on file   Highest education level: 12th grade  Occupational History   Not on file  Tobacco Use   Smoking status: Former    Current packs/day: 1.00    Average packs/day: 1 pack/day for 36.6 years (36.6 ttl pk-yrs)    Types: Cigarettes    Start date: 1989   Smokeless tobacco: Never  Substance and Sexual Activity   Alcohol use: Yes    Alcohol/week: 4.0 standard drinks of alcohol    Types: 4 Standard drinks or equivalent per week    Comment: moderate   Drug use: Not Currently    Frequency: 2.0 times per week    Types: Marijuana   Sexual activity: Not on file  Other Topics Concern   Not on file  Social History Narrative   Not on file   Social Drivers of Health  Financial Resource Strain: Medium Risk (10/29/2023)   Overall Financial Resource Strain (CARDIA)    Difficulty of Paying Living Expenses: Somewhat hard  Food Insecurity: No Food Insecurity (10/29/2023)   Hunger Vital Sign    Worried About Running Out of Food in the Last Year: Never true    Ran Out of Food in the Last Year: Never true  Transportation Needs: No Transportation Needs (10/29/2023)   PRAPARE - Administrator, Civil Service (Medical): No    Lack of Transportation (Non-Medical): No  Physical Activity: Sufficiently Active (10/29/2023)   Exercise Vital Sign    Days of Exercise per Week: 4 days    Minutes of Exercise per Session: 150+ min  Stress: Stress Concern Present (10/29/2023)   [employer] of Occupational Health - Occupational Stress Questionnaire    Feeling of Stress : Rather much  Social Connections: Moderately Isolated (10/29/2023)   Social Connection  and Isolation Panel    Frequency of Communication with Friends and Family: More than three times a week    Frequency of Social Gatherings with Friends and Family: Once a week    Attends Religious Services: Never    Database administrator or Organizations: No    Attends Banker Meetings: Never    Marital Status: Married  Catering manager Violence: Not At Risk (08/13/2023)   Humiliation, Afraid, Rape, and Kick questionnaire    Fear of Current or Ex-Partner: No    Emotionally Abused: No    Physically Abused: No    Sexually Abused: No       Objective:    There were no vitals taken for this visit.   Physical Exam Vitals and nursing note reviewed.  Constitutional:      General: He is not in acute distress.    Appearance: Normal appearance.  HENT:     Head: Normocephalic and atraumatic.     Right Ear: External ear normal.     Left Ear: External ear normal.     Nose: Nose normal.     Mouth/Throat:     Mouth: Mucous membranes are moist.     Pharynx: Oropharynx is clear.  Eyes:     Extraocular Movements: Extraocular movements intact.  Cardiovascular:     Rate and Rhythm: Normal rate and regular rhythm.     Pulses: Normal pulses.     Heart sounds: Normal heart sounds.  Pulmonary:     Effort: Pulmonary effort is normal. No respiratory distress.     Breath sounds: Normal breath sounds. No wheezing, rhonchi or rales.  Musculoskeletal:        General: Normal range of motion.     Cervical back: Normal range of motion.     Right lower leg: No edema.     Left lower leg: No edema.  Lymphadenopathy:     Cervical: No cervical adenopathy.  Skin:    General: Skin is warm and dry.  Neurological:     General: No focal deficit present.     Mental Status: He is alert and oriented to person, place, and time.  Psychiatric:        Mood and Affect: Mood normal.        Behavior: Behavior normal.     No results found for any visits on 02/04/24.      Assessment & Plan:    Assessment and Plan Assessment & Plan      No orders of the defined types were placed in this encounter.  No orders of the defined types were placed in this encounter.   No follow-ups on file.  Corean LITTIE Ku, FNP

## 2024-02-01 NOTE — Patient Instructions (Incomplete)

## 2024-02-04 ENCOUNTER — Ambulatory Visit: Admitting: Family Medicine

## 2024-02-04 DIAGNOSIS — E291 Testicular hypofunction: Secondary | ICD-10-CM

## 2024-02-04 DIAGNOSIS — I10 Essential (primary) hypertension: Secondary | ICD-10-CM

## 2024-02-04 DIAGNOSIS — E559 Vitamin D deficiency, unspecified: Secondary | ICD-10-CM

## 2024-02-04 DIAGNOSIS — E782 Mixed hyperlipidemia: Secondary | ICD-10-CM

## 2024-02-11 DIAGNOSIS — E349 Endocrine disorder, unspecified: Secondary | ICD-10-CM | POA: Diagnosis not present

## 2024-02-11 DIAGNOSIS — R3914 Feeling of incomplete bladder emptying: Secondary | ICD-10-CM | POA: Diagnosis not present

## 2024-02-11 DIAGNOSIS — R310 Gross hematuria: Secondary | ICD-10-CM | POA: Diagnosis not present

## 2024-02-11 DIAGNOSIS — D49511 Neoplasm of unspecified behavior of right kidney: Secondary | ICD-10-CM | POA: Diagnosis not present

## 2024-02-18 DIAGNOSIS — Z1389 Encounter for screening for other disorder: Secondary | ICD-10-CM | POA: Diagnosis not present

## 2024-02-18 DIAGNOSIS — N529 Male erectile dysfunction, unspecified: Secondary | ICD-10-CM | POA: Diagnosis not present

## 2024-02-18 DIAGNOSIS — Z125 Encounter for screening for malignant neoplasm of prostate: Secondary | ICD-10-CM | POA: Diagnosis not present

## 2024-02-18 DIAGNOSIS — E785 Hyperlipidemia, unspecified: Secondary | ICD-10-CM | POA: Diagnosis not present

## 2024-02-18 DIAGNOSIS — I10 Essential (primary) hypertension: Secondary | ICD-10-CM | POA: Diagnosis not present

## 2024-03-10 DIAGNOSIS — Z23 Encounter for immunization: Secondary | ICD-10-CM | POA: Diagnosis not present

## 2024-03-10 DIAGNOSIS — Z0000 Encounter for general adult medical examination without abnormal findings: Secondary | ICD-10-CM | POA: Diagnosis not present

## 2024-03-10 DIAGNOSIS — E559 Vitamin D deficiency, unspecified: Secondary | ICD-10-CM | POA: Diagnosis not present

## 2024-03-10 DIAGNOSIS — F17210 Nicotine dependence, cigarettes, uncomplicated: Secondary | ICD-10-CM | POA: Diagnosis not present

## 2024-03-11 ENCOUNTER — Other Ambulatory Visit: Payer: Self-pay | Admitting: Physician Assistant

## 2024-03-11 DIAGNOSIS — F17210 Nicotine dependence, cigarettes, uncomplicated: Secondary | ICD-10-CM

## 2024-03-24 ENCOUNTER — Ambulatory Visit
Admission: RE | Admit: 2024-03-24 | Discharge: 2024-03-24 | Disposition: A | Source: Ambulatory Visit | Attending: Physician Assistant | Admitting: Physician Assistant

## 2024-03-24 DIAGNOSIS — F17210 Nicotine dependence, cigarettes, uncomplicated: Secondary | ICD-10-CM | POA: Diagnosis not present

## 2024-03-25 DIAGNOSIS — Z1212 Encounter for screening for malignant neoplasm of rectum: Secondary | ICD-10-CM | POA: Diagnosis not present

## 2024-03-25 DIAGNOSIS — Z1211 Encounter for screening for malignant neoplasm of colon: Secondary | ICD-10-CM | POA: Diagnosis not present

## 2024-04-14 ENCOUNTER — Encounter: Payer: BC Managed Care – PPO | Admitting: Internal Medicine

## 2024-04-22 ENCOUNTER — Encounter: Payer: BC Managed Care – PPO | Admitting: Internal Medicine

## 2024-04-28 ENCOUNTER — Encounter: Payer: BC Managed Care – PPO | Admitting: Internal Medicine
